# Patient Record
Sex: Female | Born: 1955
Health system: Southern US, Community
[De-identification: ages and names within clinical notes are randomized; demographics above are authoritative.]

## PROBLEM LIST (undated history)

## (undated) DIAGNOSIS — I10 Essential (primary) hypertension: Secondary | ICD-10-CM

## (undated) DIAGNOSIS — E039 Hypothyroidism, unspecified: Secondary | ICD-10-CM

## (undated) DIAGNOSIS — H269 Unspecified cataract: Secondary | ICD-10-CM

## (undated) DIAGNOSIS — F419 Anxiety disorder, unspecified: Secondary | ICD-10-CM

## (undated) DIAGNOSIS — M1990 Unspecified osteoarthritis, unspecified site: Secondary | ICD-10-CM

## (undated) DIAGNOSIS — E079 Disorder of thyroid, unspecified: Secondary | ICD-10-CM

## (undated) DIAGNOSIS — C801 Malignant (primary) neoplasm, unspecified: Secondary | ICD-10-CM

## (undated) DIAGNOSIS — I480 Paroxysmal atrial fibrillation: Secondary | ICD-10-CM

## (undated) DIAGNOSIS — Z975 Presence of (intrauterine) contraceptive device: Secondary | ICD-10-CM

## (undated) DIAGNOSIS — K5090 Crohn's disease, unspecified, without complications: Secondary | ICD-10-CM

## (undated) DIAGNOSIS — K219 Gastro-esophageal reflux disease without esophagitis: Secondary | ICD-10-CM

## (undated) DIAGNOSIS — N6092 Unspecified benign mammary dysplasia of left breast: Secondary | ICD-10-CM

## (undated) DIAGNOSIS — D649 Anemia, unspecified: Secondary | ICD-10-CM

## (undated) HISTORY — DX: Essential (primary) hypertension: I10

## (undated) HISTORY — PX: COLONOSCOPY: SHX174

## (undated) HISTORY — DX: Anemia, unspecified: D64.9

## (undated) HISTORY — DX: Presence of (intrauterine) contraceptive device: Z97.5

## (undated) HISTORY — DX: Unspecified benign mammary dysplasia of left breast: N60.92

## (undated) HISTORY — DX: Crohn's disease, unspecified, without complications: K50.90

## (undated) HISTORY — DX: Disorder of thyroid, unspecified: E07.9

## (undated) HISTORY — PX: OTHER SURGICAL HISTORY: SHX169

## (undated) HISTORY — DX: Malignant (primary) neoplasm, unspecified: C80.1

## (undated) HISTORY — DX: Unspecified cataract: H26.9

## (undated) HISTORY — DX: Gastro-esophageal reflux disease without esophagitis: K21.9

## (undated) HISTORY — DX: Paroxysmal atrial fibrillation: I48.0

## (undated) HISTORY — DX: Unspecified osteoarthritis, unspecified site: M19.90

## (undated) HISTORY — DX: Anxiety disorder, unspecified: F41.9

---

## 1981-02-25 DIAGNOSIS — Z975 Presence of (intrauterine) contraceptive device: Secondary | ICD-10-CM

## 1981-02-25 HISTORY — DX: Presence of (intrauterine) contraceptive device: Z97.5

## 1997-08-19 ENCOUNTER — Other Ambulatory Visit: Admission: RE | Admit: 1997-08-19 | Discharge: 1997-08-19 | Payer: Self-pay | Admitting: Obstetrics and Gynecology

## 1997-08-26 ENCOUNTER — Ambulatory Visit (HOSPITAL_COMMUNITY): Admission: RE | Admit: 1997-08-26 | Discharge: 1997-08-26 | Payer: Self-pay | Admitting: Obstetrics and Gynecology

## 1998-08-17 ENCOUNTER — Ambulatory Visit (HOSPITAL_COMMUNITY): Admission: RE | Admit: 1998-08-17 | Discharge: 1998-08-17 | Payer: Self-pay | Admitting: Obstetrics and Gynecology

## 1998-10-24 ENCOUNTER — Other Ambulatory Visit: Admission: RE | Admit: 1998-10-24 | Discharge: 1998-10-24 | Payer: Self-pay | Admitting: Obstetrics and Gynecology

## 1999-08-20 ENCOUNTER — Ambulatory Visit (HOSPITAL_COMMUNITY): Admission: RE | Admit: 1999-08-20 | Discharge: 1999-08-20 | Payer: Self-pay | Admitting: Obstetrics and Gynecology

## 1999-08-20 ENCOUNTER — Encounter: Payer: Self-pay | Admitting: Obstetrics and Gynecology

## 1999-12-20 ENCOUNTER — Other Ambulatory Visit: Admission: RE | Admit: 1999-12-20 | Discharge: 1999-12-20 | Payer: Self-pay | Admitting: Obstetrics and Gynecology

## 2000-03-07 ENCOUNTER — Emergency Department (HOSPITAL_COMMUNITY): Admission: EM | Admit: 2000-03-07 | Discharge: 2000-03-07 | Payer: Self-pay | Admitting: Emergency Medicine

## 2000-08-21 ENCOUNTER — Ambulatory Visit (HOSPITAL_COMMUNITY): Admission: RE | Admit: 2000-08-21 | Discharge: 2000-08-21 | Payer: Self-pay | Admitting: Obstetrics and Gynecology

## 2000-08-21 ENCOUNTER — Encounter: Payer: Self-pay | Admitting: Obstetrics and Gynecology

## 2001-01-20 ENCOUNTER — Other Ambulatory Visit: Admission: RE | Admit: 2001-01-20 | Discharge: 2001-01-20 | Payer: Self-pay | Admitting: Obstetrics and Gynecology

## 2001-08-24 ENCOUNTER — Encounter: Payer: Self-pay | Admitting: Obstetrics and Gynecology

## 2001-08-24 ENCOUNTER — Ambulatory Visit (HOSPITAL_COMMUNITY): Admission: RE | Admit: 2001-08-24 | Discharge: 2001-08-24 | Payer: Self-pay | Admitting: Obstetrics and Gynecology

## 2002-02-10 ENCOUNTER — Encounter: Payer: Self-pay | Admitting: Obstetrics and Gynecology

## 2002-02-10 ENCOUNTER — Encounter: Admission: RE | Admit: 2002-02-10 | Discharge: 2002-02-10 | Payer: Self-pay | Admitting: Obstetrics and Gynecology

## 2002-04-22 ENCOUNTER — Other Ambulatory Visit: Admission: RE | Admit: 2002-04-22 | Discharge: 2002-04-22 | Payer: Self-pay | Admitting: Obstetrics and Gynecology

## 2002-09-07 ENCOUNTER — Encounter: Payer: Self-pay | Admitting: Obstetrics and Gynecology

## 2002-09-07 ENCOUNTER — Ambulatory Visit (HOSPITAL_COMMUNITY): Admission: RE | Admit: 2002-09-07 | Discharge: 2002-09-07 | Payer: Self-pay | Admitting: Obstetrics and Gynecology

## 2002-09-08 ENCOUNTER — Encounter: Payer: Self-pay | Admitting: Obstetrics and Gynecology

## 2002-09-08 ENCOUNTER — Encounter: Admission: RE | Admit: 2002-09-08 | Discharge: 2002-09-08 | Payer: Self-pay | Admitting: Obstetrics and Gynecology

## 2003-05-03 ENCOUNTER — Other Ambulatory Visit: Admission: RE | Admit: 2003-05-03 | Discharge: 2003-05-03 | Payer: Self-pay | Admitting: Obstetrics and Gynecology

## 2003-05-26 ENCOUNTER — Encounter (INDEPENDENT_AMBULATORY_CARE_PROVIDER_SITE_OTHER): Payer: Self-pay | Admitting: Specialist

## 2003-05-26 ENCOUNTER — Ambulatory Visit (HOSPITAL_COMMUNITY): Admission: RE | Admit: 2003-05-26 | Discharge: 2003-05-26 | Payer: Self-pay | Admitting: Gastroenterology

## 2003-09-09 ENCOUNTER — Ambulatory Visit (HOSPITAL_COMMUNITY): Admission: RE | Admit: 2003-09-09 | Discharge: 2003-09-09 | Payer: Self-pay | Admitting: Obstetrics and Gynecology

## 2004-05-11 ENCOUNTER — Other Ambulatory Visit: Admission: RE | Admit: 2004-05-11 | Discharge: 2004-05-11 | Payer: Self-pay | Admitting: Obstetrics and Gynecology

## 2004-09-10 ENCOUNTER — Ambulatory Visit (HOSPITAL_COMMUNITY): Admission: RE | Admit: 2004-09-10 | Discharge: 2004-09-10 | Payer: Self-pay | Admitting: Obstetrics and Gynecology

## 2005-06-13 ENCOUNTER — Other Ambulatory Visit: Admission: RE | Admit: 2005-06-13 | Discharge: 2005-06-13 | Payer: Self-pay | Admitting: Obstetrics and Gynecology

## 2005-08-29 ENCOUNTER — Encounter: Admission: RE | Admit: 2005-08-29 | Discharge: 2005-08-29 | Payer: Self-pay | Admitting: Internal Medicine

## 2005-08-29 ENCOUNTER — Encounter (INDEPENDENT_AMBULATORY_CARE_PROVIDER_SITE_OTHER): Payer: Self-pay | Admitting: Specialist

## 2005-08-29 ENCOUNTER — Other Ambulatory Visit: Admission: RE | Admit: 2005-08-29 | Discharge: 2005-08-29 | Payer: Self-pay | Admitting: Interventional Radiology

## 2005-09-05 ENCOUNTER — Encounter (HOSPITAL_COMMUNITY): Admission: AD | Admit: 2005-09-05 | Discharge: 2005-11-06 | Payer: Self-pay | Admitting: Internal Medicine

## 2005-10-08 ENCOUNTER — Ambulatory Visit (HOSPITAL_COMMUNITY): Admission: RE | Admit: 2005-10-08 | Discharge: 2005-10-08 | Payer: Self-pay | Admitting: Obstetrics and Gynecology

## 2006-03-11 ENCOUNTER — Ambulatory Visit (HOSPITAL_COMMUNITY): Admission: RE | Admit: 2006-03-11 | Discharge: 2006-03-11 | Payer: Self-pay | Admitting: Surgery

## 2006-06-17 ENCOUNTER — Other Ambulatory Visit: Admission: RE | Admit: 2006-06-17 | Discharge: 2006-06-17 | Payer: Self-pay | Admitting: Obstetrics and Gynecology

## 2006-10-13 ENCOUNTER — Ambulatory Visit (HOSPITAL_COMMUNITY): Admission: RE | Admit: 2006-10-13 | Discharge: 2006-10-13 | Payer: Self-pay | Admitting: Obstetrics and Gynecology

## 2006-10-19 ENCOUNTER — Emergency Department (HOSPITAL_COMMUNITY): Admission: EM | Admit: 2006-10-19 | Discharge: 2006-10-19 | Payer: Self-pay | Admitting: Emergency Medicine

## 2007-06-05 ENCOUNTER — Emergency Department (HOSPITAL_COMMUNITY): Admission: EM | Admit: 2007-06-05 | Discharge: 2007-06-05 | Payer: Self-pay | Admitting: Emergency Medicine

## 2007-06-15 ENCOUNTER — Encounter: Admission: RE | Admit: 2007-06-15 | Discharge: 2007-06-15 | Payer: Self-pay | Admitting: Orthopedic Surgery

## 2007-06-22 ENCOUNTER — Other Ambulatory Visit: Admission: RE | Admit: 2007-06-22 | Discharge: 2007-06-22 | Payer: Self-pay | Admitting: Obstetrics and Gynecology

## 2007-10-14 ENCOUNTER — Ambulatory Visit (HOSPITAL_COMMUNITY): Admission: RE | Admit: 2007-10-14 | Discharge: 2007-10-14 | Payer: Self-pay | Admitting: Obstetrics and Gynecology

## 2007-11-05 ENCOUNTER — Ambulatory Visit: Payer: Self-pay | Admitting: Obstetrics and Gynecology

## 2007-11-17 ENCOUNTER — Encounter: Admission: RE | Admit: 2007-11-17 | Discharge: 2007-11-17 | Payer: Self-pay | Admitting: Obstetrics and Gynecology

## 2008-01-29 ENCOUNTER — Ambulatory Visit: Payer: Self-pay | Admitting: Obstetrics and Gynecology

## 2008-06-23 ENCOUNTER — Ambulatory Visit: Payer: Self-pay | Admitting: Obstetrics and Gynecology

## 2008-06-23 ENCOUNTER — Other Ambulatory Visit: Admission: RE | Admit: 2008-06-23 | Discharge: 2008-06-23 | Payer: Self-pay | Admitting: Obstetrics and Gynecology

## 2008-06-23 ENCOUNTER — Encounter: Payer: Self-pay | Admitting: Obstetrics and Gynecology

## 2008-10-19 ENCOUNTER — Ambulatory Visit (HOSPITAL_COMMUNITY): Admission: RE | Admit: 2008-10-19 | Discharge: 2008-10-19 | Payer: Self-pay | Admitting: Obstetrics and Gynecology

## 2009-07-05 ENCOUNTER — Ambulatory Visit: Payer: Self-pay | Admitting: Obstetrics and Gynecology

## 2009-07-05 ENCOUNTER — Other Ambulatory Visit: Admission: RE | Admit: 2009-07-05 | Discharge: 2009-07-05 | Payer: Self-pay | Admitting: Obstetrics and Gynecology

## 2009-11-01 ENCOUNTER — Ambulatory Visit (HOSPITAL_COMMUNITY): Admission: RE | Admit: 2009-11-01 | Discharge: 2009-11-01 | Payer: Self-pay | Admitting: Obstetrics and Gynecology

## 2010-07-13 NOTE — Op Note (Signed)
NAME:  Robin [name], Robin [name]                          ACCOUNT NO.:  [id number]   MEDICAL RECORD NO.:  [id number]                   PATIENT TYPE:  AMB   LOCATION:  ENDO                                 FACILITY:  [facility]   PHYSICIAN:  Earle Gell, M.D.                DATE OF BIRTH:  1955-06-17   DATE OF PROCEDURE:  05/26/2003  DATE OF DISCHARGE:                                 OPERATIVE REPORT   PROCEDURE:  Screening colonoscopy.   PROCEDURE INDICATION:  Ms. Tehila Sokolow has chronic Crohn's ileocolitis  which is symptomatically in remission.  Her last colonoscopy was in 1994,  revealing generalized Crohn's colitis and distal ileitis.   ENDOSCOPIST:  Garlan Fair, M.D.   PREMEDICATION:  1. Versed 10 mg.  2. Demerol 70 mcg.   DESCRIPTION OF PROCEDURE:  After obtaining informed consent, Ms. Wynetta Emery was  placed in the left lateral decubitus position.  I administered intravenous  Demerol and intravenous Versed to achieve conscious sedation for the  procedure.  The patient's blood pressure, oxygen saturation, and cardiac  rhythm were monitored throughout the procedure and documented in the medical  record.   Anal inspection and digital rectal exam were normal.  The Olympus adjustable  pediatric colonoscope was introduced into the rectum and advanced to the  cecum.  The ileocecal valve was intubated and the distal ileum inspected.  Colonic preparation for the exam today was excellent.   Mucosa from the rectum to the hepatic flexure is unremarkable.  There are no  neoplastic-appearing lesions.  The mucosa has the appearance of chronic,  inactive Crohn's colitis.   There are scattered aphthous ulcers involving the ascending colon and cecum  which is mildly inflamed at best.  The ileocecal valve was normal and widely  patent.  The distal ileum appears normal.   Surveillance biopsies.  Approximately 30 biopsies were taken along the  length of the colon and rectum and sent to pathologist  to rule out  dysplasia.                                               Earle Gell, M.D.   MJ/MEDQ  D:  05/26/2003  T:  05/26/2003  Job:  [id number]   cc:   Nehemiah Settle, M.D.  [address]  [place] [address]  Fax: [phone]

## 2010-07-20 ENCOUNTER — Encounter (INDEPENDENT_AMBULATORY_CARE_PROVIDER_SITE_OTHER): Payer: Self-pay | Admitting: Surgery

## 2010-07-25 ENCOUNTER — Encounter (INDEPENDENT_AMBULATORY_CARE_PROVIDER_SITE_OTHER): Payer: BC Managed Care – PPO | Admitting: Obstetrics and Gynecology

## 2010-07-25 ENCOUNTER — Other Ambulatory Visit (HOSPITAL_COMMUNITY)
Admission: RE | Admit: 2010-07-25 | Discharge: 2010-07-25 | Disposition: A | Discharge status: Home / Self Care | Payer: BC Managed Care – PPO | Source: Ambulatory Visit | Attending: Obstetrics and Gynecology | Admitting: Obstetrics and Gynecology

## 2010-07-25 ENCOUNTER — Other Ambulatory Visit: Payer: Self-pay | Admitting: Obstetrics and Gynecology

## 2010-07-25 DIAGNOSIS — Z124 Encounter for screening for malignant neoplasm of cervix: Secondary | ICD-10-CM | POA: Insufficient documentation

## 2010-07-25 DIAGNOSIS — Z01419 Encounter for gynecological examination (general) (routine) without abnormal findings: Secondary | ICD-10-CM

## 2010-08-15 ENCOUNTER — Other Ambulatory Visit (INDEPENDENT_AMBULATORY_CARE_PROVIDER_SITE_OTHER): Payer: BC Managed Care – PPO

## 2010-08-15 DIAGNOSIS — M810 Age-related osteoporosis without current pathological fracture: Secondary | ICD-10-CM

## 2010-08-31 ENCOUNTER — Other Ambulatory Visit: Payer: BC Managed Care – PPO

## 2010-10-02 ENCOUNTER — Other Ambulatory Visit: Payer: Self-pay | Admitting: Obstetrics and Gynecology

## 2010-10-02 DIAGNOSIS — Z1231 Encounter for screening mammogram for malignant neoplasm of breast: Secondary | ICD-10-CM

## 2010-11-28 ENCOUNTER — Ambulatory Visit (HOSPITAL_COMMUNITY)
Admission: RE | Admit: 2010-11-28 | Discharge: 2010-11-28 | Disposition: A | Discharge status: Home / Self Care | Payer: BC Managed Care – PPO | Source: Ambulatory Visit | Attending: Obstetrics and Gynecology | Admitting: Obstetrics and Gynecology

## 2010-11-28 DIAGNOSIS — Z1231 Encounter for screening mammogram for malignant neoplasm of breast: Secondary | ICD-10-CM | POA: Insufficient documentation

## 2011-02-21 ENCOUNTER — Other Ambulatory Visit: Payer: Self-pay | Admitting: *Deleted

## 2011-02-21 DIAGNOSIS — M810 Age-related osteoporosis without current pathological fracture: Secondary | ICD-10-CM

## 2011-02-21 MED ORDER — DENOSUMAB 60 MG/ML ~~LOC~~ SOLN
60.0000 mg | Freq: Once | SUBCUTANEOUS | Status: AC
  Administered 2011-02-22: 60 mg via SUBCUTANEOUS

## 2011-02-22 ENCOUNTER — Ambulatory Visit (INDEPENDENT_AMBULATORY_CARE_PROVIDER_SITE_OTHER): Payer: BC Managed Care – PPO | Admitting: Anesthesiology

## 2011-02-22 DIAGNOSIS — M810 Age-related osteoporosis without current pathological fracture: Secondary | ICD-10-CM

## 2011-02-27 ENCOUNTER — Other Ambulatory Visit (INDEPENDENT_AMBULATORY_CARE_PROVIDER_SITE_OTHER): Payer: Self-pay | Admitting: Surgery

## 2011-02-27 DIAGNOSIS — E042 Nontoxic multinodular goiter: Secondary | ICD-10-CM

## 2011-07-19 ENCOUNTER — Encounter: Payer: Self-pay | Admitting: Gynecology

## 2011-07-19 DIAGNOSIS — M810 Age-related osteoporosis without current pathological fracture: Secondary | ICD-10-CM | POA: Insufficient documentation

## 2011-07-29 ENCOUNTER — Ambulatory Visit (INDEPENDENT_AMBULATORY_CARE_PROVIDER_SITE_OTHER): Payer: BC Managed Care – PPO | Admitting: Obstetrics and Gynecology

## 2011-07-29 ENCOUNTER — Encounter: Payer: Self-pay | Admitting: Obstetrics and Gynecology

## 2011-07-29 VITALS — BP 122/78 | Ht 65.5 in | Wt 150.0 lb

## 2011-07-29 DIAGNOSIS — Z01419 Encounter for gynecological examination (general) (routine) without abnormal findings: Secondary | ICD-10-CM

## 2011-07-29 DIAGNOSIS — D649 Anemia, unspecified: Secondary | ICD-10-CM

## 2011-07-29 LAB — LIPID PANEL
Cholesterol: 129 mg/dL (ref 0–200)
Total CHOL/HDL Ratio: 2.6 Ratio
Triglycerides: 79 mg/dL (ref <150)
VLDL: 16 mg/dL (ref 0–40)

## 2011-07-29 LAB — CBC WITH DIFFERENTIAL/PLATELET
Basophils Relative: 1 % (ref 0–1)
Hemoglobin: 11.4 g/dL — ABNORMAL LOW (ref 12.0–15.0)
Lymphs Abs: 2.8 10*3/uL (ref 0.7–4.0)
Monocytes Relative: 8 % (ref 3–12)
Neutro Abs: 4.9 10*3/uL (ref 1.7–7.7)
Neutrophils Relative %: 56 % (ref 43–77)
RBC: 4.11 MIL/uL (ref 3.87–5.11)
WBC: 8.8 10*3/uL (ref 4.0–10.5)

## 2011-07-29 NOTE — Progress Notes (Signed)
Patient came to see me today for her annual GYN exam. She now has had 2 Prolia injections without side effects except for possibly a skin rash. She will discuss it with her dermatologist. She is doing well in terms of vaginal dryness without her estrogen cream. Her husband however is having trouble with the ED. She is having no vaginal bleeding. She is having no pelvic pain. She is having no bladder problems. She is up-to-date on her mammograms and colonoscopy.  Physical examination: Robin [name] present. HEENT within normal limits. Neck: Thyroid not large. No masses. Supraclavicular nodes: not enlarged. Breasts: Examined in both sitting and lying  position. No skin changes and no masses. Abdomen: Soft no guarding rebound or masses or hernia. Pelvic: External: Within normal limits. BUS: Within normal limits. Vaginal:within normal limits. Good estrogen effect. No evidence of cystocele rectocele or enterocele. Cervix: clean. Uterus: Normal size and shape. Adnexa: No masses. Rectovaginal exam: Confirmatory and negative. Extremities: Within normal limits.  Assessment: Osteoporosis  Plan: Continue Prolia. 4 months after next Prolia return for bone density. Continue yearly mammograms.

## 2011-07-30 LAB — URINALYSIS W MICROSCOPIC + REFLEX CULTURE
Bilirubin Urine: NEGATIVE
Glucose, UA: NEGATIVE mg/dL
Hgb urine dipstick: NEGATIVE
Nitrite: NEGATIVE
Protein, ur: NEGATIVE mg/dL
Squamous Epithelial / LPF: NONE SEEN

## 2011-07-30 NOTE — Progress Notes (Signed)
Addended by: Fredric Mare on: 07/30/2011 11:42 AM   Modules accepted: Orders

## 2011-07-31 ENCOUNTER — Other Ambulatory Visit: Payer: BC Managed Care – PPO

## 2011-07-31 ENCOUNTER — Telehealth: Payer: Self-pay | Admitting: *Deleted

## 2011-07-31 DIAGNOSIS — D649 Anemia, unspecified: Secondary | ICD-10-CM

## 2011-07-31 NOTE — Telephone Encounter (Signed)
Lm for patient to call.  Prolia $25 copay due after 08/23/11.

## 2011-08-01 ENCOUNTER — Other Ambulatory Visit: Payer: Self-pay | Admitting: *Deleted

## 2011-08-01 DIAGNOSIS — M810 Age-related osteoporosis without current pathological fracture: Secondary | ICD-10-CM

## 2011-08-01 LAB — CBC WITH DIFFERENTIAL/PLATELET
Basophils Relative: 1 % (ref 0–1)
HCT: 37.7 % (ref 36.0–46.0)
Hemoglobin: 12 g/dL (ref 12.0–15.0)
Lymphs Abs: 2.4 10*3/uL (ref 0.7–4.0)
MCHC: 31.8 g/dL (ref 30.0–36.0)
Monocytes Absolute: 0.6 10*3/uL (ref 0.1–1.0)
Monocytes Relative: 8 % (ref 3–12)
Neutro Abs: 4.3 10*3/uL (ref 1.7–7.7)
RBC: 4.27 MIL/uL (ref 3.87–5.11)

## 2011-08-01 LAB — VITAMIN B6

## 2011-08-01 LAB — FERRITIN: Ferritin: 60 ng/mL (ref 10–291)

## 2011-08-01 MED ORDER — DENOSUMAB 60 MG/ML ~~LOC~~ SOLN
60.0000 mg | Freq: Once | SUBCUTANEOUS | Status: AC
  Administered 2011-08-26: 60 mg via SUBCUTANEOUS

## 2011-08-01 NOTE — Telephone Encounter (Signed)
Patient informed. Will come for injection first week of July.

## 2011-08-02 ENCOUNTER — Telehealth: Payer: Self-pay | Admitting: *Deleted

## 2011-08-02 DIAGNOSIS — D649 Anemia, unspecified: Secondary | ICD-10-CM

## 2011-08-02 NOTE — Telephone Encounter (Signed)
Pt informed to have vitamin b6 lab drawn again due to not enough blood drawn first time per lab, pt will come on Tuesday 08/06/11 @ 8:30 am, order placed in computer.

## 2011-08-05 ENCOUNTER — Other Ambulatory Visit: Payer: BC Managed Care – PPO

## 2011-08-05 ENCOUNTER — Other Ambulatory Visit: Payer: Self-pay | Admitting: Dermatology

## 2011-08-06 ENCOUNTER — Other Ambulatory Visit: Payer: BC Managed Care – PPO

## 2011-08-07 ENCOUNTER — Other Ambulatory Visit: Payer: Self-pay | Admitting: *Deleted

## 2011-08-07 DIAGNOSIS — N390 Urinary tract infection, site not specified: Secondary | ICD-10-CM

## 2011-08-07 MED ORDER — NITROFURANTOIN MONOHYD MACRO 100 MG PO CAPS: 100.0000 mg | ORAL_CAPSULE | Freq: Two times a day (BID) | ORAL | Status: AC

## 2011-08-26 ENCOUNTER — Ambulatory Visit (INDEPENDENT_AMBULATORY_CARE_PROVIDER_SITE_OTHER): Payer: BC Managed Care – PPO | Admitting: Gynecology

## 2011-08-26 ENCOUNTER — Other Ambulatory Visit: Payer: BC Managed Care – PPO

## 2011-08-26 DIAGNOSIS — M810 Age-related osteoporosis without current pathological fracture: Secondary | ICD-10-CM

## 2011-08-26 DIAGNOSIS — N390 Urinary tract infection, site not specified: Secondary | ICD-10-CM

## 2011-08-27 LAB — URINALYSIS W MICROSCOPIC + REFLEX CULTURE
Bacteria, UA: NONE SEEN
Crystals: NONE SEEN
Hgb urine dipstick: NEGATIVE
Ketones, ur: NEGATIVE mg/dL
Nitrite: NEGATIVE
Protein, ur: NEGATIVE mg/dL
Specific Gravity, Urine: 1.022 (ref 1.005–1.030)
Urobilinogen, UA: 0.2 mg/dL (ref 0.0–1.0)

## 2011-08-29 LAB — URINE CULTURE: Colony Count: 10000

## 2011-08-30 ENCOUNTER — Other Ambulatory Visit: Payer: Self-pay | Admitting: *Deleted

## 2011-08-30 DIAGNOSIS — R3129 Other microscopic hematuria: Secondary | ICD-10-CM

## 2011-09-19 ENCOUNTER — Encounter (INDEPENDENT_AMBULATORY_CARE_PROVIDER_SITE_OTHER): Payer: Self-pay

## 2011-09-30 ENCOUNTER — Other Ambulatory Visit: Payer: BC Managed Care – PPO

## 2011-09-30 DIAGNOSIS — R3129 Other microscopic hematuria: Secondary | ICD-10-CM

## 2011-10-01 LAB — URINALYSIS W MICROSCOPIC + REFLEX CULTURE
Nitrite: NEGATIVE
Protein, ur: NEGATIVE mg/dL
Urobilinogen, UA: 0.2 mg/dL (ref 0.0–1.0)

## 2011-10-02 LAB — URINE CULTURE
Colony Count: NO GROWTH
Organism ID, Bacteria: NO GROWTH

## 2011-11-04 ENCOUNTER — Other Ambulatory Visit (INDEPENDENT_AMBULATORY_CARE_PROVIDER_SITE_OTHER): Payer: Self-pay | Admitting: Surgery

## 2011-11-05 ENCOUNTER — Telehealth (INDEPENDENT_AMBULATORY_CARE_PROVIDER_SITE_OTHER): Payer: Self-pay

## 2011-11-05 ENCOUNTER — Other Ambulatory Visit (INDEPENDENT_AMBULATORY_CARE_PROVIDER_SITE_OTHER): Payer: Self-pay

## 2011-11-05 DIAGNOSIS — E041 Nontoxic single thyroid nodule: Secondary | ICD-10-CM

## 2011-11-05 NOTE — Telephone Encounter (Signed)
LMOM received request for refill. Pt due for labs and TSH if not done. Last seen June 2012. Orders in epic.

## 2011-11-07 ENCOUNTER — Telehealth (INDEPENDENT_AMBULATORY_CARE_PROVIDER_SITE_OTHER): Payer: Self-pay

## 2011-11-07 NOTE — Telephone Encounter (Signed)
Refill synthroid authorized and set by electronic refill.

## 2011-11-15 ENCOUNTER — Other Ambulatory Visit: Payer: Self-pay | Admitting: Obstetrics and Gynecology

## 2011-11-15 DIAGNOSIS — Z1231 Encounter for screening mammogram for malignant neoplasm of breast: Secondary | ICD-10-CM

## 2011-11-25 ENCOUNTER — Ambulatory Visit
Admission: RE | Admit: 2011-11-25 | Discharge: 2011-11-25 | Disposition: A | Discharge status: Home / Self Care | Payer: BC Managed Care – PPO | Source: Ambulatory Visit | Attending: Surgery | Admitting: Surgery

## 2011-11-25 DIAGNOSIS — E042 Nontoxic multinodular goiter: Secondary | ICD-10-CM

## 2011-12-02 ENCOUNTER — Ambulatory Visit (INDEPENDENT_AMBULATORY_CARE_PROVIDER_SITE_OTHER): Payer: BC Managed Care – PPO | Admitting: Surgery

## 2011-12-02 ENCOUNTER — Encounter (INDEPENDENT_AMBULATORY_CARE_PROVIDER_SITE_OTHER): Payer: Self-pay | Admitting: Surgery

## 2011-12-02 VITALS — BP 120/72 | HR 84 | Temp 98.8°F | Resp 16 | Ht 65.0 in | Wt 140.8 lb

## 2011-12-02 DIAGNOSIS — E039 Hypothyroidism, unspecified: Secondary | ICD-10-CM | POA: Insufficient documentation

## 2011-12-02 DIAGNOSIS — D440 Neoplasm of uncertain behavior of thyroid gland: Secondary | ICD-10-CM | POA: Insufficient documentation

## 2011-12-02 DIAGNOSIS — D449 Neoplasm of uncertain behavior of unspecified endocrine gland: Secondary | ICD-10-CM

## 2011-12-02 NOTE — Patient Instructions (Signed)
Thyroid Diseases Your thyroid is a butterfly-shaped gland in your neck. It is located just above your collarbone. It is one of your endocrine glands, which make hormones. The thyroid helps set your metabolism. Metabolism is how your body gets energy from the foods you eat.  Millions of people have thyroid diseases. Women experience thyroid problems more often than men. In fact, overactive thyroid problems (hyperthyroidism) occur in 1% of all women. If you have a thyroid disease, your body may use energy more slowly or quickly than it should.  Thyroid problems also include an immune disease where your body reacts against your thyroid gland (called thyroiditis). A different problem involves lumps and bumps (called nodules) that develop in the gland. The nodules are usually, but not always, noncancerous. THE MOST COMMON THYROID PROBLEMS AND CAUSES ARE DISCUSSED BELOW There are many causes for thyroid problems. Treatment depends upon the exact diagnosis and includes trying to reset your body's metabolism to a normal rate. Hyperthyroidism Too much thyroid hormone from an overactive thyroid gland is called hyperthyroidism. In hyperthyroidism, the body's metabolism speeds up. One of the most frequent forms of hyperthyroidism is known as Graves' disease. Graves' disease tends to run in families. Although Graves' is thought to be caused by a problem with the immune system, the exact nature of the genetic problem is unknown. Hypothyroidism Too little thyroid hormone from an underactive thyroid gland is called hypothyroidism. In hypothyroidism, the body's metabolism is slowed. Several things can cause this condition. Most causes affect the thyroid gland directly and hurt its ability to make enough hormone.  Rarely, there may be a pituitary gland tumor (located near the base of the brain). The tumor can block the pituitary from producing thyroid-stimulating hormone (TSH). Your body makes TSH to stimulate the thyroid  to work properly. If the pituitary does not make enough TSH, the thyroid fails to make enough hormones needed for good health. Whether the problem is caused by thyroid conditions or by the pituitary gland, the result is that the thyroid is not making enough hormones. Hypothyroidism causes many physical and mental processes to become sluggish. The body consumes less oxygen and produces less body heat. Thyroid Nodules A thyroid nodule is a small swelling or lump in the thyroid gland. They are common. These nodules represent either a growth of thyroid tissue or a fluid-filled cyst. Both form a lump in the thyroid gland. Almost half of all people will have tiny thyroid nodules at some point in their lives. Typically, these are not noticeable until they become large and affect normal thyroid size. Larger nodules that are greater than a half inch across (about 1 centimeter) occur in about 5 percent of people. Although most nodules are not cancerous, people who have them should seek medical care to rule out cancer. Also, some thyroid nodules may produce too much thyroid hormone or become too large. Large nodules or a large gland can interfere with breathing or swallowing or may cause neck discomfort. Other problems Other thyroid problems include cancer and thyroiditis. Thyroiditis is a malfunction of the body's immune system. Normally, the immune system works to defend the body against infection and other problems. When the immune system is not working properly, it may mistakenly attack normal cells, tissues, and organs. Examples of autoimmune diseases are Hashimoto's thyroiditis (which causes low thyroid function) and Graves' disease (which causes excess thyroid function). SYMPTOMS  Symptoms vary greatly depending upon the exact type of problem with the thyroid. Hyperthyroidism-is when your thyroid is too  active and makes more thyroid hormone than your body needs. The most common cause is Graves' Disease. Too  much thyroid hormone can cause some or all of the following symptoms:  Anxiety.  Irritability.  Difficulty sleeping.  Fatigue.  A rapid or irregular heartbeat.  A fine tremor of your hands or fingers.  An increase in perspiration.  Sensitivity to heat.  Weight loss, despite normal food intake.  Brittle hair.  Enlargement of your thyroid gland (goiter).  Light menstrual periods.  Frequent bowel movements. Graves' disease can specifically cause eye and skin problems. The skin problems involve reddening and swelling of the skin, often on your shins and on the top of your feet. Eye problems can include the following:  Excess tearing and sensation of grit or sand in either or both eyes.  Reddened or inflamed eyes.  Widening of the space between your eyelids.  Swelling of the lids and tissues around the eyes.  Light sensitivity.  Ulcers on the cornea.  Double vision.  Limited eye movements.  Blurred or reduced vision. Hypothyroidism- is when your thyroid gland is not active enough. This is more common than hyperthyroidism. Symptoms can vary a lot depending of the severity of the hormone deficiency. Symptoms may develop over a long period of time and can include several of the following:  Fatigue.  Sluggishness.  Increased sensitivity to cold.  Constipation.  Pale, dry skin.  A puffy face.  Hoarse voice.  High blood cholesterol level.  Unexplained weight gain.  Muscle aches, tenderness and stiffness.  Pain, stiffness or swelling in your joints.  Muscle weakness.  Heavier than normal menstrual periods.  Brittle fingernails and hair.  Depression. Thyroid Nodules - most do not cause signs or symptoms. Occasionally, some may become so large that you can feel or even see the swelling at the base of your neck. You may realize a lump or swelling is there when you are shaving or putting on makeup. Men might become aware of a nodule when shirt collars  suddenly feel too tight. Some nodules produce too much thyroid hormone. This can produce the same symptoms as hyperthyroidism (see above). Thyroid nodules are seldom cancerous. However, a nodule is more likely to be malignant (cancerous) if it:  Grows quickly or feels hard.  Causes you to become hoarse or to have trouble swallowing or breathing.  Causes enlarged lymph nodes under your jaw or in your neck. DIAGNOSIS  Because there are so many possible thyroid conditions, your caregiver may ask for a number of tests. They will do this in order to narrow down the exact diagnosis. These tests can include:  Blood and antibody tests.  Special thyroid scans using small, safe amounts of radioactive iodine.  Ultrasound of the thyroid gland (particularly if there is a nodule or lump).  Biopsy. This is usually done with a special needle. A needle biopsy is a procedure to obtain a sample of cells from the thyroid. The tissue will be tested in a lab and examined under a microscope. TREATMENT  Treatment depends on the exact diagnosis. Hyperthyroidism  Beta-blockers help relieve many of the symptoms.  Anti-thyroid medications prevent the thyroid from making excess hormones.  Radioactive iodine treatment can destroy overactive thyroid cells. The iodine can permanently decrease the amount of hormone produced.  Surgery to remove the thyroid gland.  Treatments for eye problems that come from Graves' disease also include medications and special eye surgery, if felt to be appropriate. Hypothyroidism Thyroid replacement with levothyroxine is  the mainstay of treatment. Treatment with thyroid replacement is usually lifelong and will require monitoring and adjustment from time to time. Thyroid Nodules  Watchful waiting. If a small nodule causes no symptoms or signs of cancer on biopsy, then no treatment may be chosen at first. Re-exam and re-checking blood tests would be the recommended  follow-up.  Anti-thyroid medications or radioactive iodine treatment may be recommended if the nodules produce too much thyroid hormone (see Treatment for Hyperthyroidism above).  Alcohol ablation. Injections of small amounts of ethyl alcohol (ethanol) can cause a non-cancerous nodule to shrink in size.  Surgery (see Treatment for Hyperthyroidism above). HOME CARE INSTRUCTIONS   Take medications as instructed.  Follow through on recommended testing. SEEK MEDICAL CARE IF:   You feel that you are developing symptoms of Hyperthyroidism or Hypothyroidism as described above.  You develop a new lump/nodule in the neck/thyroid area that you had not noticed before.  You feel that you are having side effects from medicines prescribed.  You develop trouble breathing or swallowing. SEEK IMMEDIATE MEDICAL CARE IF:   You develop a fever of 102 F (38.9 C) or higher.  You develop severe sweating.  You develop palpitations and/or rapid heart beat.  You develop shortness of breath.  You develop nausea and vomiting.  You develop extreme shakiness.  You develop agitation.  You develop lightheadedness or have a fainting episode. Document Released: 12/09/2006 Document Revised: 05/06/2011 Document Reviewed: 12/09/2006 [facility] Patient Information 2013 [place].

## 2011-12-02 NOTE — Progress Notes (Signed)
General Surgery [facility]  Visit Diagnoses: 1. Neoplasm of uncertain behavior of thyroid gland, left lobe   2. Hypothyroidism     HISTORY: The patient is a 56 year old white female followed for many years with a dominant left thyroid nodule arising from a multinodular thyroid. She has undergone previous fine-needle aspiration biopsy which was benign.  At my request the patient underwent a followup thyroid ultrasound on 11/25/2011. This shows a dominant nodule in the left lobe measure 1.6 x 3.0 x 1.7 cm. This shows a minimal enlargement compared to her study in 2008 when the nodule measured 2.8 cm. There is cystic degeneration within the nodule. No calcifications are identified. Remainder of the thyroid shows small nodules all of which measure less than 1 cm in size.  Patient remains on Synthroid 75 mcg daily. A recent TSH level provided by her primary care physician is normal at 1.84.  PERTINENT REVIEW OF SYSTEMS: Denies tremor. Denies palpitation. Denies compressive symptoms. Denies any new masses or discomfort.  EXAM: HEENT: normocephalic; pupils equal and reactive; sclerae clear; dentition good; mucous membranes moist NECK:  Palpable soft nodule mid left thyroid lobe measuring approximately 2-3 cm in size; asymmetric on extension; no palpable anterior or posterior cervical lymphadenopathy; no supraclavicular masses; no tenderness CHEST: clear to auscultation bilaterally without rales, rhonchi, or wheezes CARDIAC: regular rate and rhythm without significant murmur; peripheral pulses are full EXT:  non-tender without edema; no deformity NEURO: no gross focal deficits; no sign of tremor   IMPRESSION: Dominant left thyroid nodule, 3.0 cm, clinically stable Hypothyroidism on Synthroid supplementation  PLAN: Patient and I discussed all of the above findings. At this point I see no reason for surgical intervention. We will continue to monitor the left thyroid nodule  with a followup ultrasound study of 1 year. I have reviewed her prescription for Synthroid for one year. We will see her back at that time for review of her ultrasound and TSH level.  Earnstine Regal, MD, [facility] Office: [phone]

## 2011-12-09 ENCOUNTER — Ambulatory Visit (HOSPITAL_COMMUNITY)
Admission: RE | Admit: 2011-12-09 | Discharge: 2011-12-09 | Disposition: A | Discharge status: Home / Self Care | Payer: BC Managed Care – PPO | Source: Ambulatory Visit | Attending: Obstetrics and Gynecology | Admitting: Obstetrics and Gynecology

## 2011-12-09 DIAGNOSIS — Z1231 Encounter for screening mammogram for malignant neoplasm of breast: Secondary | ICD-10-CM

## 2012-03-10 ENCOUNTER — Telehealth: Payer: Self-pay | Admitting: *Deleted

## 2012-03-10 NOTE — Telephone Encounter (Signed)
Pt informed that Prolia benefits given to patient. $25 copay. Apt 03/16/12 KW

## 2012-03-16 ENCOUNTER — Ambulatory Visit (INDEPENDENT_AMBULATORY_CARE_PROVIDER_SITE_OTHER): Payer: BC Managed Care – PPO | Admitting: Gynecology

## 2012-03-16 DIAGNOSIS — M810 Age-related osteoporosis without current pathological fracture: Secondary | ICD-10-CM

## 2012-03-16 MED ORDER — DENOSUMAB 60 MG/ML ~~LOC~~ SOLN
60.0000 mg | Freq: Once | SUBCUTANEOUS | Status: AC
  Administered 2012-03-16: 60 mg via SUBCUTANEOUS

## 2012-07-13 ENCOUNTER — Other Ambulatory Visit: Payer: Self-pay | Admitting: Dermatology

## 2012-09-21 ENCOUNTER — Encounter (INDEPENDENT_AMBULATORY_CARE_PROVIDER_SITE_OTHER): Payer: Self-pay

## 2012-09-22 ENCOUNTER — Telehealth: Payer: Self-pay | Admitting: *Deleted

## 2012-09-22 NOTE — Telephone Encounter (Signed)
I called pt time for Prolia. NO prior Auth required. Covered copay $25. Has AEX set for 8/18 with TF. She can receive shot that day if TF wants her to proceed with it. LM for pt to call back KW

## 2012-09-25 NOTE — Telephone Encounter (Signed)
Pt informed and its only a $25 copay has AEX scheduled and will get shot then. KW

## 2012-10-12 ENCOUNTER — Encounter: Payer: Self-pay | Admitting: Gynecology

## 2012-10-12 ENCOUNTER — Ambulatory Visit (INDEPENDENT_AMBULATORY_CARE_PROVIDER_SITE_OTHER): Payer: BC Managed Care – PPO | Admitting: Gynecology

## 2012-10-12 VITALS — BP 120/76 | Ht 65.0 in | Wt 142.0 lb

## 2012-10-12 DIAGNOSIS — M810 Age-related osteoporosis without current pathological fracture: Secondary | ICD-10-CM

## 2012-10-12 DIAGNOSIS — Z01419 Encounter for gynecological examination (general) (routine) without abnormal findings: Secondary | ICD-10-CM

## 2012-10-12 MED ORDER — DENOSUMAB 60 MG/ML ~~LOC~~ SOLN
60.0000 mg | Freq: Once | SUBCUTANEOUS | Status: AC
  Administered 2012-10-12: 60 mg via SUBCUTANEOUS

## 2012-10-12 NOTE — Addendum Note (Signed)
Addended by: Nelva Nay on: 10/12/2012 10:18 AM   Modules accepted: Orders

## 2012-10-12 NOTE — Patient Instructions (Signed)
Followup for bone density as scheduled. Otherwise followup in one year for annual exam.

## 2012-10-12 NOTE — Progress Notes (Signed)
Robin [name] June 02, 1955 [id number]        57 y.o.  G0P0 for annual exam.  Former patient of Dr. Cherylann Banas. Several issues noted below.  Past medical history,surgical history, medications, allergies, family history and social history were all reviewed and documented in the EPIC chart.  ROS:  Performed and pertinent positives and negatives are included in the history, assessment and plan .  Exam: [name] assistant Filed Vitals:   10/12/12 0846  BP: 120/76  Height: 5' 5"  (1.651 m)  Weight: 142 lb (64.411 kg)   General appearance  Normal Skin grossly normal Head/Neck normal with no cervical or supraclavicular adenopathy thyroid normal Lungs  clear Cardiac RR, without RMG Abdominal  soft, nontender, without masses, organomegaly or hernia Breasts  examined lying and sitting without masses, retractions, discharge or axillary adenopathy. Pelvic  Ext/BUS/vagina  normal with atrophic changes  Cervix  normal with atrophic changes  Uterus  anteverted, normal size, shape and contour, midline and mobile nontender   Adnexa  Without masses or tenderness    Anus and perineum  normal   Rectovaginal  normal sphincter tone without palpated masses or tenderness.    Assessment/Plan:  57 y.o. G0P0 female for annual exam.   1. Postmenopausal. Patient overall doing well without significant symptoms of hot flushes, night sweats, vaginal dryness or dyspareunia. Had thought about using vaginal estrogen in the past but never started this. She is comfortable with observation. She knows to report any vaginal bleeding. 2. IUD in place. Patient apparently has safety coil placed by Dr. Dorathy Kinsman 1983. Dr. Cherylann Banas attempted to retrieve under a paracervical block with cervical dilatation. He stated in his note that he could feel the IUD but unable to retrieve it. The plan was to leave it in place as long as she is not having any issues with this. I discussed the risks to include migration as well as infection. Given  that she is asymptomatic and overall doing well she is comfortable with observation and we'll plan expectant management. 3. Osteoporosis. DEXA 2008 with T score -2.6. Apparently had vertebral fracture with a fall over the last year or 2. Was started on Prolia by Dr. Cherylann Banas 2012 and she is into her second year. Risks of treatment to include osteonecrosis of the jaw, atypical fractures, rashes, infections all reviewed. Recommended she continue for now and get baseline DEXA now. Plan repeat DEXA in another 2-years and at some point consider drug-free holiday. Increase calcium and vitamin D reviewed. 4. Pap smear 2012. No Pap smear done today. No history of abnormal Pap smears previously. Plan repeat Pap smear next year at 3 year interval. 5. Mammography 11/2011. Patient knows to repeat mammogram this coming October. SBE monthly reviewed. 6. Colonoscopy 2012. Repeat at their recommended interval. 7. Health maintenance. No blood work done. She sees Dr. Maxwell Caul routinely and I recommended that he do all of her routine blood work and she agrees with this. Followup for DEXA otherwise one year, sooner as needed.  Note: This document was prepared with digital dictation and possible smart phrase technology. Any transcriptional errors that result from this process are unintentional.   Anastasio Auerbach MD, 9:33 AM 10/12/2012

## 2012-10-13 ENCOUNTER — Encounter: Payer: Self-pay | Admitting: Obstetrics and Gynecology

## 2012-10-13 LAB — URINALYSIS W MICROSCOPIC + REFLEX CULTURE
Crystals: NONE SEEN
Glucose, UA: NEGATIVE mg/dL
Leukocytes, UA: NEGATIVE
Protein, ur: NEGATIVE mg/dL
Specific Gravity, Urine: 1.01 (ref 1.005–1.030)
Squamous Epithelial / LPF: NONE SEEN
Urobilinogen, UA: 0.2 mg/dL (ref 0.0–1.0)

## 2012-12-07 ENCOUNTER — Other Ambulatory Visit (INDEPENDENT_AMBULATORY_CARE_PROVIDER_SITE_OTHER): Payer: Self-pay

## 2012-12-07 ENCOUNTER — Other Ambulatory Visit: Payer: Self-pay | Admitting: Gynecology

## 2012-12-07 DIAGNOSIS — Z1231 Encounter for screening mammogram for malignant neoplasm of breast: Secondary | ICD-10-CM

## 2012-12-07 DIAGNOSIS — E039 Hypothyroidism, unspecified: Secondary | ICD-10-CM

## 2012-12-07 MED ORDER — SYNTHROID 75 MCG PO TABS: 75.0000 ug | ORAL_TABLET | Freq: Every day | ORAL | Status: DC

## 2012-12-08 ENCOUNTER — Ambulatory Visit (INDEPENDENT_AMBULATORY_CARE_PROVIDER_SITE_OTHER): Payer: BC Managed Care – PPO

## 2012-12-08 ENCOUNTER — Encounter: Payer: Self-pay | Admitting: Gynecology

## 2012-12-08 DIAGNOSIS — M810 Age-related osteoporosis without current pathological fracture: Secondary | ICD-10-CM

## 2012-12-25 ENCOUNTER — Ambulatory Visit (HOSPITAL_COMMUNITY)
Admission: RE | Admit: 2012-12-25 | Discharge: 2012-12-25 | Disposition: A | Discharge status: Home / Self Care | Payer: BC Managed Care – PPO | Source: Ambulatory Visit | Attending: Gynecology | Admitting: Gynecology

## 2012-12-25 DIAGNOSIS — Z1231 Encounter for screening mammogram for malignant neoplasm of breast: Secondary | ICD-10-CM | POA: Insufficient documentation

## 2013-02-08 ENCOUNTER — Encounter (INDEPENDENT_AMBULATORY_CARE_PROVIDER_SITE_OTHER): Payer: Self-pay | Admitting: Surgery

## 2013-02-08 ENCOUNTER — Ambulatory Visit (INDEPENDENT_AMBULATORY_CARE_PROVIDER_SITE_OTHER): Payer: BC Managed Care – PPO | Admitting: Surgery

## 2013-02-08 ENCOUNTER — Encounter (INDEPENDENT_AMBULATORY_CARE_PROVIDER_SITE_OTHER): Payer: Self-pay

## 2013-02-08 ENCOUNTER — Telehealth (INDEPENDENT_AMBULATORY_CARE_PROVIDER_SITE_OTHER): Payer: Self-pay

## 2013-02-08 VITALS — BP 144/80 | HR 80 | Temp 98.6°F | Resp 16 | Ht 66.25 in | Wt 147.4 lb

## 2013-02-08 DIAGNOSIS — D449 Neoplasm of uncertain behavior of unspecified endocrine gland: Secondary | ICD-10-CM

## 2013-02-08 DIAGNOSIS — D440 Neoplasm of uncertain behavior of thyroid gland: Secondary | ICD-10-CM

## 2013-02-08 DIAGNOSIS — E039 Hypothyroidism, unspecified: Secondary | ICD-10-CM

## 2013-02-08 NOTE — Telephone Encounter (Signed)
Ultrasound scheduled and lab slip given. Order encounter to [facility].

## 2013-02-08 NOTE — Progress Notes (Signed)
General Surgery [facility]  Chief Complaint  Patient presents with  . Thyroid Nodule    long term follow-up exam    HISTORY: Patient is a 57 year old female followed for left-sided thyroid nodule. It has been a little over one year since her last evaluation. She continues to take Synthroid 75 mcg daily. She has not noted any change in self-examination.  PERTINENT REVIEW OF SYSTEMS: Denies tremors. Occasional palpitation. Denies compressive symptoms. Denies any new probable masses.  EXAM: HEENT: normocephalic; pupils equal and reactive; sclerae clear; dentition good; mucous membranes moist NECK:  Dominant nodule left thyroid lobe measuring approximately 3 cm in length, soft, mobile, nontender; right thyroid lobe without palpable dominant nodules; symmetric on extension; no palpable anterior or posterior cervical lymphadenopathy; no supraclavicular masses; no tenderness CHEST: clear to auscultation bilaterally without rales, rhonchi, or wheezes CARDIAC: regular rate and rhythm without significant murmur; peripheral pulses are full EXT:  non-tender without edema; no deformity NEURO: no gross focal deficits; no sign of tremor   IMPRESSION: Left thyroid nodule, clinically stable Hypothyroidism  PLAN: Patient will have a thyroid ultrasound performed and obtain a TSH level. We will contact her with the results. I will then read do her a prescription for Synthroid 75 mcg daily if her TSH level is between 1.0 and 2.0. If her ultrasound remains stable, I believe we can wait 2 years before we do a repeat examination.  We will contact the patient with her test results.  Earnstine Regal, MD, [facility] Office: [phone]  Visit Diagnoses: 1. Hypothyroidism   2. Neoplasm of uncertain behavior of thyroid gland, left lobe

## 2013-02-08 NOTE — Patient Instructions (Signed)
Thyroid-Stimulating Hormone The amount of thyroid-stimulating hormone (TSH) or thyrotropin can be measured from a sample of blood. The TSH level can help diagnose thyroid gland or pituitary gland disorders, or monitor treatment of hypothyroidism and hyperthyroidism. TSH is produced by the pituitary gland, a tiny organ located below the brain. The pituitary gland is part of the body's feedback system to maintain stable levels of thyroid hormones released into the blood. Thyroid hormones help control the rate at which the body uses energy. The pituitary gland monitors the level of thyroid hormones released by the thyroid gland. The thyroid gland is a small butterfly-shaped gland that lies flat against the windpipe. If the thyroid gland does not release enough thyroid hormones, the pituitary gland detects the reduced thyroid hormone levels. The pituitary gland then makes more TSH to trigger the thyroid gland to produce more thyroid hormones. This increase in TSH is an effort to return the low thyroid hormones to normal levels. The increased TSH level is caused by the low thyroid hormone levels of an underactive thyroid (hypothyroidism). Symptoms of hypothyroidism include menstrual irregularities in women, weight gain, dry skin, constipation, cold intolerance, and fatigue. Rarely, a high TSH level can indicate a problem with the pituitary gland. A high TSH level could also occur when there is an insufficient level of thyroid hormone medication in individuals receiving that medication. If the thyroid gland releases too much thyroid hormones, the pituitary gland detects the increased thyroid hormone levels. The pituitary gland then makes less TSH to slow the thyroid gland from producing thyroid hormones. This decrease in TSH is an effort to return the increased thyroid hormones to normal levels. The decreased TSH level is caused by the excess thyroid hormone levels of an overactive thyroid gland (hyperthyroidism).  Symptoms associated with hyperthyroidism include rapid heart rate, weight loss, nervousness, hand tremors, irritated eyes, and difficulty sleeping. Rarely, a low TSH level can indicate a problem with the pituitary gland. PREPARATION FOR TEST No specific preparation is required for this blood test. A blood sample is obtained from a needle placed in a vein in your arm or from pricking the heel of an infant.  NORMAL FINDINGS  Adult: 0.5-5 milli-international Units/L (0.5-5 mIU/L)  Newborn: 3-20 milli-international Units/L (3-20 mIU/L)  Cord: 3-12 milli-international Units/L (3-12 mIU/L) Ranges for normal findings may vary among different laboratories and hospitals. You should always check with your doctor after having lab work or other tests done to discuss the meaning of your test results and whether your values are considered within normal limits. MEANING OF TEST  Your caregiver will go over the test results with you and discuss the importance and meaning of your results, as well as treatment options and the need for additional tests if necessary. OBTAINING THE TEST RESULTS It is your responsibility to obtain your test results. Ask the lab or department performing the test when and how you will get your results. Document Released: 03/08/2004 Document Revised: 05/06/2011 Document Reviewed: 01/24/2008 [facility] Patient Information 2014 [place], [place].

## 2013-02-10 ENCOUNTER — Other Ambulatory Visit: Payer: BC Managed Care – PPO

## 2013-02-12 ENCOUNTER — Ambulatory Visit
Admission: RE | Admit: 2013-02-12 | Discharge: 2013-02-12 | Disposition: A | Discharge status: Home / Self Care | Payer: BC Managed Care – PPO | Source: Ambulatory Visit | Attending: Surgery | Admitting: Surgery

## 2013-02-12 DIAGNOSIS — D440 Neoplasm of uncertain behavior of thyroid gland: Secondary | ICD-10-CM

## 2013-02-12 DIAGNOSIS — E039 Hypothyroidism, unspecified: Secondary | ICD-10-CM

## 2013-02-12 LAB — TSH: TSH: 1.907 u[IU]/mL (ref 0.350–4.500)

## 2013-02-21 ENCOUNTER — Other Ambulatory Visit (INDEPENDENT_AMBULATORY_CARE_PROVIDER_SITE_OTHER): Payer: Self-pay | Admitting: Surgery

## 2013-02-21 DIAGNOSIS — E039 Hypothyroidism, unspecified: Secondary | ICD-10-CM

## 2013-02-21 MED ORDER — SYNTHROID 75 MCG PO TABS: 75.0000 ug | ORAL_TABLET | Freq: Every day | ORAL | Status: DC

## 2013-02-21 NOTE — Progress Notes (Signed)
Quick Note:  TSH level is normal at 1.907 with my goal range being 1.0 - 2.0.  Will renew her Rx for Synthroid 75 mcg daily.  Return for physical exam and review of ultrasound in 2 years.  Earnstine Regal, MD, [facility] Office: [phone]   ______

## 2013-02-21 NOTE — Progress Notes (Signed)
Quick Note:  Slight continued enlargement of dominant left sided nodule. No additional worrisome findings.  Will repeat ultrasound in 2 years.  Earnstine Regal, MD, [facility] Office: [phone]   ______

## 2013-02-22 ENCOUNTER — Telehealth (INDEPENDENT_AMBULATORY_CARE_PROVIDER_SITE_OTHER): Payer: Self-pay

## 2013-02-22 NOTE — Telephone Encounter (Signed)
LMOM for pt to call back. Per Dr Harlow Asa pt to be advised he has refilled her RX for synthroid 75 mcg to be continued. TSH level is 1.9 .

## 2013-02-22 NOTE — Telephone Encounter (Signed)
LMOM to call back. Per Dr Harlow Asa slight continued enlargement of dominant left thyroid nodule. No additional worrisome findings. Will repeat ultrasound in 2 years.

## 2013-02-22 NOTE — Telephone Encounter (Signed)
Pt called back and message was delivered.

## 2013-03-22 ENCOUNTER — Telehealth: Payer: Self-pay | Admitting: *Deleted

## 2013-03-22 ENCOUNTER — Other Ambulatory Visit: Payer: Managed Care, Other (non HMO)

## 2013-03-22 DIAGNOSIS — N390 Urinary tract infection, site not specified: Secondary | ICD-10-CM

## 2013-03-22 NOTE — Telephone Encounter (Signed)
Pt called c/o possible UTI burning with urination, pt asked if u/a could be left in office? Unable to make appointment.  Please advise

## 2013-03-22 NOTE — Telephone Encounter (Signed)
Order placed, pt will be in today

## 2013-03-22 NOTE — Telephone Encounter (Signed)
Okay to leave urinalysis

## 2013-03-23 LAB — URINALYSIS W MICROSCOPIC + REFLEX CULTURE
BACTERIA UA: NONE SEEN
Bilirubin Urine: NEGATIVE
CASTS: NONE SEEN
Glucose, UA: NEGATIVE mg/dL
HGB URINE DIPSTICK: NEGATIVE
KETONES UR: NEGATIVE mg/dL
Leukocytes, UA: NEGATIVE
Nitrite: NEGATIVE
PH: 5.5 (ref 5.0–8.0)
Protein, ur: NEGATIVE mg/dL
SPECIFIC GRAVITY, URINE: 1.025 (ref 1.005–1.030)
Squamous Epithelial / LPF: NONE SEEN
Urobilinogen, UA: 0.2 mg/dL (ref 0.0–1.0)

## 2013-03-26 ENCOUNTER — Telehealth: Payer: Self-pay | Admitting: *Deleted

## 2013-03-26 NOTE — Telephone Encounter (Signed)
Pt called requesting u/a results on 03/22/13,

## 2013-05-03 ENCOUNTER — Telehealth: Payer: Self-pay | Admitting: *Deleted

## 2013-05-03 NOTE — Telephone Encounter (Signed)
Yes appeal.  Reason is continuation of services currently being undertaken.

## 2013-05-03 NOTE — Telephone Encounter (Signed)
Pt has new insurance and it has denied her coverage for Prolia. She was started on Prolia in 2012. Does an appeal need to done? Let me know and I can start that process. [facility]

## 2013-05-10 NOTE — Telephone Encounter (Signed)
Appeal sent today to her insurance K[facility] CMA

## 2013-05-18 NOTE — Telephone Encounter (Signed)
Checked on Appeal to denied PA and still pending, Lm for pt to let her know. [facility]W CMA

## 2013-06-16 NOTE — Telephone Encounter (Signed)
LM for pt to call back approval for [employer] appeal. Confirmation # [id number] good for 1 year 04/30/14 - 04/30/14 KW CMA

## 2013-06-18 ENCOUNTER — Other Ambulatory Visit: Payer: Self-pay | Admitting: Internal Medicine

## 2013-06-18 ENCOUNTER — Ambulatory Visit
Admission: RE | Admit: 2013-06-18 | Discharge: 2013-06-18 | Disposition: A | Discharge status: Home / Self Care | Payer: Managed Care, Other (non HMO) | Source: Ambulatory Visit | Attending: Internal Medicine | Admitting: Internal Medicine

## 2013-06-18 DIAGNOSIS — R071 Chest pain on breathing: Secondary | ICD-10-CM

## 2013-06-24 NOTE — Telephone Encounter (Signed)
Pt informed and coming on 06/28/13 [facility] CMA

## 2013-06-28 ENCOUNTER — Ambulatory Visit (INDEPENDENT_AMBULATORY_CARE_PROVIDER_SITE_OTHER): Payer: Managed Care, Other (non HMO) | Admitting: Gynecology

## 2013-06-28 DIAGNOSIS — M810 Age-related osteoporosis without current pathological fracture: Secondary | ICD-10-CM

## 2013-06-28 MED ORDER — DENOSUMAB 60 MG/ML ~~LOC~~ SOLN
60.0000 mg | Freq: Once | SUBCUTANEOUS | Status: AC
  Administered 2013-06-28: 60 mg via SUBCUTANEOUS

## 2013-07-01 ENCOUNTER — Encounter (HOSPITAL_COMMUNITY)
Admission: RE | Disposition: A | Discharge status: Home / Self Care | Payer: Self-pay | Source: Ambulatory Visit | Attending: Gastroenterology

## 2013-07-01 ENCOUNTER — Other Ambulatory Visit: Payer: Self-pay | Admitting: Gastroenterology

## 2013-07-01 ENCOUNTER — Encounter (HOSPITAL_COMMUNITY): Payer: Self-pay | Admitting: *Deleted

## 2013-07-01 ENCOUNTER — Ambulatory Visit (HOSPITAL_COMMUNITY)
Admission: RE | Admit: 2013-07-01 | Discharge: 2013-07-01 | Disposition: A | Discharge status: Home / Self Care | Payer: Managed Care, Other (non HMO) | Source: Ambulatory Visit | Attending: Gastroenterology | Admitting: Gastroenterology

## 2013-07-01 DIAGNOSIS — K5010 Crohn's disease of large intestine without complications: Secondary | ICD-10-CM | POA: Insufficient documentation

## 2013-07-01 DIAGNOSIS — Z885 Allergy status to narcotic agent status: Secondary | ICD-10-CM | POA: Insufficient documentation

## 2013-07-01 DIAGNOSIS — E039 Hypothyroidism, unspecified: Secondary | ICD-10-CM | POA: Insufficient documentation

## 2013-07-01 DIAGNOSIS — K56609 Unspecified intestinal obstruction, unspecified as to partial versus complete obstruction: Secondary | ICD-10-CM | POA: Insufficient documentation

## 2013-07-01 DIAGNOSIS — J45909 Unspecified asthma, uncomplicated: Secondary | ICD-10-CM | POA: Insufficient documentation

## 2013-07-01 HISTORY — PX: COLONOSCOPY: SHX5424

## 2013-07-01 SURGERY — COLONOSCOPY
Anesthesia: Moderate Sedation

## 2013-07-01 MED ORDER — ONDANSETRON HCL 4 MG/2ML IJ SOLN
INTRAMUSCULAR | Status: AC
  Filled 2013-07-01: qty 2

## 2013-07-01 MED ORDER — FENTANYL CITRATE 0.05 MG/ML IJ SOLN
INTRAMUSCULAR | Status: AC
  Filled 2013-07-01: qty 2

## 2013-07-01 MED ORDER — MIDAZOLAM HCL 10 MG/2ML IJ SOLN
INTRAMUSCULAR | Status: AC
  Filled 2013-07-01: qty 2

## 2013-07-01 MED ORDER — SODIUM CHLORIDE 0.9 % IV SOLN
INTRAVENOUS | Status: DC
Start: 2013-07-01 — End: 2013-07-01
  Administered 2013-07-01: 500 mL via INTRAVENOUS

## 2013-07-01 MED ORDER — ONDANSETRON HCL 4 MG/2ML IJ SOLN
INTRAMUSCULAR | Status: DC | PRN
  Administered 2013-07-01: 4 mg via INTRAVENOUS

## 2013-07-01 MED ORDER — FENTANYL CITRATE 0.05 MG/ML IJ SOLN
INTRAMUSCULAR | Status: DC | PRN
  Administered 2013-07-01: 50 ug via INTRAVENOUS
  Administered 2013-07-01: 25 ug via INTRAVENOUS

## 2013-07-01 MED ORDER — MIDAZOLAM HCL 5 MG/5ML IJ SOLN
INTRAMUSCULAR | Status: DC | PRN
Start: 2013-07-01 — End: 2013-07-01
  Administered 2013-07-01 (×4): 2.5 mg via INTRAVENOUS

## 2013-07-01 NOTE — Discharge Instructions (Signed)
Colonoscopy °Care After °These instructions give you information on caring for yourself after your procedure. Your doctor may also give you more specific instructions. Call your doctor if you have any problems or questions after your procedure. °HOME CARE °· Take it easy for the next 24 hours. °· Rest. °· Walk or use warm packs on your belly (abdomen) if you have belly cramping or gas. °· Do not drive for 24 hours. °· You may shower. °· Do not sign important papers or use machinery for 24 hours. °· Drink enough fluids to keep your pee (urine) clear or pale yellow. °· Resume your normal diet. Avoid heavy or fried foods. °· Avoid alcohol. °· Continue taking your normal medicines. °· Only take medicine as told by your doctor. Do not take aspirin. °If you had growths (polyps) removed: °· Do not take aspirin. °· Do not drink alcohol for 7 days or as told by your doctor. °· Eat a soft diet for 24 hours. °GET HELP RIGHT AWAY IF: °· You have a fever. °· You pass clumps of tissue (blood clots) or fill the toilet with blood. °· You have belly pain that gets worse and medicine does not help. °· Your belly is puffy (swollen). °· You feel sick to your stomach (nauseous) or throw up (vomit). °MAKE SURE YOU: °· Understand these instructions. °· Will watch your condition. °· Will get help right away if you are not doing well or get worse. °Document Released: 03/16/2010 Document Revised: 05/06/2011 Document Reviewed: 10/19/2012 °ExitCare® Patient Information ©2014 [employer]. ° °Conscious Sedation, Adult, Care After °Refer to this sheet in the next few weeks. These instructions provide you with information on caring for yourself after your procedure. Your health care provider may also give you more specific instructions. Your treatment has been planned according to current medical practices, but problems sometimes occur. Call your health care provider if you have any problems or questions after your procedure. °WHAT TO EXPECT  AFTER THE PROCEDURE  °After your procedure: °· You may feel sleepy, clumsy, and have poor balance for several hours. °· Vomiting may occur if you eat too soon after the procedure. °HOME CARE INSTRUCTIONS °· Do not participate in any activities where you could become injured for at least 24 hours. Do not: °· Drive. °· Swim. °· Ride a bicycle. °· Operate heavy machinery. °· Cook. °· Use power tools. °· Climb ladders. °· Work from a high place. °· Do not make important decisions or sign legal documents until you are improved. °· If you vomit, drink water, juice, or soup when you can drink without vomiting. Make sure you have little or no nausea before eating solid foods. °· Only take over-the-counter or prescription medicines for pain, discomfort, or fever as directed by your health care provider. °· Make sure you and your family fully understand everything about the medicines given to you, including what side effects may occur. °· You should not drink alcohol, take sleeping pills, or take medicines that cause drowsiness for at least 24 hours. °· If you smoke, do not smoke without supervision. °· If you are feeling better, you may resume normal activities 24 hours after you were sedated. °· Keep all appointments with your health care provider. °SEEK MEDICAL CARE IF: °· Your skin is pale or bluish in color. °· You continue to feel nauseous or vomit. °· Your pain is getting worse and is not helped by medicine. °· You have bleeding or swelling. °· You are still sleepy or   feeling clumsy after 24 hours. °SEEK IMMEDIATE MEDICAL CARE IF: °· You develop a rash. °· You have difficulty breathing. °· You develop any type of allergic problem. °· You have a fever. °MAKE SURE YOU: °· Understand these instructions. °· Will watch your condition. °· Will get help right away if you are not doing well or get worse. °Document Released: 12/02/2012 Document Reviewed: 09/18/2012 °ExitCare® Patient Information ©2014 [employer]. ° ° °

## 2013-07-01 NOTE — H&P (Signed)
  Problems: Crohn's colitis, low-grade fever, microscopic hematuria with negative urine culture  History: The patient is a 58 year old female born 1955/12/03. She was diagnosed with Crohn's colitis in 1981 by full column barium enema. 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  The patient has developed left lower quadrant abdominal discomfort with low-grade fevers and intermittent diarrhea.  She underwent a diagnostic flexible proctosigmoidoscopy this morning which showed a Crohn's stricture in the sigmoid colon which I could not traverse with the pediatric colonoscope. The patient was transferred to [facility] endoscopy center to undergo a colonoscopy using the ultra thin colonoscope.  Past medical history: Crohn's ileocolitis since 1981 osteopenia. Thyroid nodule. Asthma. Hematuria with negative urological evaluation. Rectal fissurectomy. Basal cell skin cancers. Squamous cell skin cancers. Hypothyroidism.  Medication allergies: Codeine  Exam: The patient is alert and lying comfortably on the endoscopy stretcher. Lungs are clear to auscultation. Cardiac exam reveals a regular rhythm. Abdomen is soft and somewhat tender in the left lower quadrant exam.  Plan: Proceed with diagnostic colonoscopy using the ultra thin colonoscope

## 2013-07-01 NOTE — Op Note (Signed)
Problems: Crohn's colitis since 1981, low-grade fever  Endoscopist: Earle Gell  Premedication: Zofran 4 mg. Versed 10 mg. Fentanyl 75 mcg  Procedure: Diagnostic colonoscopy The patient was placed in the left lateral decubitus position. Anal inspection and digital rectal exam were normal. The Pentax pediatric colonoscope was introduced into the rectum and advanced to the cecum. A normal-appearing appendiceal orifice was identified. Colonic preparation for the exam today was good.  The patient has active Crohn's proctocolitis. Several deep ulcers with exudative bases were present in the rectum. Multiple areas of active Crohn's colitis were identified associated with deep mucosal ulcers and stricture formation in the sigmoid colon, transverse colon, and ascending colon. Areas of normal-appearing mucosa were identified as well as scarred colonic mucosa unassociated with active colitis. In the proximal ascending colon, which might be the ileocecal, was severely ulcerated and scarred. I was unable to traverse what I thought was the ileocecal valve and entered the terminal ileum.  Multiple biopsies were performed from the active Crohn's colitis associated with stricture formation.  Assessment: Patchy active Crohn's proctocolitis complicated by deep ulcers and stricture formation. Biopsies pending.

## 2013-07-02 ENCOUNTER — Encounter (HOSPITAL_COMMUNITY): Payer: Self-pay | Admitting: Gastroenterology

## 2013-09-28 ENCOUNTER — Other Ambulatory Visit (INDEPENDENT_AMBULATORY_CARE_PROVIDER_SITE_OTHER): Payer: Self-pay

## 2013-09-28 ENCOUNTER — Telehealth (INDEPENDENT_AMBULATORY_CARE_PROVIDER_SITE_OTHER): Payer: Self-pay

## 2013-09-28 DIAGNOSIS — E041 Nontoxic single thyroid nodule: Secondary | ICD-10-CM

## 2013-09-28 MED ORDER — SYNTHROID 75 MCG PO TABS: 75.0000 ug | ORAL_TABLET | Freq: Every day | ORAL | Status: AC

## 2013-09-28 NOTE — Telephone Encounter (Signed)
Refill synthroid 6mg # 90 with 3 refills [facility] Dr [facility]

## 2013-11-29 ENCOUNTER — Other Ambulatory Visit: Payer: Self-pay | Admitting: Gynecology

## 2013-11-29 DIAGNOSIS — Z1231 Encounter for screening mammogram for malignant neoplasm of breast: Secondary | ICD-10-CM

## 2013-12-16 ENCOUNTER — Encounter: Payer: Self-pay | Admitting: Gynecology

## 2013-12-16 ENCOUNTER — Other Ambulatory Visit (HOSPITAL_COMMUNITY)
Admission: RE | Admit: 2013-12-16 | Discharge: 2013-12-16 | Disposition: A | Discharge status: Home / Self Care | Payer: Managed Care, Other (non HMO) | Source: Ambulatory Visit | Attending: Gynecology | Admitting: Gynecology

## 2013-12-16 ENCOUNTER — Ambulatory Visit (INDEPENDENT_AMBULATORY_CARE_PROVIDER_SITE_OTHER): Payer: Managed Care, Other (non HMO) | Admitting: Gynecology

## 2013-12-16 VITALS — BP 120/76 | Ht 66.0 in | Wt 160.0 lb

## 2013-12-16 DIAGNOSIS — Z01419 Encounter for gynecological examination (general) (routine) without abnormal findings: Secondary | ICD-10-CM | POA: Insufficient documentation

## 2013-12-16 DIAGNOSIS — T8389XD Other specified complication of genitourinary prosthetic devices, implants and grafts, subsequent encounter: Secondary | ICD-10-CM

## 2013-12-16 DIAGNOSIS — Z1151 Encounter for screening for human papillomavirus (HPV): Secondary | ICD-10-CM | POA: Diagnosis present

## 2013-12-16 DIAGNOSIS — M810 Age-related osteoporosis without current pathological fracture: Secondary | ICD-10-CM

## 2013-12-16 DIAGNOSIS — N952 Postmenopausal atrophic vaginitis: Secondary | ICD-10-CM

## 2013-12-16 LAB — URINALYSIS W MICROSCOPIC + REFLEX CULTURE
Bacteria, UA: NONE SEEN
Bilirubin Urine: NEGATIVE
CASTS: NONE SEEN
Crystals: NONE SEEN
GLUCOSE, UA: NEGATIVE mg/dL
Ketones, ur: NEGATIVE mg/dL
Nitrite: NEGATIVE
Protein, ur: NEGATIVE mg/dL
SQUAMOUS EPITHELIAL / LPF: NONE SEEN
Specific Gravity, Urine: 1.007 (ref 1.005–1.030)
Urobilinogen, UA: 0.2 mg/dL (ref 0.0–1.0)
pH: 5.5 (ref 5.0–8.0)

## 2013-12-16 NOTE — Addendum Note (Signed)
Addended by: Nelva Nay on: 12/16/2013 09:18 AM   Modules accepted: Orders

## 2013-12-16 NOTE — Patient Instructions (Signed)
You may obtain a copy of any labs that were done today by logging onto MyChart as outlined in the instructions provided with your AVS (after visit summary). The office will not call with normal lab results but certainly if there are any significant abnormalities then we will contact you.   Health Maintenance, Female A healthy lifestyle and preventative care can promote health and wellness.  Maintain regular health, dental, and eye exams.  Eat a healthy diet. Foods like vegetables, fruits, whole grains, low-fat dairy products, and lean protein foods contain the nutrients you need without too many calories. Decrease your intake of foods high in solid fats, added sugars, and salt. Get information about a proper diet from your caregiver, if necessary.  Regular physical exercise is one of the most important things you can do for your health. Most adults should get at least 150 minutes of moderate-intensity exercise (any activity that increases your heart rate and causes you to sweat) each week. In addition, most adults need muscle-strengthening exercises on 2 or more days a week.   Maintain a healthy weight. The body mass index (BMI) is a screening tool to identify possible weight problems. It provides an estimate of body fat based on height and weight. Your caregiver can help determine your BMI, and can help you achieve or maintain a healthy weight. For adults 20 years and older:  A BMI below 18.5 is considered underweight.  A BMI of 18.5 to 24.9 is normal.  A BMI of 25 to 29.9 is considered overweight.  A BMI of 30 and above is considered obese.  Maintain normal blood lipids and cholesterol by exercising and minimizing your intake of saturated fat. Eat a balanced diet with plenty of fruits and vegetables. Blood tests for lipids and cholesterol should begin at age 61 and be repeated every 5 years. If your lipid or cholesterol levels are high, you are over 50, or you are a high risk for heart  disease, you may need your cholesterol levels checked more frequently.Ongoing high lipid and cholesterol levels should be treated with medicines if diet and exercise are not effective.  If you smoke, find out from your caregiver how to quit. If you do not use tobacco, do not start.  Lung cancer screening is recommended for adults aged 33 80 years who are at high risk for developing lung cancer because of a history of smoking. Yearly low-dose computed tomography (CT) is recommended for people who have at least a 30-pack-year history of smoking and are a current smoker or have quit within the past 15 years. A pack year of smoking is smoking an average of 1 pack of cigarettes a day for 1 year (for example: 1 pack a day for 30 years or 2 packs a day for 15 years). Yearly screening should continue until the smoker has stopped smoking for at least 15 years. Yearly screening should also be stopped for people who develop a health problem that would prevent them from having lung cancer treatment.  If you are pregnant, do not drink alcohol. If you are breastfeeding, be very cautious about drinking alcohol. If you are not pregnant and choose to drink alcohol, do not exceed 1 drink per day. One drink is considered to be 12 ounces (355 mL) of beer, 5 ounces (148 mL) of wine, or 1.5 ounces (44 mL) of liquor.  Avoid use of street drugs. Do not share needles with anyone. Ask for help if you need support or instructions about stopping  the use of drugs.  High blood pressure causes heart disease and increases the risk of stroke. Blood pressure should be checked at least every 1 to 2 years. Ongoing high blood pressure should be treated with medicines, if weight loss and exercise are not effective.  If you are 59 to 58 years old, ask your caregiver if you should take aspirin to prevent strokes.  Diabetes screening involves taking a blood sample to check your fasting blood sugar level. This should be done once every 3  years, after age 91, if you are within normal weight and without risk factors for diabetes. Testing should be considered at a younger age or be carried out more frequently if you are overweight and have at least 1 risk factor for diabetes.  Breast cancer screening is essential preventative care for women. You should practice "breast self-awareness." This means understanding the normal appearance and feel of your breasts and may include breast self-examination. Any changes detected, no matter how small, should be reported to a caregiver. Women in their 66s and 30s should have a clinical breast exam (CBE) by a caregiver as part of a regular health exam every 1 to 3 years. After age 101, women should have a CBE every year. Starting at age 100, women should consider having a mammogram (breast X-ray) every year. Women who have a family history of breast cancer should talk to their caregiver about genetic screening. Women at a high risk of breast cancer should talk to their caregiver about having an MRI and a mammogram every year.  Breast cancer gene (BRCA)-related cancer risk assessment is recommended for women who have family members with BRCA-related cancers. BRCA-related cancers include breast, ovarian, tubal, and peritoneal cancers. Having family members with these cancers may be associated with an increased risk for harmful changes (mutations) in the breast cancer genes BRCA1 and BRCA2. Results of the assessment will determine the need for genetic counseling and BRCA1 and BRCA2 testing.  The Pap test is a screening test for cervical cancer. Women should have a Pap test starting at age 57. Between ages 25 and 35, Pap tests should be repeated every 2 years. Beginning at age 37, you should have a Pap test every 3 years as long as the past 3 Pap tests have been normal. If you had a hysterectomy for a problem that was not cancer or a condition that could lead to cancer, then you no longer need Pap tests. If you are  between ages 50 and 76, and you have had normal Pap tests going back 10 years, you no longer need Pap tests. If you have had past treatment for cervical cancer or a condition that could lead to cancer, you need Pap tests and screening for cancer for at least 20 years after your treatment. If Pap tests have been discontinued, risk factors (such as a new sexual partner) need to be reassessed to determine if screening should be resumed. Some women have medical problems that increase the chance of getting cervical cancer. In these cases, your caregiver may recommend more frequent screening and Pap tests.  The human papillomavirus (HPV) test is an additional test that may be used for cervical cancer screening. The HPV test looks for the virus that can cause the cell changes on the cervix. The cells collected during the Pap test can be tested for HPV. The HPV test could be used to screen women aged 44 years and older, and should be used in women of any age  who have unclear Pap test results. After the age of 55, women should have HPV testing at the same frequency as a Pap test.  Colorectal cancer can be detected and often prevented. Most routine colorectal cancer screening begins at the age of 44 and continues through age 20. However, your caregiver may recommend screening at an earlier age if you have risk factors for colon cancer. On a yearly basis, your caregiver may provide home test kits to check for hidden blood in the stool. Use of a small camera at the end of a tube, to directly examine the colon (sigmoidoscopy or colonoscopy), can detect the earliest forms of colorectal cancer. Talk to your caregiver about this at age 86, when routine screening begins. Direct examination of the colon should be repeated every 5 to 10 years through age 13, unless early forms of pre-cancerous polyps or small growths are found.  Hepatitis C blood testing is recommended for all people born from [birth year] through 1965 and any  individual with known risks for hepatitis C.  Practice safe sex. Use condoms and avoid high-risk sexual practices to reduce the spread of sexually transmitted infections (STIs). Sexually active women aged 36 and younger should be checked for Chlamydia, which is a common sexually transmitted infection. Older women with new or multiple partners should also be tested for Chlamydia. Testing for other STIs is recommended if you are sexually active and at increased risk.  Osteoporosis is a disease in which the bones lose minerals and strength with aging. This can result in serious bone fractures. The risk of osteoporosis can be identified using a bone density scan. Women ages 20 and over and women at risk for fractures or osteoporosis should discuss screening with their caregivers. Ask your caregiver whether you should be taking a calcium supplement or vitamin D to reduce the rate of osteoporosis.  Menopause can be associated with physical symptoms and risks. Hormone replacement therapy is available to decrease symptoms and risks. You should talk to your caregiver about whether hormone replacement therapy is right for you.  Use sunscreen. Apply sunscreen liberally and repeatedly throughout the day. You should seek shade when your shadow is shorter than you. Protect yourself by wearing long sleeves, pants, a wide-brimmed hat, and sunglasses year round, whenever you are outdoors.  Notify your caregiver of new moles or changes in moles, especially if there is a change in shape or color. Also notify your caregiver if a mole is larger than the size of a pencil eraser.  Stay current with your immunizations. Document Released: 08/27/2010 Document Revised: 06/08/2012 Document Reviewed: 08/27/2010 [facility] Patient Information 2014 [place].

## 2013-12-16 NOTE — Progress Notes (Signed)
Robin [name] [birth year] [id number]        58 y.o.  G0P0 for annual exam.  Several issues noted below.  Past medical history,surgical history, problem list, medications, allergies, family history and social history were all reviewed and documented as reviewed in the EPIC chart.  ROS:  12 system ROS performed with pertinent positives and negatives included in the history, assessment and plan.   Additional significant findings :  none   Exam: [name] Counsellor Vitals:   12/16/13 0819  BP: 120/76  Height: 5' 6"  (1.676 m)  Weight: 160 lb (72.576 kg)   General appearance:  Normal affect, orientation and appearance. Skin: Grossly normal HEENT: Without gross lesions.  No cervical or supraclavicular adenopathy. Thyroid normal.  Lungs:  Clear without wheezing, rales or rhonchi Cardiac: RR, without RMG Abdominal:  Soft, nontender, without masses, guarding, rebound, organomegaly or hernia Breasts:  Examined lying and sitting without masses, retractions, discharge or axillary adenopathy. Pelvic:  Ext/BUS/vagina with generalized atrophic changes  Cervix with atrophic changes. Pap/HPV  Uterus anteverted, normal size, shape and contour, midline and mobile nontender   Adnexa  Without masses or tenderness    Anus and perineum  Normal   Rectovaginal  Normal sphincter tone without palpated masses or tenderness.    Assessment/Plan:  58 y.o. G0P0 female for annual exam.   1. Postmenopausal/atrophic genital changes. Patient overall doing well without significant symptoms of hot flushes, night sweats, vaginal dryness or dyspareunia. No vaginal bleeding. Continue to monitor. Report any vaginal bleeding. 2. IUD in place. Patient apparently has safety coil placed by Dr. Dorathy Kinsman 1983. Dr. Cherylann Banas attempted to retrieve under a paracervical block with cervical dilatation. He stated in his note that he could feel the IUD but unable to retrieve it. The plan was to leave it in place as long as she is not  having any issues with this. I again reviewed the situation in the issues of a more aggressive evaluation with studies for location and attempted retrieval. If she is having no issues we'll hold on any further studies at I do think that we would act upon them and we'll continue to monitor at this time. 3. Osteoporosis. DEXA 11/2012 T score -2.4 improved from prior DEXA T score -2.6 on Prolia initiated 2013 into her third year. Will continue and repeat DEXA next year a 2 year interval. She continues on low-dose prednisone 10 mg daily for her Crohn's. Increased calcium vitamin D reviewed. 4. Pap smear 2012. Pap/HPV done today. No history of abnormal Pap smears previously. Plan repeat Pap smear at 3-5 year interval per current screening guidelines assuming this Pap smear is normal. 5. Mammography due now and I reminded her to do this. SBE monthly reviewed. 6. Colonoscopy 2015. Continue to follow up with her gastroenterologist in reference to her Crohn's disease. 7. Health maintenance. No routine blood work done. She has this done through her primary physician's office. Continue with her Prolia otherwise follow up in one year for annual exam.     Anastasio Auerbach MD, 8:58 AM 12/16/2013

## 2013-12-17 LAB — CYTOLOGY - PAP

## 2013-12-19 LAB — URINE CULTURE: Colony Count: >=100000

## 2013-12-20 ENCOUNTER — Telehealth: Payer: Self-pay | Admitting: *Deleted

## 2013-12-20 MED ORDER — SULFAMETHOXAZOLE-TMP DS 800-160 MG PO TABS: 1.0000 | ORAL_TABLET | Freq: Two times a day (BID) | ORAL | Status: DC

## 2013-12-20 NOTE — Telephone Encounter (Signed)
Should be okay

## 2013-12-20 NOTE — Telephone Encounter (Signed)
Pt informed with the below note, pt said she is taking prednisone if okay take septra with this?

## 2013-12-20 NOTE — Telephone Encounter (Signed)
Left on voicemail rx sent,

## 2013-12-20 NOTE — Telephone Encounter (Signed)
Message copied by Thamas Jaegers on Mon Dec 20, 2013  3:17 PM ------      Message from: Anastasio Auerbach      Created: Mon Dec 20, 2013  7:59 AM       Tell patient it looks like she has a urinary tract infection. Recommend Septra DS 1 by mouth twice a day 3 days ------

## 2013-12-28 ENCOUNTER — Telehealth: Payer: Self-pay | Admitting: *Deleted

## 2013-12-28 ENCOUNTER — Other Ambulatory Visit: Payer: Self-pay | Admitting: Gynecology

## 2013-12-28 ENCOUNTER — Ambulatory Visit (HOSPITAL_COMMUNITY)
Admission: RE | Admit: 2013-12-28 | Discharge: 2013-12-28 | Disposition: A | Discharge status: Home / Self Care | Payer: Managed Care, Other (non HMO) | Source: Ambulatory Visit | Attending: Gynecology | Admitting: Gynecology

## 2013-12-28 DIAGNOSIS — Z1231 Encounter for screening mammogram for malignant neoplasm of breast: Secondary | ICD-10-CM | POA: Diagnosis present

## 2013-12-28 MED ORDER — NITROFURANTOIN MONOHYD MACRO 100 MG PO CAPS: 100.0000 mg | ORAL_CAPSULE | Freq: Two times a day (BID) | ORAL | Status: DC

## 2013-12-28 NOTE — Telephone Encounter (Signed)
LM a detailed a message for pt. Advised due for Prolia after 12/29/13. Insurance covers at $50 copay. Prior Authorization obtained in March 2015 - good til March 2016. Advised to call back to schedule injection time. KW CMA

## 2013-12-31 ENCOUNTER — Ambulatory Visit (INDEPENDENT_AMBULATORY_CARE_PROVIDER_SITE_OTHER): Payer: Managed Care, Other (non HMO) | Admitting: *Deleted

## 2013-12-31 DIAGNOSIS — M810 Age-related osteoporosis without current pathological fracture: Secondary | ICD-10-CM

## 2013-12-31 MED ORDER — DENOSUMAB 60 MG/ML ~~LOC~~ SOLN
60.0000 mg | Freq: Once | SUBCUTANEOUS | Status: AC
  Administered 2013-12-31: 60 mg via SUBCUTANEOUS

## 2014-01-10 NOTE — Telephone Encounter (Signed)
Pt came 12/31/13 for Prolia injection KW CMA

## 2014-03-15 ENCOUNTER — Other Ambulatory Visit: Payer: Self-pay | Admitting: Cardiology

## 2014-03-15 ENCOUNTER — Emergency Department (HOSPITAL_COMMUNITY)
Admission: EM | Admit: 2014-03-15 | Discharge: 2014-03-15 | Disposition: A | Discharge status: Home / Self Care | Payer: Commercial Indemnity | Attending: Emergency Medicine | Admitting: Emergency Medicine

## 2014-03-15 ENCOUNTER — Encounter (HOSPITAL_COMMUNITY): Payer: Self-pay | Admitting: Physical Medicine and Rehabilitation

## 2014-03-15 ENCOUNTER — Other Ambulatory Visit: Payer: Self-pay

## 2014-03-15 ENCOUNTER — Emergency Department (HOSPITAL_COMMUNITY): Payer: Commercial Indemnity

## 2014-03-15 DIAGNOSIS — Z7952 Long term (current) use of systemic steroids: Secondary | ICD-10-CM | POA: Insufficient documentation

## 2014-03-15 DIAGNOSIS — E039 Hypothyroidism, unspecified: Secondary | ICD-10-CM | POA: Diagnosis not present

## 2014-03-15 DIAGNOSIS — Z85828 Personal history of other malignant neoplasm of skin: Secondary | ICD-10-CM | POA: Insufficient documentation

## 2014-03-15 DIAGNOSIS — Z975 Presence of (intrauterine) contraceptive device: Secondary | ICD-10-CM | POA: Insufficient documentation

## 2014-03-15 DIAGNOSIS — I498 Other specified cardiac arrhythmias: Secondary | ICD-10-CM | POA: Diagnosis not present

## 2014-03-15 DIAGNOSIS — I480 Paroxysmal atrial fibrillation: Secondary | ICD-10-CM

## 2014-03-15 DIAGNOSIS — Z79899 Other long term (current) drug therapy: Secondary | ICD-10-CM | POA: Diagnosis not present

## 2014-03-15 DIAGNOSIS — I4891 Unspecified atrial fibrillation: Secondary | ICD-10-CM

## 2014-03-15 DIAGNOSIS — J45909 Unspecified asthma, uncomplicated: Secondary | ICD-10-CM | POA: Diagnosis not present

## 2014-03-15 DIAGNOSIS — R002 Palpitations: Secondary | ICD-10-CM

## 2014-03-15 DIAGNOSIS — M810 Age-related osteoporosis without current pathological fracture: Secondary | ICD-10-CM | POA: Insufficient documentation

## 2014-03-15 DIAGNOSIS — R079 Chest pain, unspecified: Secondary | ICD-10-CM | POA: Insufficient documentation

## 2014-03-15 DIAGNOSIS — Z8719 Personal history of other diseases of the digestive system: Secondary | ICD-10-CM | POA: Diagnosis not present

## 2014-03-15 DIAGNOSIS — R072 Precordial pain: Secondary | ICD-10-CM

## 2014-03-15 LAB — CBC WITH DIFFERENTIAL/PLATELET
BASOS PCT: 1 % (ref 0–1)
Basophils Absolute: 0.1 10*3/uL (ref 0.0–0.1)
EOS ABS: 0.1 10*3/uL (ref 0.0–0.7)
Eosinophils Relative: 1 % (ref 0–5)
HCT: 37.8 % (ref 36.0–46.0)
Hemoglobin: 12.5 g/dL (ref 12.0–15.0)
LYMPHS ABS: 1.8 10*3/uL (ref 0.7–4.0)
Lymphocytes Relative: 17 % (ref 12–46)
MCH: 28.8 pg (ref 26.0–34.0)
MCHC: 33.1 g/dL (ref 30.0–36.0)
MCV: 87.1 fL (ref 78.0–100.0)
MONO ABS: 0.6 10*3/uL (ref 0.1–1.0)
MONOS PCT: 6 % (ref 3–12)
NEUTROS PCT: 75 % (ref 43–77)
Neutro Abs: 8.1 10*3/uL — ABNORMAL HIGH (ref 1.7–7.7)
Platelets: 431 10*3/uL — ABNORMAL HIGH (ref 150–400)
RBC: 4.34 MIL/uL (ref 3.87–5.11)
RDW: 13.4 % (ref 11.5–15.5)
WBC: 10.7 10*3/uL — AB (ref 4.0–10.5)

## 2014-03-15 LAB — COMPREHENSIVE METABOLIC PANEL
ALK PHOS: 48 U/L (ref 39–117)
ALT: 19 U/L (ref 0–35)
AST: 20 U/L (ref 0–37)
Albumin: 3.7 g/dL (ref 3.5–5.2)
Anion gap: 9 (ref 5–15)
BUN: 8 mg/dL (ref 6–23)
CALCIUM: 8.9 mg/dL (ref 8.4–10.5)
CO2: 26 mmol/L (ref 19–32)
CREATININE: 0.75 mg/dL (ref 0.50–1.10)
Chloride: 106 mEq/L (ref 96–112)
GFR calc Af Amer: >90 mL/min (ref >90)
GFR calc non Af Amer: >90 mL/min (ref >90)
Glucose, Bld: 99 mg/dL (ref 70–99)
Potassium: 3.9 mmol/L (ref 3.5–5.1)
Sodium: 141 mmol/L (ref 135–145)
Total Bilirubin: 0.5 mg/dL (ref 0.3–1.2)
Total Protein: 7.1 g/dL (ref 6.0–8.3)

## 2014-03-15 LAB — T4: T4, Total: 11 ug/dL (ref 4.5–12.0)

## 2014-03-15 LAB — I-STAT TROPONIN, ED: Troponin i, poc: 0 ng/mL (ref 0.00–0.08)

## 2014-03-15 LAB — TSH: TSH: 1.894 u[IU]/mL (ref 0.350–4.500)

## 2014-03-15 MED ORDER — METOPROLOL SUCCINATE ER 25 MG PO TB24: 50.0000 mg | ORAL_TABLET | Freq: Every day | ORAL | Status: DC

## 2014-03-15 NOTE — H&P (Signed)
Primary cardiologist: New  HPI: 59 year old female for evaluation of atrial fibrillation and chest pain. No prior cardiac history. She has mild dyspnea on exertion but no orthopnea, PND, pedal edema, syncope or exertional chest pain. Over the past 6 months she has noticed intermittent palpitations. They are not related to exertion. They are sudden in onset and typically last 5-15 minutes. They are described as heart racing. She feels associated chest pressure but has not had syncope. She has been checking her blood pressure and it was elevated today and she went to see her primary care physician. She was having palpitations and an electrocardiogram demonstrated sinus rhythm with lateral ST depression. Her symptoms have resolved and she is presently asymptomatic. Cardiology asked to evaluate.   (Not in a hospital admission)  Allergies  Allergen Reactions  . Biaxin [Clarithromycin]   . Clarithromycin   . Tetracyclines & Related Diarrhea    Past Medical History  Diagnosis Date  . Asthma   . Arthritis   . Cancer     skin  . Crohn's disease   . IUD (intrauterine device) in place 1983    Safety coil per Dr. Valeta Harms note attempted to retrieve but unable to do so  . Osteoporosis 11/2012    T score -2.4 improved from -2.6 on Prolia  . Thyroid disease     Hypothyroid    Past Surgical History  Procedure Laterality Date  . Basal and squamous cell ca excised    . Fissure surg    . Colonoscopy N/A 07/01/2013    Procedure: COLONOSCOPY;  Surgeon: Garlan Fair, MD;  Location: [facility];  Service: Endoscopy;  Laterality: N/A;    History   Social History  . Marital Status: Married    Spouse Name: N/A    Number of Children: N/A  . Years of Education: N/A   Occupational History  . Not on file.   Social History Main Topics  . Smoking status: Never Smoker   . Smokeless tobacco: Not on file  . Alcohol Use: 0.0 oz/week    0 Not specified per week     Comment: Occasional  .  Drug Use: No  . Sexual Activity: Yes    Birth Control/ Protection: Post-menopausal   Other Topics Concern  . Not on file   Social History Narrative    Family History  Problem Relation Age of Onset  . Colon cancer Mother   . Heart disease Maternal Grandmother   . Hypertension Maternal Grandmother   . Breast cancer Paternal Grandmother     age unknown  . Heart disease Mother     Atrial fibrillation    ROS:  no fevers or chills, productive cough, hemoptysis, dysphasia, odynophagia, melena, hematochezia, dysuria, hematuria, rash, seizure activity, orthopnea, PND, pedal edema, claudication. Remaining systems are negative.  Physical Exam:   Blood pressure 158/94, pulse 96, resp. rate 15, height _0  (1.727 m), weight 160 lb (72.576 kg), SpO2 98 %.  General:  Well developed/well nourished in NAD Skin warm/dry Patient not depressed No peripheral clubbing Back-normal HEENT-normal/normal eyelids Neck supple/normal carotid upstroke bilaterally; no bruits; no JVD; no thyromegaly chest - CTA/ normal expansion CV - RRR/normal S1 and S2; no murmurs, rubs or gallops;  PMI nondisplaced Abdomen -NT/ND, no HSM, no mass, + bowel sounds, no bruit 2+ femoral pulses, no bruits Ext-no edema, chords, 2+ DP Neuro-grossly nonfocal  ECG electrocardiogram at primary care's office showed sinus rhythm converting to atrial fibrillation. With the elevated heart rate  she had approximately 1 mm of ST depression laterally. Follow-up electrocardiogram in the emergency room showed sinus rhythm with no significant ST changes.  Results for orders placed or performed during the hospital encounter of 03/15/14 (from the past 48 hour(s))  CBC with Differential     Status: Abnormal   Collection Time: 03/15/14 11:31 AM  Result Value Ref Range   WBC 10.7 (H) 4.0 - 10.5 K/uL   RBC 4.34 3.87 - 5.11 MIL/uL   Hemoglobin 12.5 12.0 - 15.0 g/dL   HCT 37.8 36.0 - 46.0 %   MCV 87.1 78.0 - 100.0 fL   MCH 28.8 26.0 -  34.0 pg   MCHC 33.1 30.0 - 36.0 g/dL   RDW 13.4 11.5 - 15.5 %   Platelets 431 (H) 150 - 400 K/uL   Neutrophils Relative % 75 43 - 77 %   Neutro Abs 8.1 (H) 1.7 - 7.7 K/uL   Lymphocytes Relative 17 12 - 46 %   Lymphs Abs 1.8 0.7 - 4.0 K/uL   Monocytes Relative 6 3 - 12 %   Monocytes Absolute 0.6 0.1 - 1.0 K/uL   Eosinophils Relative 1 0 - 5 %   Eosinophils Absolute 0.1 0.0 - 0.7 K/uL   Basophils Relative 1 0 - 1 %   Basophils Absolute 0.1 0.0 - 0.1 K/uL  Comprehensive metabolic panel     Status: None   Collection Time: 03/15/14 11:31 AM  Result Value Ref Range   Sodium 141 135 - 145 mmol/L    Comment: Please note change in reference range.   Potassium 3.9 3.5 - 5.1 mmol/L    Comment: Please note change in reference range.   Chloride 106 96 - 112 mEq/L   CO2 26 19 - 32 mmol/L   Glucose, Bld 99 70 - 99 mg/dL   BUN 8 6 - 23 mg/dL   Creatinine, Ser 0.75 0.50 - 1.10 mg/dL   Calcium 8.9 8.4 - 10.5 mg/dL   Total Protein 7.1 6.0 - 8.3 g/dL   Albumin 3.7 3.5 - 5.2 g/dL   AST 20 0 - 37 U/L   ALT 19 0 - 35 U/L   Alkaline Phosphatase 48 39 - 117 U/L   Total Bilirubin 0.5 0.3 - 1.2 mg/dL   GFR calc non Af Amer >90 >90 mL/min   GFR calc Af Amer >90 >90 mL/min    Comment: (NOTE) The eGFR has been calculated using the CKD EPI equation. This calculation has not been validated in all clinical situations. eGFR's persistently <90 mL/min signify possible Chronic Kidney Disease.    Anion gap 9 5 - 15  TSH     Status: None   Collection Time: 03/15/14 11:31 AM  Result Value Ref Range   TSH 1.894 0.350 - 4.500 uIU/mL  I-Stat Troponin, ED (not at [facility])     Status: None   Collection Time: 03/15/14 11:43 AM  Result Value Ref Range   Troponin i, poc 0.00 0.00 - 0.08 ng/mL   Comment 3            Comment: Due to the release kinetics of cTnI, a negative result within the first hours of the onset of symptoms does not rule out myocardial infarction with certainty. If myocardial infarction is still  suspected, repeat the test at appropriate intervals.     Dg Chest 2 View  03/15/2014   CLINICAL DATA:  Cardiac palpitations, intermittent for six-month  EXAM: CHEST  2 VIEW  COMPARISON:  June 18, 2013  FINDINGS: There is subsegmental atelectasis in each lung base. Lungs elsewhere clear. Heart size and pulmonary vascularity are normal. No adenopathy. No bone lesions.  IMPRESSION: Subsegmental atelectasis in each lung base. No edema or consolidation.   Electronically Signed   By: Lowella Grip M.D.   On: 03/15/2014 11:43    Assessment/Plan 1 paroxysmal atrial fibrillation-the patient has been having intermittent palpitations and her electrocardiogram demonstrates atrial fibrillation. She is now back in sinus rhythm. She consumes significant amounts of coffee up to 1 pot per day. I have asked her to decrease her caffeine use. Her only embolic risk factor is female sex. Her CHADS vasc is 1. I will treat with aspirin 325 mg daily. TSH is normal. Arrange outpatient echocardiogram. Begin Toprol 50 mg daily. If she has more frequent episodes in the future she may require antiarrhythmic. 2 chest pressure-this only occurs when she has palpitations. She did have chest pressure with her palpitations today and her electrocardiogram showed 1 mm of lateral ST depression. I recommended admission with rule out myocardial infarction with serial enzymes. However she declined. She understands the risk. We will arrange an outpatient stress echocardiogram for risk stratification. 3 history of  Crohn's 4 hypothyroid-continue Synthroid.   Kirk Ruths MD 03/15/2014, 1:20 PM

## 2014-03-15 NOTE — ED Notes (Addendum)
Pt presents to department for evaluation of palpitations. Onset this morning @ 6:00am after waking up. History of atrial fibrillation. Also states SOB with ambulation. Pt is alert and oriented x4.

## 2014-03-15 NOTE — ED Notes (Signed)
Dr. Demaris Callander at bedside.

## 2014-03-15 NOTE — ED Provider Notes (Signed)
CSN: [id number]     Arrival date & time 03/15/14  1028 History   First MD Initiated Contact with Patient 03/15/14 1042     Chief Complaint  Patient presents with  . Palpitations     (Consider location/radiation/quality/duration/timing/severity/associated sxs/prior Treatment) HPI Comments: Patient here complaining of palpitations which have been intermittent for several weeks. Also was found to have high blood pressure at home yesterday with a systolic and 588F diastolic in the 02D. Went to her doctor today and was found to have intermittent atrial fibrillation. Denies any syncope or nursing to be with this but did compared having some associated weakness. No associated chest pain chest pressure. Patient does drink 10 cups of coffee in the morning. Patient is complaining of palpitations at this time but denies any irregular heartbeat. No prior history of same. No medications used for this problem prior to arrival. She also does have a history of having hypothyroidism  The history is provided by the patient.    Past Medical History  Diagnosis Date  . Asthma   . Arthritis   . Cancer     skin  . Crohn's disease   . IUD (intrauterine device) in place 1983    Safety coil per Dr. Valeta Harms note attempted to retrieve but unable to do so  . Osteoporosis 11/2012    T score -2.4 improved from -2.6 on Prolia  . Thyroid disease     Hypothyroid   Past Surgical History  Procedure Laterality Date  . Basal and squamous cell ca excised    . Fissure surg    . Colonoscopy N/A 07/01/2013    Procedure: COLONOSCOPY;  Surgeon: Garlan Fair, MD;  Location: [facility];  Service: Endoscopy;  Laterality: N/A;   Family History  Problem Relation Age of Onset  . Colon cancer Mother   . Heart disease Maternal Grandmother   . Hypertension Maternal Grandmother   . Breast cancer Paternal Grandmother     age unknown   History  Substance Use Topics  . Smoking status: Never Smoker   . Smokeless  tobacco: Not on file  . Alcohol Use: 0.0 oz/week     Comment: Occas   OB History    Gravida Para Term Preterm AB TAB SAB Ectopic Multiple Living   0              Review of Systems  All other systems reviewed and are negative.     Allergies  Biaxin; Clarithromycin; and Tetracyclines & related  Home Medications   Prior to Admission medications   Medication Sig Start Date End Date Taking? Authorizing Provider  Biotin 10 MG TABS Take by mouth.      Historical Provider, MD  denosumab (PROLIA) 60 MG/ML SOLN injection Inject 60 mg into the skin every 6 (six) months. Administer in upper arm, thigh, or abdomen    Historical Provider, MD  nitrofurantoin, macrocrystal-monohydrate, (MACROBID) 100 MG capsule Take 1 capsule (100 mg total) by mouth 2 (two) times daily. 12/28/13   Anastasio Auerbach, MD  predniSONE (DELTASONE) 10 MG tablet Take 10 mg by mouth daily with breakfast.    Historical Provider, MD  sulfamethoxazole-trimethoprim (BACTRIM DS) 800-160 MG per tablet Take 1 tablet by mouth 2 (two) times daily. 12/20/13   Anastasio Auerbach, MD  SYNTHROID 75 MCG tablet Take 1 tablet (75 mcg total) by mouth daily before breakfast. 09/28/13   Armandina Gemma, MD   Ht 5' 8"  (1.727 m)  Wt 160 lb (72.576 kg)  BMI 24.33 kg/m2 Physical Exam  Constitutional: She is oriented to person, place, and time. She appears well-developed and well-nourished.  Non-toxic appearance. No distress.  HENT:  Head: Normocephalic and atraumatic.  Eyes: Conjunctivae, EOM and lids are normal. Pupils are equal, round, and reactive to light.  Neck: Normal range of motion. Neck supple. No tracheal deviation present. No thyroid mass present.  Cardiovascular: Normal rate, regular rhythm and normal heart sounds.  Exam reveals no gallop.   No murmur heard. Pulmonary/Chest: Effort normal and breath sounds normal. No stridor. No respiratory distress. She has no decreased breath sounds. She has no wheezes. She has no rhonchi. She has  no rales.  Abdominal: Soft. Normal appearance and bowel sounds are normal. She exhibits no distension. There is no tenderness. There is no rebound and no CVA tenderness.  Musculoskeletal: Normal range of motion. She exhibits no edema or tenderness.  Neurological: She is alert and oriented to person, place, and time. She has normal strength. No cranial nerve deficit or sensory deficit. GCS eye subscore is 4. GCS verbal subscore is 5. GCS motor subscore is 6.  Skin: Skin is warm and dry. No abrasion and no rash noted.  Psychiatric: She has a normal mood and affect. Her speech is normal and behavior is normal.  Nursing note and vitals reviewed.   ED Course  Procedures (including critical care time) Labs Review Labs Reviewed  CBC WITH DIFFERENTIAL  COMPREHENSIVE METABOLIC PANEL  I-STAT [place], ED    Imaging Review No results found.   EKG Interpretation None      MDM   Final diagnoses:  Palpitations     Date: 03/15/2014  Rate: 96  Rhythm: normal sinus rhythm  QRS Axis: normal  Intervals: normal  ST/T Wave abnormalities: normal  Conduction Disutrbances:none  Narrative Interpretation:   Old EKG Reviewed: none available   Patient seen by cardiology, Dr. Stanford Breed, and patient be started on Toprol-XL 50 mg a day as well as aspirin and has been given outpatient referral to cardiology   Leota Jacobsen, MD 03/15/14 1335

## 2014-03-15 NOTE — ED Notes (Signed)
Dr. Zenia Resides at bedside.

## 2014-03-15 NOTE — Discharge Instructions (Signed)
Take an aspirin a day and follow-up with the cardiologist  Atrial Fibrillation Atrial fibrillation is a type of irregular heart rhythm (arrhythmia). During atrial fibrillation, the upper chambers of the heart (atria) quiver continuously in a chaotic pattern. This causes an irregular and often rapid heart rate.  Atrial fibrillation is the result of the heart becoming overloaded with disorganized signals that tell it to beat. These signals are normally released one at a time by a part of the right atrium called the sinoatrial node. They then travel from the atria to the lower chambers of the heart (ventricles), causing the atria and ventricles to contract and pump blood as they pass. In atrial fibrillation, parts of the atria outside of the sinoatrial node also release these signals. This results in two problems. First, the atria receive so many signals that they do not have time to fully contract. Second, the ventricles, which can only receive one signal at a time, beat irregularly and out of rhythm with the atria.  There are three types of atrial fibrillation:   Paroxysmal. Paroxysmal atrial fibrillation starts suddenly and stops on its own within a week.  Persistent. Persistent atrial fibrillation lasts for more than a week. It may stop on its own or with treatment.  Permanent. Permanent atrial fibrillation does not go away. Episodes of atrial fibrillation may lead to permanent atrial fibrillation. Atrial fibrillation can prevent your heart from pumping blood normally. It increases your risk of stroke and can lead to heart failure.  CAUSES   Heart conditions, including a heart attack, heart failure, coronary artery disease, and heart valve conditions.   Inflammation of the sac that surrounds the heart (pericarditis).  Blockage of an artery in the lungs (pulmonary embolism).  Pneumonia or other infections.  Chronic lung disease.  Thyroid problems, especially if the thyroid is overactive  (hyperthyroidism).  Caffeine, excessive alcohol use, and use of some illegal drugs.   Use of some medicines, including certain decongestants and diet pills.  Heart surgery.   Birth defects.  Sometimes, no cause can be found. When this happens, the atrial fibrillation is called lone atrial fibrillation. The risk of complications from atrial fibrillation increases if you have lone atrial fibrillation and you are age 59 years or older. RISK FACTORS  Heart failure.  Coronary artery disease.  Diabetes mellitus.   High blood pressure (hypertension).   Obesity.   Other arrhythmias.   Increased age. SIGNS AND SYMPTOMS   A feeling that your heart is beating rapidly or irregularly.   A feeling of discomfort or pain in your chest.   Shortness of breath.   Sudden light-headedness or weakness.   Getting tired easily when exercising.   Urinating more often than normal (mainly when atrial fibrillation first begins).  In paroxysmal atrial fibrillation, symptoms may start and suddenly stop. DIAGNOSIS  Your health care provider may be able to detect atrial fibrillation when taking your pulse. Your health care provider may have you take a test called an ambulatory electrocardiogram (ECG). An ECG records your heartbeat patterns over a 24-hour period. You may also have other tests, such as:  Transthoracic echocardiogram (TTE). During echocardiography, sound waves are used to evaluate how blood flows through your heart.  Transesophageal echocardiogram (TEE).  Stress test. There is more than one type of stress test. If a stress test is needed, ask your health care provider about which type is best for you.  Chest X-ray exam.  Blood tests.  Computed tomography (CT). TREATMENT  Treatment  may include:  Treating any underlying conditions. For example, if you have an overactive thyroid, treating the condition may correct atrial fibrillation.  Taking medicine. Medicines may  be given to control a rapid heart rate or to prevent blood clots, heart failure, or a stroke.  Having a procedure to correct the rhythm of the heart:  Electrical cardioversion. During electrical cardioversion, a controlled, low-energy shock is delivered to the heart through your skin. If you have chest pain, very low blood pressure, or sudden heart failure, this procedure may need to be done as an emergency.  Catheter ablation. During this procedure, heart tissues that send the signals that cause atrial fibrillation are destroyed.  Surgical ablation. During this surgery, thin lines of heart tissue that carry the abnormal signals are destroyed. This procedure can either be an open-heart surgery or a minimally invasive surgery. With the minimally invasive surgery, small cuts are made to access the heart instead of a large opening.  Pulmonary venous isolation. During this surgery, tissue around the veins that carry blood from the lungs (pulmonary veins) is destroyed. This tissue is thought to carry the abnormal signals. HOME CARE INSTRUCTIONS   Take medicines only as directed by your health care provider. Some medicines can make atrial fibrillation worse or recur.  If blood thinners were prescribed by your health care provider, take them exactly as directed. Too much blood-thinning medicine can cause bleeding. If you take too little, you will not have the needed protection against stroke and other problems.  Perform blood tests at home if directed by your health care provider. Perform blood tests exactly as directed.  Quit smoking if you smoke.  Do not drink alcohol.  Do not drink caffeinated beverages such as coffee, soda, and some teas. You may drink decaffeinated coffee, soda, or tea.   Maintain a healthy weight.Do not use diet pills unless your health care provider approves. They may make heart problems worse.   Follow diet instructions as directed by your health care  provider.  Exercise regularly as directed by your health care provider.  Keep all follow-up visits as directed by your health care provider. This is important. PREVENTION  The following substances can cause atrial fibrillation to recur:   Caffeinated beverages.  Alcohol.  Certain medicines, especially those used for breathing problems.  Certain herbs and herbal medicines, such as those containing ephedra or ginseng.  Illegal drugs, such as cocaine and amphetamines. Sometimes medicines are given to prevent atrial fibrillation from recurring. Proper treatment of any underlying condition is also important in helping prevent recurrence.  SEEK MEDICAL CARE IF:  You notice a change in the rate, rhythm, or strength of your heartbeat.  You suddenly begin urinating more frequently.  You tire more easily when exerting yourself or exercising. SEEK IMMEDIATE MEDICAL CARE IF:   You have chest pain, abdominal pain, sweating, or weakness.  You feel nauseous.  You have shortness of breath.  You suddenly have swollen feet and ankles.  You feel dizzy.  Your face or limbs feel numb or weak.  You have a change in your vision or speech. MAKE SURE YOU:   Understand these instructions.  Will watch your condition.  Will get help right away if you are not doing well or get worse. Document Released: 02/11/2005 Document Revised: 06/28/2013 Document Reviewed: 03/24/2012 [facility] Patient Information 2015 [place], [place]. This information is not intended to replace advice given to you by your health care provider. Make sure you discuss any questions you have  with your health care provider.

## 2014-03-16 ENCOUNTER — Telehealth: Payer: Self-pay | Admitting: Cardiology

## 2014-03-16 NOTE — Telephone Encounter (Signed)
Closed encounter °

## 2014-04-15 ENCOUNTER — Ambulatory Visit: Payer: Commercial Indemnity | Admitting: Cardiology

## 2014-07-05 ENCOUNTER — Telehealth: Payer: Self-pay | Admitting: Gynecology

## 2014-07-05 NOTE — Telephone Encounter (Signed)
Prolia injection due after 07/01/14 last injection 12/30/13. Insurance benefits no Deductible , co pay $50 with or without OV. OOP MAX $[phone] met). Phone call to pt insurance requires PA. [employer] . All information has been faxed waiting for PA.

## 2014-07-20 ENCOUNTER — Encounter: Payer: Self-pay | Admitting: Gynecology

## 2014-07-20 NOTE — Telephone Encounter (Signed)
Received PA from insurance Christella Scheuermann) , Ref# [id number]  Effective dates 07/07/14 thru 07/07/2015  Phone call to pt left message. Injection due now.

## 2014-07-28 NOTE — Telephone Encounter (Signed)
Left message for patient to call and schedule Prolia injection

## 2014-08-05 NOTE — Telephone Encounter (Signed)
Left message again regarding Prolia injection. Request made to pt to make appointment and inform when she would like to come in.

## 2014-08-08 NOTE — Telephone Encounter (Signed)
There are alternatives that we can talk about. If she wants to wait to the fall then that's fine

## 2014-08-08 NOTE — Telephone Encounter (Signed)
Patient left voicemail stating that she will no longer take Prolia due to muscle and joint pain. She will discuss this with Dr Phineas Real at her annual exam in October. Also stated that she had not received any of previous messages left on her home phone and mobile phone. I left message on her voicemail that I will forward this to Dr Phineas Real in regards to Prolia injection for this year.

## 2014-08-08 NOTE — Telephone Encounter (Signed)
Called and left message regarding Prolia alternatives per Dr Zelphia Cairo note. Instructed patient that she could wait until October or call and schedule an appointment with Dr Phineas Real to discuss alternatives.

## 2014-09-20 ENCOUNTER — Telehealth: Payer: Self-pay | Admitting: Gynecology

## 2014-09-20 NOTE — Telephone Encounter (Signed)
Left VM message to have pt. Call in regards to recent drop in office visit requesting to re start Prolia injections. I have asked her to give me more information regarding her prolia request to inform Dr Phineas Real. She has stopped her Prolia injections due to side effects previously.

## 2014-09-21 NOTE — Telephone Encounter (Signed)
Okay to restart

## 2014-09-21 NOTE — Telephone Encounter (Signed)
VM from pt she would like to restart Prolia. Does not want to wait until complete exam in Oct. In May 2016 phone call from patient that she no longer would receive Prolia injections. She does not want to come in for OV to discuss. Please advise this request  Dr Phineas Real.

## 2014-09-22 NOTE — Telephone Encounter (Addendum)
Left VM message told patient she could come in for injection, PA approved, $50 co pay with or without OV. Asked her to call me to schedule this. Will also let Claudia know.Calcium level 8.9 02/2014

## 2014-09-27 NOTE — Telephone Encounter (Signed)
VM from pt request Prolia injection Aug 8 or 9th in am. Will forward to [facility].

## 2014-10-10 ENCOUNTER — Ambulatory Visit (INDEPENDENT_AMBULATORY_CARE_PROVIDER_SITE_OTHER): Payer: Managed Care, Other (non HMO) | Admitting: Gynecology

## 2014-10-10 DIAGNOSIS — M810 Age-related osteoporosis without current pathological fracture: Secondary | ICD-10-CM | POA: Diagnosis not present

## 2014-10-10 MED ORDER — DENOSUMAB 60 MG/ML ~~LOC~~ SOLN
60.0000 mg | Freq: Once | SUBCUTANEOUS | Status: AC
  Administered 2014-10-10: 60 mg via SUBCUTANEOUS

## 2014-10-11 NOTE — Telephone Encounter (Signed)
Robin [name] received her Prolia injection 10/10/2014 next injection due on 04/13/2015, She will need repeat Calcium and PA thru insurance.

## 2014-11-29 ENCOUNTER — Other Ambulatory Visit: Payer: Self-pay

## 2014-11-29 DIAGNOSIS — Z1231 Encounter for screening mammogram for malignant neoplasm of breast: Secondary | ICD-10-CM

## 2014-12-19 ENCOUNTER — Encounter: Payer: Self-pay | Admitting: Gynecology

## 2014-12-19 ENCOUNTER — Ambulatory Visit (INDEPENDENT_AMBULATORY_CARE_PROVIDER_SITE_OTHER): Payer: Managed Care, Other (non HMO) | Admitting: Gynecology

## 2014-12-19 VITALS — BP 130/80 | Ht 65.0 in | Wt 150.0 lb

## 2014-12-19 DIAGNOSIS — Z01419 Encounter for gynecological examination (general) (routine) without abnormal findings: Secondary | ICD-10-CM

## 2014-12-19 DIAGNOSIS — M810 Age-related osteoporosis without current pathological fracture: Secondary | ICD-10-CM

## 2014-12-19 NOTE — Progress Notes (Signed)
Robin [name] [birth year] [id number]        59 y.o.  G0P0 for annual exam.  Doing well without complaints  Past medical history,surgical history, problem list, medications, allergies, family history and social history were all reviewed and documented as reviewed in the EPIC chart.  ROS:  Performed with pertinent positives and negatives included in the history, assessment and plan.   Additional significant findings :  none   Exam: [name] Counsellor Vitals:   12/19/14 0900  BP: 130/80  Height: 5' 5"  (1.651 m)  Weight: 150 lb (68.04 kg)   General appearance:  Normal affect, orientation and appearance. Skin: Grossly normal HEENT: Without gross lesions.  No cervical or supraclavicular adenopathy. Thyroid normal.  Lungs:  Clear without wheezing, rales or rhonchi Cardiac: RR, without RMG Abdominal:  Soft, nontender, without masses, guarding, rebound, organomegaly or hernia Breasts:  Examined lying and sitting without masses, retractions, discharge or axillary adenopathy. Pelvic:  Ext/BUS/vagina with atrophic changes  Cervix with atrophic changes  Uterus anteverted, normal size, shape and contour, midline and mobile nontender   Adnexa  Without masses or tenderness    Anus and perineum  Normal   Rectovaginal  Normal sphincter tone without palpated masses or tenderness.    Assessment/Plan:  59 y.o. G0P0 female for annual exam.   1. Postmenopausal/atrophic genital changes. Patient doing well without significant hot flushes, night sweats, vaginal dryness or any vaginal bleeding. Continue to monitor report any issues 2. Osteoporosis.  DEXA 2014 T score -2.4 improved from prior DEXA -2.6 on Prolia started 2013. Repeat DEXA now at 2 year interval. Continue Prolia for now. 3. History of safety coil IUD placed 1983. Attempted removal by Dr. Cherylann Banas unsuccessful. They decided to leave it in place that she is having no issues. Patient doing well without issues. Continue to  monitor. 4. Mammography coming due in November and she knows to call and schedule this. SBE monthly reviewed. 5. Colonoscopy 2015. Repeat at their recommended interval. 6. Pap smear/HPV 2015 negative. No history of significant abnormal Pap smears. Plan repeat at 5 your interval per current screening guidelines. 7. Health maintenance. No routine blood work done as patient has since done at her primary physician's office. Follow up for bone density otherwise 1 year for annual exam, sooner as needed.   Anastasio Auerbach MD, 9:27 AM 12/19/2014

## 2014-12-19 NOTE — Patient Instructions (Signed)
Follow up for bone density as scheduled. Continue on the Prolia for now.  You may obtain a copy of any labs that were done today by logging onto MyChart as outlined in the instructions provided with your AVS (after visit summary). The office will not call with normal lab results but certainly if there are any significant abnormalities then we will contact you.   Health Maintenance Adopting a healthy lifestyle and getting preventive care can go a long way to promote health and wellness. Talk with your health care provider about what schedule of regular examinations is right for you. This is a good chance for you to check in with your provider about disease prevention and staying healthy. In between checkups, there are plenty of things you can do on your own. Experts have done a lot of research about which lifestyle changes and preventive measures are most likely to keep you healthy. Ask your health care provider for more information. WEIGHT AND DIET  Eat a healthy diet  Be sure to include plenty of vegetables, fruits, low-fat dairy products, and lean protein.  Do not eat a lot of foods high in solid fats, added sugars, or salt.  Get regular exercise. This is one of the most important things you can do for your health.  Most adults should exercise for at least 150 minutes each week. The exercise should increase your heart rate and make you sweat (moderate-intensity exercise).  Most adults should also do strengthening exercises at least twice a week. This is in addition to the moderate-intensity exercise.  Maintain a healthy weight  Body mass index (BMI) is a measurement that can be used to identify possible weight problems. It estimates body fat based on height and weight. Your health care provider can help determine your BMI and help you achieve or maintain a healthy weight.  For females 4 years of age and older:   A BMI below 18.5 is considered underweight.  A BMI of 18.5 to 24.9 is  normal.  A BMI of 25 to 29.9 is considered overweight.  A BMI of 30 and above is considered obese.  Watch levels of cholesterol and blood lipids  You should start having your blood tested for lipids and cholesterol at 59 years of age, then have this test every 5 years.  You may need to have your cholesterol levels checked more often if:  Your lipid or cholesterol levels are high.  You are older than 59 years of age.  You are at high risk for heart disease.  CANCER SCREENING   Lung Cancer  Lung cancer screening is recommended for adults 48-61 years old who are at high risk for lung cancer because of a history of smoking.  A yearly low-dose CT scan of the lungs is recommended for people who:  Currently smoke.  Have quit within the past 15 years.  Have at least a 30-pack-year history of smoking. A pack year is smoking an average of one pack of cigarettes a day for 1 year.  Yearly screening should continue until it has been 15 years since you quit.  Yearly screening should stop if you develop a health problem that would prevent you from having lung cancer treatment.  Breast Cancer  Practice breast self-awareness. This means understanding how your breasts normally appear and feel.  It also means doing regular breast self-exams. Let your health care provider know about any changes, no matter how small.  If you are in your 20s or 30s, you  should have a clinical breast exam (CBE) by a health care provider every 1-3 years as part of a regular health exam.  If you are 72 or older, have a CBE every year. Also consider having a breast X-ray (mammogram) every year.  If you have a family history of breast cancer, talk to your health care provider about genetic screening.  If you are at high risk for breast cancer, talk to your health care provider about having an MRI and a mammogram every year.  Breast cancer gene (BRCA) assessment is recommended for women who have family members  with BRCA-related cancers. BRCA-related cancers include:  Breast.  Ovarian.  Tubal.  Peritoneal cancers.  Results of the assessment will determine the need for genetic counseling and BRCA1 and BRCA2 testing. Cervical Cancer Routine pelvic examinations to screen for cervical cancer are no longer recommended for nonpregnant women who are considered low risk for cancer of the pelvic organs (ovaries, uterus, and vagina) and who do not have symptoms. A pelvic examination may be necessary if you have symptoms including those associated with pelvic infections. Ask your health care provider if a screening pelvic exam is right for you.   The Pap test is the screening test for cervical cancer for women who are considered at risk.  If you had a hysterectomy for a problem that was not cancer or a condition that could lead to cancer, then you no longer need Pap tests.  If you are older than 65 years, and you have had normal Pap tests for the past 10 years, you no longer need to have Pap tests.  If you have had past treatment for cervical cancer or a condition that could lead to cancer, you need Pap tests and screening for cancer for at least 20 years after your treatment.  If you no longer get a Pap test, assess your risk factors if they change (such as having a new sexual partner). This can affect whether you should start being screened again.  Some women have medical problems that increase their chance of getting cervical cancer. If this is the case for you, your health care provider may recommend more frequent screening and Pap tests.  The human papillomavirus (HPV) test is another test that may be used for cervical cancer screening. The HPV test looks for the virus that can cause cell changes in the cervix. The cells collected during the Pap test can be tested for HPV.  The HPV test can be used to screen women 72 years of age and older. Getting tested for HPV can extend the interval between normal  Pap tests from three to five years.  An HPV test also should be used to screen women of any age who have unclear Pap test results.  After 59 years of age, women should have HPV testing as often as Pap tests.  Colorectal Cancer  This type of cancer can be detected and often prevented.  Routine colorectal cancer screening usually begins at 59 years of age and continues through 59 years of age.  Your health care provider may recommend screening at an earlier age if you have risk factors for colon cancer.  Your health care provider may also recommend using home test kits to check for hidden blood in the stool.  A small camera at the end of a tube can be used to examine your colon directly (sigmoidoscopy or colonoscopy). This is done to check for the earliest forms of colorectal cancer.  Routine screening  usually begins at age 31.  Direct examination of the colon should be repeated every 5-10 years through 59 years of age. However, you may need to be screened more often if early forms of precancerous polyps or small growths are found. Skin Cancer  Check your skin from head to toe regularly.  Tell your health care provider about any new moles or changes in moles, especially if there is a change in a mole's shape or color.  Also tell your health care provider if you have a mole that is larger than the size of a pencil eraser.  Always use sunscreen. Apply sunscreen liberally and repeatedly throughout the day.  Protect yourself by wearing long sleeves, pants, a wide-brimmed hat, and sunglasses whenever you are outside. HEART DISEASE, DIABETES, AND HIGH BLOOD PRESSURE   Have your blood pressure checked at least every 1-2 years. High blood pressure causes heart disease and increases the risk of stroke.  If you are between 53 years and 52 years old, ask your health care provider if you should take aspirin to prevent strokes.  Have regular diabetes screenings. This involves taking a blood  sample to check your fasting blood sugar level.  If you are at a normal weight and have a low risk for diabetes, have this test once every three years after 59 years of age.  If you are overweight and have a high risk for diabetes, consider being tested at a younger age or more often. PREVENTING INFECTION  Hepatitis B  If you have a higher risk for hepatitis B, you should be screened for this virus. You are considered at high risk for hepatitis B if:  You were born in a country where hepatitis B is common. Ask your health care provider which countries are considered high risk.  Your parents were born in a high-risk country, and you have not been immunized against hepatitis B (hepatitis B vaccine).  You have HIV or AIDS.  You use needles to inject street drugs.  You live with someone who has hepatitis B.  You have had sex with someone who has hepatitis B.  You get hemodialysis treatment.  You take certain medicines for conditions, including cancer, organ transplantation, and autoimmune conditions. Hepatitis C  Blood testing is recommended for:  Everyone born from [birth year] through 1965.  Anyone with known risk factors for hepatitis C. Sexually transmitted infections (STIs)  You should be screened for sexually transmitted infections (STIs) including gonorrhea and chlamydia if:  You are sexually active and are younger than 59 years of age.  You are older than 59 years of age and your health care provider tells you that you are at risk for this type of infection.  Your sexual activity has changed since you were last screened and you are at an increased risk for chlamydia or gonorrhea. Ask your health care provider if you are at risk.  If you do not have HIV, but are at risk, it may be recommended that you take a prescription medicine daily to prevent HIV infection. This is called pre-exposure prophylaxis (PrEP). You are considered at risk if:  You are sexually active and do not  regularly use condoms or know the HIV status of your partner(s).  You take drugs by injection.  You are sexually active with a partner who has HIV. Talk with your health care provider about whether you are at high risk of being infected with HIV. If you choose to begin PrEP, you should first be tested  for HIV. You should then be tested every 3 months for as long as you are taking PrEP.  PREGNANCY   If you are premenopausal and you may become pregnant, ask your health care provider about preconception counseling.  If you may become pregnant, take 400 to 800 micrograms (mcg) of folic acid every day.  If you want to prevent pregnancy, talk to your health care provider about birth control (contraception). OSTEOPOROSIS AND MENOPAUSE   Osteoporosis is a disease in which the bones lose minerals and strength with aging. This can result in serious bone fractures. Your risk for osteoporosis can be identified using a bone density scan.  If you are 63 years of age or older, or if you are at risk for osteoporosis and fractures, ask your health care provider if you should be screened.  Ask your health care provider whether you should take a calcium or vitamin D supplement to lower your risk for osteoporosis.  Menopause may have certain physical symptoms and risks.  Hormone replacement therapy may reduce some of these symptoms and risks. Talk to your health care provider about whether hormone replacement therapy is right for you.  HOME CARE INSTRUCTIONS   Schedule regular health, dental, and eye exams.  Stay current with your immunizations.   Do not use any tobacco products including cigarettes, chewing tobacco, or electronic cigarettes.  If you are pregnant, do not drink alcohol.  If you are breastfeeding, limit how much and how often you drink alcohol.  Limit alcohol intake to no more than 1 drink per day for nonpregnant women. One drink equals 12 ounces of beer, 5 ounces of wine, or 1  ounces of hard liquor.  Do not use street drugs.  Do not share needles.  Ask your health care provider for help if you need support or information about quitting drugs.  Tell your health care provider if you often feel depressed.  Tell your health care provider if you have ever been abused or do not feel safe at home. Document Released: 08/27/2010 Document Revised: 06/28/2013 Document Reviewed: 01/13/2013 [facility] Patient Information 2015 [place], [place]. This information is not intended to replace advice given to you by your health care provider. Make sure you discuss any questions you have with your health care provider.

## 2014-12-20 LAB — URINALYSIS W MICROSCOPIC + REFLEX CULTURE
BACTERIA UA: NONE SEEN [HPF]
Bilirubin Urine: NEGATIVE
CASTS: NONE SEEN [LPF]
CRYSTALS: NONE SEEN [HPF]
Glucose, UA: NEGATIVE
HGB URINE DIPSTICK: NEGATIVE
KETONES UR: NEGATIVE
Nitrite: NEGATIVE
PH: 6 (ref 5.0–8.0)
PROTEIN: NEGATIVE
Specific Gravity, Urine: 1.011 (ref 1.001–1.035)
Squamous Epithelial / LPF: NONE SEEN [HPF] (ref <=5)
WBC, UA: NONE SEEN WBC/HPF (ref <=5)
YEAST: NONE SEEN [HPF]

## 2014-12-22 ENCOUNTER — Other Ambulatory Visit: Payer: Self-pay | Admitting: *Deleted

## 2014-12-22 DIAGNOSIS — N390 Urinary tract infection, site not specified: Secondary | ICD-10-CM

## 2014-12-22 LAB — URINE CULTURE: Colony Count: >=100000

## 2014-12-22 MED ORDER — SULFAMETHOXAZOLE-TRIMETHOPRIM 800-160 MG PO TABS: 1.0000 | ORAL_TABLET | Freq: Two times a day (BID) | ORAL | Status: DC

## 2014-12-26 ENCOUNTER — Other Ambulatory Visit: Payer: Self-pay | Admitting: Surgery

## 2014-12-26 DIAGNOSIS — E041 Nontoxic single thyroid nodule: Secondary | ICD-10-CM

## 2014-12-27 ENCOUNTER — Other Ambulatory Visit: Payer: Managed Care, Other (non HMO)

## 2014-12-29 ENCOUNTER — Ambulatory Visit (INDEPENDENT_AMBULATORY_CARE_PROVIDER_SITE_OTHER): Payer: Managed Care, Other (non HMO)

## 2014-12-29 ENCOUNTER — Other Ambulatory Visit: Payer: Managed Care, Other (non HMO)

## 2014-12-29 DIAGNOSIS — M810 Age-related osteoporosis without current pathological fracture: Secondary | ICD-10-CM

## 2014-12-29 DIAGNOSIS — N390 Urinary tract infection, site not specified: Secondary | ICD-10-CM

## 2014-12-30 ENCOUNTER — Encounter: Payer: Self-pay | Admitting: Gynecology

## 2014-12-30 LAB — URINALYSIS W MICROSCOPIC + REFLEX CULTURE
Bacteria, UA: NONE SEEN [HPF]
Bilirubin Urine: NEGATIVE
CASTS: NONE SEEN [LPF]
CRYSTALS: NONE SEEN [HPF]
Glucose, UA: NEGATIVE
HGB URINE DIPSTICK: NEGATIVE
KETONES UR: NEGATIVE
Leukocytes, UA: NEGATIVE
NITRITE: NEGATIVE
PH: 6 (ref 5.0–8.0)
PROTEIN: NEGATIVE
SQUAMOUS EPITHELIAL / LPF: NONE SEEN [HPF] (ref <=5)
Specific Gravity, Urine: 1.005 (ref 1.001–1.035)
WBC, UA: NONE SEEN WBC/HPF (ref <=5)
YEAST: NONE SEEN [HPF]

## 2015-01-01 LAB — URINE CULTURE

## 2015-01-02 ENCOUNTER — Ambulatory Visit
Admission: RE | Admit: 2015-01-02 | Discharge: 2015-01-02 | Disposition: A | Discharge status: Home / Self Care | Payer: Managed Care, Other (non HMO) | Source: Ambulatory Visit

## 2015-01-02 DIAGNOSIS — Z1231 Encounter for screening mammogram for malignant neoplasm of breast: Secondary | ICD-10-CM

## 2015-01-03 ENCOUNTER — Telehealth: Payer: Self-pay

## 2015-01-03 NOTE — Telephone Encounter (Signed)
We can wait for sensitivities to return or she can take the antibiotics. Regardless I would recommend a clean-catch urine culture several months from now to make sure things remain clear.

## 2015-01-03 NOTE — Telephone Encounter (Signed)
Patient called today regarding urine culture results. I had sent the result note back to you last night to see if you wanted to prescribe different medication.  Patient called today and on discussion told me that for the last 3 years we have called her and told her she has UTI and she takes Rx and never rechecks urine.  She asked to be able to recheck it this year and that is what this result was.  She is pretty much asymptomatic. Occasionally she will have burning or frequency but never related it to this.  I told her you will likely want to wait on sensitivities and see what the bacteria is sensitive to and then recommend Rx but that I would check with you.

## 2015-01-03 NOTE — Telephone Encounter (Signed)
Patient wants to wait for sensitivities to be sure she is on best Rx.

## 2015-01-04 ENCOUNTER — Other Ambulatory Visit: Payer: Self-pay

## 2015-01-04 ENCOUNTER — Other Ambulatory Visit: Payer: Self-pay | Admitting: Gynecology

## 2015-01-04 DIAGNOSIS — N390 Urinary tract infection, site not specified: Secondary | ICD-10-CM

## 2015-01-04 DIAGNOSIS — N3001 Acute cystitis with hematuria: Secondary | ICD-10-CM

## 2015-01-04 MED ORDER — NITROFURANTOIN MONOHYD MACRO 100 MG PO CAPS: 100.0000 mg | ORAL_CAPSULE | Freq: Two times a day (BID) | ORAL | Status: DC

## 2015-01-12 ENCOUNTER — Ambulatory Visit
Admission: RE | Admit: 2015-01-12 | Discharge: 2015-01-12 | Disposition: A | Discharge status: Home / Self Care | Payer: Managed Care, Other (non HMO) | Source: Ambulatory Visit | Attending: Surgery | Admitting: Surgery

## 2015-01-12 DIAGNOSIS — E041 Nontoxic single thyroid nodule: Secondary | ICD-10-CM

## 2015-02-09 ENCOUNTER — Telehealth: Payer: Self-pay

## 2015-02-09 ENCOUNTER — Other Ambulatory Visit: Payer: Managed Care, Other (non HMO)

## 2015-02-09 DIAGNOSIS — N3001 Acute cystitis with hematuria: Secondary | ICD-10-CM

## 2015-02-09 NOTE — Telephone Encounter (Signed)
Faxed signed release to [facility] - Dr Thurman Coyer -  to obtain records per Dr Jacalyn Lefevre request. Faxed to [phone] on 02/09/28.

## 2015-02-10 ENCOUNTER — Telehealth: Payer: Self-pay | Admitting: Cardiology

## 2015-02-10 LAB — URINALYSIS W MICROSCOPIC + REFLEX CULTURE
BILIRUBIN URINE: NEGATIVE
Bacteria, UA: NONE SEEN [HPF]
Casts: NONE SEEN [LPF]
Crystals: NONE SEEN [HPF]
GLUCOSE, UA: NEGATIVE
HGB URINE DIPSTICK: NEGATIVE
Ketones, ur: NEGATIVE
NITRITE: NEGATIVE
PH: 5.5 (ref 5.0–8.0)
Protein, ur: NEGATIVE
SQUAMOUS EPITHELIAL / LPF: NONE SEEN [HPF] (ref <=5)
Specific Gravity, Urine: 1.01 (ref 1.001–1.035)
YEAST: NONE SEEN [HPF]

## 2015-02-10 NOTE — Telephone Encounter (Signed)
02/10/2015 Received a faxed referral packet from W. Tollie Eth, Jr for upcoming appointment with Dr. Stanford Breed on 05/08/2014.  Records given to [facility].  cbr

## 2015-02-13 ENCOUNTER — Other Ambulatory Visit: Payer: Self-pay | Admitting: Gynecology

## 2015-02-13 ENCOUNTER — Other Ambulatory Visit: Payer: Self-pay

## 2015-02-13 LAB — URINE CULTURE

## 2015-02-13 MED ORDER — SULFAMETHOXAZOLE-TRIMETHOPRIM 800-160 MG PO TABS: 1.0000 | ORAL_TABLET | Freq: Two times a day (BID) | ORAL | Status: DC

## 2015-02-22 ENCOUNTER — Other Ambulatory Visit: Payer: Self-pay | Admitting: Gynecology

## 2015-02-22 DIAGNOSIS — N3001 Acute cystitis with hematuria: Secondary | ICD-10-CM

## 2015-02-22 DIAGNOSIS — R8271 Bacteriuria: Secondary | ICD-10-CM

## 2015-02-22 DIAGNOSIS — N3000 Acute cystitis without hematuria: Secondary | ICD-10-CM

## 2015-02-23 ENCOUNTER — Other Ambulatory Visit: Payer: Managed Care, Other (non HMO)

## 2015-02-23 DIAGNOSIS — N3000 Acute cystitis without hematuria: Secondary | ICD-10-CM

## 2015-02-23 DIAGNOSIS — R8271 Bacteriuria: Secondary | ICD-10-CM

## 2015-02-24 LAB — URINALYSIS W MICROSCOPIC + REFLEX CULTURE
BILIRUBIN URINE: NEGATIVE
Bacteria, UA: NONE SEEN [HPF]
CASTS: NONE SEEN [LPF]
CRYSTALS: NONE SEEN [HPF]
Glucose, UA: NEGATIVE
Hgb urine dipstick: NEGATIVE
Ketones, ur: NEGATIVE
Leukocytes, UA: NEGATIVE
NITRITE: NEGATIVE
PH: 5.5 (ref 5.0–8.0)
Protein, ur: NEGATIVE
SPECIFIC GRAVITY, URINE: 1.025 (ref 1.001–1.035)
Squamous Epithelial / LPF: NONE SEEN [HPF] (ref <=5)
Yeast: NONE SEEN [HPF]

## 2015-02-25 LAB — URINE CULTURE
COLONY COUNT: NO GROWTH
ORGANISM ID, BACTERIA: NO GROWTH

## 2015-03-27 ENCOUNTER — Telehealth: Payer: Self-pay | Admitting: Gynecology

## 2015-03-27 NOTE — Telephone Encounter (Signed)
Prolia injection due after 04/13/2015  Calcium 9.6 on 09/12/2014/  Insurance information No deductible, Co pay $50 for Prolia , admin, and OV if needed . PA required Auth # [id number] valid 07/07/2014 thru 07/07/2015 for two injections (120 units).  DX M81.0

## 2015-04-04 NOTE — Telephone Encounter (Signed)
Benefits insurance cost for pt $50 with or without OV. Calcium 9.6 on 09/12/2014. PA needed Left VM for pt to call and schedule Prolia injection after 04/13/2015.

## 2015-04-05 ENCOUNTER — Encounter: Payer: Self-pay | Admitting: Gynecology

## 2015-04-17 NOTE — Telephone Encounter (Signed)
Appointment for Prolia 05/11/2015

## 2015-05-01 NOTE — Progress Notes (Signed)
HPI: Follow-up atrial fibrillation. Patient seen in the emergency room January 2016 with palpitations. She had an electrocardiogram that demonstrated fibrillation and converted spontaneously to sinus rhythm. At that time echocardiogram was recommended. She also had ST depression with her atrial fibrillation/tachycardia an outpatient stress echocardiogram was recommended. She did not return for either of these. Note TSH and free T4 were normal. Her only embolic risk factor was female sex and she was therefore not placed on anticoagulation. Patient followed up with Dr. Wynonia Lawman. She had an event monitor that did not show evidence of atrial fibrillation. Her echocardiogram in February 2016 showed normal LV function, mild left ventricular hypertrophy, atrial septum aneurysmal. Patient denies dyspnea on exertion, orthopnea, PND, pedal edema, chest pain, palpitations, syncope or bleeding.  Current Outpatient Prescriptions  Medication Sig Dispense Refill  . Biotin 5 MG CAPS Take 1 capsule by mouth daily.    Marland Kitchen denosumab (PROLIA) 60 MG/ML SOLN injection Inject 60 mg into the skin every 6 (six) months. Administer in upper arm, thigh, or abdomen    . nitrofurantoin, macrocrystal-monohydrate, (MACROBID) 100 MG capsule Take 1 capsule (100 mg total) by mouth 2 (two) times daily. 14 capsule 0  . sulfamethoxazole-trimethoprim (BACTRIM DS,SEPTRA DS) 800-160 MG tablet Take 1 tablet by mouth 2 (two) times daily. 6 tablet 0  . SYNTHROID 75 MCG tablet Take 1 tablet (75 mcg total) by mouth daily before breakfast. 90 tablet 3  . verapamil (CALAN-SR) 180 MG CR tablet Take 1 tablet by mouth daily.    Marland Kitchen VITAMIN E PO Take by mouth.     No current facility-administered medications for this visit.     Past Medical History  Diagnosis Date  . Asthma   . Arthritis   . Cancer ([place])     skin  . Crohn's disease ([place])   . IUD (intrauterine device) in place 1983    Safety coil per Dr. Valeta Harms note attempted to  retrieve but unable to do so  . Osteoporosis 12/2014     T score -2.6 stable from prior DEXA on Prolia  . Thyroid disease     Hypothyroid  . Hypertension   . Atrial fibrillation [facility])     Past Surgical History  Procedure Laterality Date  . Basal and squamous cell ca excised    . Fissure surg    . Colonoscopy N/A 07/01/2013    Procedure: COLONOSCOPY;  Surgeon: Garlan Fair, MD;  Location: [facility] ENDOSCOPY;  Service: Endoscopy;  Laterality: N/A;    Social History   Social History  . Marital Status: Married    Spouse Name: N/A  . Number of Children: N/A  . Years of Education: N/A   Occupational History  . Not on file.   Social History Main Topics  . Smoking status: Never Smoker   . Smokeless tobacco: Not on file  . Alcohol Use: 1.2 oz/week    2 Standard drinks or equivalent per week     Comment: Occasional  . Drug Use: No  . Sexual Activity: Yes    Birth Control/ Protection: Post-menopausal     Comment: Pt. declined sexual hx questions   Other Topics Concern  . Not on file   Social History Narrative    Family History  Problem Relation Age of Onset  . Colon cancer Mother   . Heart disease Maternal Grandmother   . Hypertension Maternal Grandmother   . Breast cancer Paternal Grandmother     age unknown  . Heart disease  Mother     Atrial fibrillation    ROS: no fevers or chills, productive cough, hemoptysis, dysphasia, odynophagia, melena, hematochezia, dysuria, hematuria, rash, seizure activity, orthopnea, PND, pedal edema, claudication. Remaining systems are negative.  Physical Exam: Well-developed well-nourished in no acute distress.  Skin is warm and dry.  HEENT is normal.  Neck is supple.  Chest is clear to auscultation with normal expansion.  Cardiovascular exam is regular rate and rhythm.  Abdominal exam nontender or distended. No masses palpated. Extremities show no edema. neuro grossly intact  ECG Sinus rhythm at a rate of 89. No ST  changes.

## 2015-05-04 ENCOUNTER — Telehealth: Payer: Self-pay | Admitting: Cardiology

## 2015-05-04 NOTE — Telephone Encounter (Signed)
Received records from Dr Tollie Eth for appointment on 05/08/15 with Dr Stanford Breed.  Records given to [facility] (medical records) for Dr Jacalyn Lefevre schedule on 05/08/15. lp

## 2015-05-08 ENCOUNTER — Telehealth: Payer: Self-pay | Admitting: Cardiology

## 2015-05-08 ENCOUNTER — Ambulatory Visit (INDEPENDENT_AMBULATORY_CARE_PROVIDER_SITE_OTHER): Payer: Managed Care, Other (non HMO) | Admitting: Cardiology

## 2015-05-08 ENCOUNTER — Encounter: Payer: Self-pay | Admitting: Cardiology

## 2015-05-08 VITALS — BP 140/84 | HR 89 | Ht 65.5 in | Wt 153.0 lb

## 2015-05-08 DIAGNOSIS — R072 Precordial pain: Secondary | ICD-10-CM | POA: Diagnosis not present

## 2015-05-08 DIAGNOSIS — I480 Paroxysmal atrial fibrillation: Secondary | ICD-10-CM | POA: Diagnosis not present

## 2015-05-08 DIAGNOSIS — I10 Essential (primary) hypertension: Secondary | ICD-10-CM | POA: Diagnosis not present

## 2015-05-08 NOTE — Assessment & Plan Note (Signed)
Blood pressure controlled. Continue present medications. 

## 2015-05-08 NOTE — Telephone Encounter (Signed)
Returned call to patient. Informed her that there were no instructions to HOLD any medications prior to stress test. Advised she should take her normal meds as prescribed.

## 2015-05-08 NOTE — Assessment & Plan Note (Signed)
She has not had further chest pain. She did have ST changes when in atrial fibrillation in the emergency room. Will arrange exercise treadmill.

## 2015-05-08 NOTE — Telephone Encounter (Signed)
New message    Patient asking should she take her medication prior to coming in the office on  4.6.2017 for exercise tolerance test.

## 2015-05-08 NOTE — Patient Instructions (Signed)
Schedule exercise treadmill   Your physician wants you to follow-up in: 1 year. You will receive a reminder letter in the mail two months in advance. If you don't receive a letter, please call our office to schedule the follow-up appointment.

## 2015-05-08 NOTE — Assessment & Plan Note (Signed)
Patient remains in sinus rhythm. Continue verapamil. She did not tolerate Toprol because of fatigue. Embolic risk factors include hypertension and female sex. CHADSvasc 2. I have recommended anticoagulation with apixaban. She declines. She understands the higher risk of stroke. She will take aspirin 325 mg daily.

## 2015-05-11 ENCOUNTER — Ambulatory Visit (INDEPENDENT_AMBULATORY_CARE_PROVIDER_SITE_OTHER): Payer: Managed Care, Other (non HMO) | Admitting: Gynecology

## 2015-05-11 DIAGNOSIS — M810 Age-related osteoporosis without current pathological fracture: Secondary | ICD-10-CM

## 2015-05-11 MED ORDER — DENOSUMAB 60 MG/ML ~~LOC~~ SOLN
60.0000 mg | Freq: Once | SUBCUTANEOUS | Status: AC
  Administered 2015-05-11: 60 mg via SUBCUTANEOUS

## 2015-05-11 NOTE — Telephone Encounter (Signed)
Prolia Injection given , next injection due after 11-11-15. She will also need Calcium level also at PCP. I called pt and informed her to have PCP do calcium level at her annual exam.

## 2015-05-26 ENCOUNTER — Other Ambulatory Visit: Payer: Self-pay | Admitting: Orthopedic Surgery

## 2015-05-26 DIAGNOSIS — IMO0002 Reserved for concepts with insufficient information to code with codable children: Secondary | ICD-10-CM

## 2015-05-26 DIAGNOSIS — R229 Localized swelling, mass and lump, unspecified: Principal | ICD-10-CM

## 2015-06-01 ENCOUNTER — Ambulatory Visit (HOSPITAL_COMMUNITY)
Admission: RE | Admit: 2015-06-01 | Discharge: 2015-06-01 | Disposition: A | Discharge status: Home / Self Care | Payer: Managed Care, Other (non HMO) | Source: Ambulatory Visit | Attending: Cardiology | Admitting: Cardiology

## 2015-06-01 DIAGNOSIS — I480 Paroxysmal atrial fibrillation: Secondary | ICD-10-CM | POA: Diagnosis not present

## 2015-06-01 LAB — EXERCISE TOLERANCE TEST
CHL CUP MPHR: 161 {beats}/min
CSEPED: 9 min
CSEPPHR: 171 {beats}/min
Estimated workload: 10.1 METS
Percent HR: 106 %
RPE: 15
Rest HR: 125 {beats}/min

## 2015-07-10 ENCOUNTER — Encounter: Payer: Self-pay | Admitting: Gynecology

## 2015-07-10 ENCOUNTER — Telehealth: Payer: Self-pay | Admitting: *Deleted

## 2015-07-10 ENCOUNTER — Ambulatory Visit (INDEPENDENT_AMBULATORY_CARE_PROVIDER_SITE_OTHER): Payer: Managed Care, Other (non HMO) | Admitting: Gynecology

## 2015-07-10 VITALS — BP 120/76

## 2015-07-10 DIAGNOSIS — N644 Mastodynia: Secondary | ICD-10-CM

## 2015-07-10 NOTE — Telephone Encounter (Signed)
-----   Message from Anastasio Auerbach, MD sent at 07/10/2015  9:26 AM EDT ----- Schedule left breast diagnostic mammography and ultrasound reference tail of Spence pain

## 2015-07-10 NOTE — Telephone Encounter (Signed)
Orders placed at breast center they will contact pt to schedule. 

## 2015-07-10 NOTE — Patient Instructions (Signed)
Office will call you to arrange the mammogram/ultrasound. If you do not hear from the breast center within several days call my office.

## 2015-07-10 NOTE — Progress Notes (Signed)
    Robin [name] [birth year] [id number]        60 y.o.  G0P0 Presents with 8 month history of left breast pain. Aching and sore on and off. Notices at night when laying on her breasts or hugs somebody.  No palpable masses or nipple discharge. Reports similar episode years ago but had resolved.  3-D mammography 12/2014 normal.  Past medical history,surgical history, problem list, medications, allergies, family history and social history were all reviewed and documented in the EPIC chart.  Directed ROS with pertinent positives and negatives documented in the history of present illness/assessment and plan.  Exam: [name] assistant Filed Vitals:   07/10/15 0915  BP: 120/76   General appearance:  Normal Both breasts examined lying and sitting without masses, retractions, discharge, adenopathy.  Assessment/Plan:  60 y.o. G0P0 with persistent left breast mastalgia in the tail of Spence region. No palpable abnormalities. Recommend starting with diagnostic mammography/ultrasound. Patient knows to call if she does not hear to schedule within several days. If negative then we'll plan expectant management with SBE and reporting of any palpable abnormalities. Options for treatment to include nonsteroidal anti-inflammatories up to including androgens such as danazol discussed. At this point she preferred just observation as long as the studies were normal. If studies are not normal then will triage based on results.    Anastasio Auerbach MD, 9:27 AM 07/10/2015

## 2015-07-14 NOTE — Telephone Encounter (Signed)
appt 07/17/15 @ 2:40pm

## 2015-07-17 ENCOUNTER — Other Ambulatory Visit: Payer: Self-pay | Admitting: Gynecology

## 2015-07-17 ENCOUNTER — Ambulatory Visit
Admission: RE | Admit: 2015-07-17 | Discharge: 2015-07-17 | Disposition: A | Discharge status: Home / Self Care | Payer: Managed Care, Other (non HMO) | Source: Ambulatory Visit | Attending: Gynecology | Admitting: Gynecology

## 2015-07-17 DIAGNOSIS — N644 Mastodynia: Secondary | ICD-10-CM

## 2015-07-27 ENCOUNTER — Ambulatory Visit
Admission: RE | Admit: 2015-07-27 | Discharge: 2015-07-27 | Disposition: A | Discharge status: Home / Self Care | Payer: Managed Care, Other (non HMO) | Source: Ambulatory Visit | Attending: Gynecology | Admitting: Gynecology

## 2015-07-27 ENCOUNTER — Other Ambulatory Visit: Payer: Self-pay | Admitting: Gynecology

## 2015-07-27 DIAGNOSIS — N644 Mastodynia: Secondary | ICD-10-CM

## 2015-07-27 DIAGNOSIS — N6092 Unspecified benign mammary dysplasia of left breast: Secondary | ICD-10-CM

## 2015-07-27 HISTORY — DX: Unspecified benign mammary dysplasia of left breast: N60.92

## 2015-08-07 ENCOUNTER — Other Ambulatory Visit: Payer: Self-pay | Admitting: General Surgery

## 2015-08-07 DIAGNOSIS — N6092 Unspecified benign mammary dysplasia of left breast: Secondary | ICD-10-CM | POA: Insufficient documentation

## 2015-08-15 ENCOUNTER — Emergency Department (HOSPITAL_COMMUNITY): Payer: Managed Care, Other (non HMO)

## 2015-08-15 ENCOUNTER — Emergency Department (HOSPITAL_COMMUNITY)
Admission: EM | Admit: 2015-08-15 | Discharge: 2015-08-16 | Disposition: A | Discharge status: Home / Self Care | Payer: Managed Care, Other (non HMO) | Attending: Emergency Medicine | Admitting: Emergency Medicine

## 2015-08-15 ENCOUNTER — Encounter (HOSPITAL_COMMUNITY): Payer: Self-pay | Admitting: Emergency Medicine

## 2015-08-15 DIAGNOSIS — Z85820 Personal history of malignant melanoma of skin: Secondary | ICD-10-CM | POA: Insufficient documentation

## 2015-08-15 DIAGNOSIS — Z79899 Other long term (current) drug therapy: Secondary | ICD-10-CM | POA: Diagnosis not present

## 2015-08-15 DIAGNOSIS — J45909 Unspecified asthma, uncomplicated: Secondary | ICD-10-CM | POA: Insufficient documentation

## 2015-08-15 DIAGNOSIS — M546 Pain in thoracic spine: Secondary | ICD-10-CM

## 2015-08-15 DIAGNOSIS — I10 Essential (primary) hypertension: Secondary | ICD-10-CM | POA: Diagnosis not present

## 2015-08-15 DIAGNOSIS — R079 Chest pain, unspecified: Secondary | ICD-10-CM

## 2015-08-15 DIAGNOSIS — R0789 Other chest pain: Secondary | ICD-10-CM | POA: Diagnosis not present

## 2015-08-15 NOTE — ED Provider Notes (Signed)
CSN: [id number]     Arrival date & time 08/15/15  2044 History  By signing my name below, I, Emmanuella Mensah, attest that this documentation has been prepared under the direction and in the presence of Varney Biles, MD. Electronically Signed: Judithann Sauger, ED Scribe. 08/15/2015. 7:08 PM.    Chief Complaint  Patient presents with  . Shortness of Breath  . Chest Pain   Patient is a 60 y.o. female presenting with chest pain. The history is provided by the patient. No language interpreter was used.  Chest Pain Chest pain location: area under left breast. Pain quality: sharp   Pain radiates to:  Does not radiate Pain radiates to the back: no   Pain severity:  Moderate Onset quality:  Gradual Duration:  4 hours Timing:  Constant Progression:  Unchanged Chronicity:  New Context: breathing   Relieved by:  None tried Worsened by:  Nothing tried Ineffective treatments:  None tried Associated symptoms: cough, fever and shortness of breath   Associated symptoms: no nausea and not vomiting   Risk factors: no smoking    HPI Comments: AIJA SCARFO is a 60 y.o. female with a hx of asthma and HTN who presents to the Emergency Department complaining of sudden onset of ongoing constant non-radiating sharp and spasm pain to area under left breast onset 7:30 pm. She reports associated intermittent fever of 101, chills, and mild difficulty breathing. She adds that the pain is worse with deep breathing. Pt states that she has been having persistent non-productive cough, congestion, and URI symptoms for the past 2 weeks. No alleviating factors noted. Pt has not tried any medications PTA. She denies that she is current smoker. She denies any recent long travel. She reports multiple biopsy procedures within the last month. She denies any n/v, dysuria, or hematuria.    Past Medical History  Diagnosis Date  . Asthma   . Arthritis   . Cancer ([place])     skin  . Crohn's disease ([place])   . IUD  (intrauterine device) in place 1983    Safety coil per Dr. Valeta Harms note attempted to retrieve but unable to do so  . Osteoporosis 12/2014     T score -2.6 stable from prior DEXA on Prolia  . Thyroid disease     Hypothyroid  . Hypertension   . Atrial fibrillation [facility])    Past Surgical History  Procedure Laterality Date  . Basal and squamous cell ca excised    . Fissure surg    . Colonoscopy N/A 07/01/2013    Procedure: COLONOSCOPY;  Surgeon: Garlan Fair, MD;  Location: [facility];  Service: Endoscopy;  Laterality: N/A;   Family History  Problem Relation Age of Onset  . Colon cancer Mother   . Heart disease Maternal Grandmother   . Hypertension Maternal Grandmother   . Breast cancer Paternal Grandmother     age unknown  . Heart disease Mother     Atrial fibrillation   Social History  Substance Use Topics  . Smoking status: Never Smoker   . Smokeless tobacco: None  . Alcohol Use: 1.2 oz/week    2 Standard drinks or equivalent per week     Comment: Occasional   OB History    Gravida Para Term Preterm AB TAB SAB Ectopic Multiple Living   0              Review of Systems  Constitutional: Positive for fever.  Respiratory: Positive for cough and shortness of  breath.   Cardiovascular: Positive for chest pain.  Gastrointestinal: Negative for nausea and vomiting.   A complete 10 system review of systems was obtained and all systems are negative except as noted in the HPI and PMH.     Allergies  Tetracyclines & related; Biaxin; and Clarithromycin  Home Medications   Prior to Admission medications   Medication Sig Start Date End Date Taking? Authorizing Provider  Biotin 5 MG CAPS Take 5 mg by mouth daily.    Yes Historical Provider, MD  denosumab (PROLIA) 60 MG/ML SOLN injection Inject 60 mg into the skin every 6 (six) months. Administer in upper arm, thigh, or abdomen   Yes Historical Provider, MD  SYNTHROID 75 MCG tablet Take 1 tablet (75 mcg total) by mouth  daily before breakfast. 09/28/13  Yes Armandina Gemma, MD  ibuprofen (ADVIL,MOTRIN) 600 MG tablet Take 1 tablet (600 mg total) by mouth every 6 (six) hours as needed. 08/16/15   Varney Biles, MD  verapamil (CALAN-SR) 180 MG CR tablet Take 1 tablet (180 mg total) by mouth daily. 08/21/15   Lelon Perla, MD   BP 137/94 mmHg  Pulse 97  Temp(Src) 99.7 F (37.6 C) (Oral)  Resp 16  SpO2 97% Physical Exam  Constitutional: She is oriented to person, place, and time. She appears well-developed and well-nourished.  HENT:  Head: Normocephalic and atraumatic.  Cardiovascular: Regular rhythm.  Tachycardia present.   Pulmonary/Chest: Effort normal.  Neurological: She is alert and oriented to person, place, and time.  Skin: Skin is warm and dry.  Psychiatric: She has a normal mood and affect.  Nursing note and vitals reviewed.   ED Course  Procedures (including critical care time) DIAGNOSTIC STUDIES: Oxygen Saturation is 94% on RA, adequate by my interpretation.    COORDINATION OF CARE: 12:00 AM- Pt advised of plan for treatment and pt agrees.    Labs Review Labs Reviewed  CBC WITH DIFFERENTIAL/PLATELET - Abnormal; Notable for the following:    Hemoglobin 10.8 (*)    HCT 33.6 (*)    Platelets 442 (*)    All other components within normal limits  BASIC METABOLIC PANEL - Abnormal; Notable for the following:    Glucose, Bld 126 (*)    All other components within normal limits  URINALYSIS, ROUTINE W REFLEX MICROSCOPIC (NOT AT [facility]) - Abnormal; Notable for the following:    Hgb urine dipstick LARGE (*)    Leukocytes, UA MODERATE (*)    All other components within normal limits  D-DIMER, QUANTITATIVE (NOT AT [facility]) - Abnormal; Notable for the following:    D-Dimer, Quant 0.54 (*)    All other components within normal limits  URINE MICROSCOPIC-ADD ON - Abnormal; Notable for the following:    Squamous Epithelial / LPF 0-5 (*)    Bacteria, UA RARE (*)    Crystals CA OXALATE CRYSTALS (*)     All other components within normal limits  TROPONIN I    Imaging Review No results found. Varney Biles, MD has personally reviewed and evaluated these images and lab results as part of his medical decision-making.   EKG Interpretation None      MDM   Final diagnoses:  Chest pain, unspecified chest pain type  Acute thoracic back pain    I personally performed the services described in this documentation, which was scribed in my presence. The recorded information has been reviewed and is accurate.  Chest pain - atypical for cardiac etiology. Trop is neg. Pain x few  days. Dimer ordered - pt is having pleuritic chest pain. Dimer is +. CT Pe is neg. Pt has back pain - no clinical concerns for infection/trauma.    Varney Biles, MD 08/21/15 1910

## 2015-08-15 NOTE — ED Notes (Signed)
Pt ambulated to restroom without difficulty

## 2015-08-15 NOTE — ED Notes (Signed)
Pt presents with complaints of shortness of breath, shooting pain right under the left breast, low grade fever, and congestion. Pt states she thinks she has pneumonia.

## 2015-08-15 NOTE — ED Notes (Signed)
Pt refused EKG.  Stated "I think I have pneumonia, not a heart condition.  I see a heart doctor regularly and just saw him recently.  If I fall over into the floor, then by all means do it, but I refuse for one right now".

## 2015-08-16 ENCOUNTER — Emergency Department (HOSPITAL_COMMUNITY): Payer: Managed Care, Other (non HMO)

## 2015-08-16 ENCOUNTER — Other Ambulatory Visit: Payer: Self-pay | Admitting: General Surgery

## 2015-08-16 ENCOUNTER — Encounter (HOSPITAL_COMMUNITY): Payer: Self-pay

## 2015-08-16 DIAGNOSIS — N6092 Unspecified benign mammary dysplasia of left breast: Secondary | ICD-10-CM

## 2015-08-16 LAB — CBC WITH DIFFERENTIAL/PLATELET
Basophils Absolute: 0.1 10*3/uL (ref 0.0–0.1)
Basophils Relative: 1 %
EOS ABS: 0.1 10*3/uL (ref 0.0–0.7)
EOS PCT: 1 %
HCT: 33.6 % — ABNORMAL LOW (ref 36.0–46.0)
Hemoglobin: 10.8 g/dL — ABNORMAL LOW (ref 12.0–15.0)
LYMPHS ABS: 1.9 10*3/uL (ref 0.7–4.0)
LYMPHS PCT: 22 %
MCH: 27.3 pg (ref 26.0–34.0)
MCHC: 32.1 g/dL (ref 30.0–36.0)
MCV: 85.1 fL (ref 78.0–100.0)
MONO ABS: 1 10*3/uL (ref 0.1–1.0)
MONOS PCT: 12 %
Neutro Abs: 5.4 10*3/uL (ref 1.7–7.7)
Neutrophils Relative %: 64 %
PLATELETS: 442 10*3/uL — AB (ref 150–400)
RBC: 3.95 MIL/uL (ref 3.87–5.11)
RDW: 14.1 % (ref 11.5–15.5)
WBC: 8.4 10*3/uL (ref 4.0–10.5)

## 2015-08-16 LAB — URINALYSIS, ROUTINE W REFLEX MICROSCOPIC
BILIRUBIN URINE: NEGATIVE
Glucose, UA: NEGATIVE mg/dL
KETONES UR: NEGATIVE mg/dL
NITRITE: NEGATIVE
PH: 5.5 (ref 5.0–8.0)
Protein, ur: NEGATIVE mg/dL
SPECIFIC GRAVITY, URINE: 1.022 (ref 1.005–1.030)

## 2015-08-16 LAB — BASIC METABOLIC PANEL
Anion gap: 5 (ref 5–15)
BUN: 12 mg/dL (ref 6–20)
CALCIUM: 9 mg/dL (ref 8.9–10.3)
CO2: 26 mmol/L (ref 22–32)
CREATININE: 0.67 mg/dL (ref 0.44–1.00)
Chloride: 107 mmol/L (ref 101–111)
GFR calc Af Amer: >60 mL/min (ref >60)
GFR calc non Af Amer: >60 mL/min (ref >60)
GLUCOSE: 126 mg/dL — AB (ref 65–99)
POTASSIUM: 3.8 mmol/L (ref 3.5–5.1)
SODIUM: 138 mmol/L (ref 135–145)

## 2015-08-16 LAB — URINE MICROSCOPIC-ADD ON

## 2015-08-16 LAB — D-DIMER, QUANTITATIVE: D-Dimer, Quant: 0.54 ug/mL-FEU — ABNORMAL HIGH (ref 0.00–0.50)

## 2015-08-16 LAB — TROPONIN I: Troponin I: <0.03 ng/mL (ref <0.031)

## 2015-08-16 MED ORDER — IBUPROFEN 600 MG PO TABS: 600.0000 mg | ORAL_TABLET | Freq: Four times a day (QID) | ORAL | Status: DC | PRN

## 2015-08-16 MED ORDER — IOPAMIDOL (ISOVUE-370) INJECTION 76%
100.0000 mL | Freq: Once | INTRAVENOUS | Status: AC | PRN
  Administered 2015-08-16: 100 mL via INTRAVENOUS

## 2015-08-16 NOTE — ED Notes (Signed)
Pt ambulated to restroom. 

## 2015-08-16 NOTE — ED Notes (Signed)
Pt transported to CT ?

## 2015-08-16 NOTE — Discharge Instructions (Signed)
We saw you in the ER for the thoracic pain - front and back. All the results in the ER are normal, labs and imaging. We are not sure what is causing your symptoms. The workup in the ER is not complete, and is limited to screening for life threatening and emergent conditions only, so please see a primary care doctor for further evaluation.  THE CT SCAN DOES SHOW THE FOLLOWING FINDINGS FOR THE THYROID that needs extra attention by your primary doctor: IMPRESSION:  3. 2.1 cm mildly heterogeneous hypoattenuating focus at the left thyroid lobe. Consider further evaluation with thyroid ultrasound. If patient is clinically hyperthyroid, consider nuclear medicine thyroid uptake and scan.  Please return to the ER if you have worsening chest pain, shortness of breath, pain radiating to your jaw, shoulder, or back, sweats or fainting. Otherwise see the Cardiologist or your primary care doctor as requested.

## 2015-08-21 ENCOUNTER — Other Ambulatory Visit: Payer: Self-pay | Admitting: *Deleted

## 2015-08-21 MED ORDER — VERAPAMIL HCL ER 180 MG PO TBCR: 180.0000 mg | EXTENDED_RELEASE_TABLET | Freq: Every day | ORAL | Status: DC

## 2015-09-11 ENCOUNTER — Telehealth: Payer: Self-pay | Admitting: *Deleted

## 2015-09-11 NOTE — Telephone Encounter (Signed)
Spoke with pt, she wanted to make sure we were okay with her using the generic verapamil. Reassurance given to pt, okay to use.

## 2015-09-11 NOTE — Telephone Encounter (Signed)
Patient left a msg on the refill vm stating that she received the generic verapamil from [facility] and she was supposed to get the brand name. She can be reached at [phone]. Thanks, MI

## 2015-09-18 ENCOUNTER — Encounter (HOSPITAL_BASED_OUTPATIENT_CLINIC_OR_DEPARTMENT_OTHER): Payer: Self-pay | Admitting: *Deleted

## 2015-09-21 ENCOUNTER — Ambulatory Visit
Admission: RE | Admit: 2015-09-21 | Discharge: 2015-09-21 | Disposition: A | Discharge status: Home / Self Care | Payer: Managed Care, Other (non HMO) | Source: Ambulatory Visit | Attending: General Surgery | Admitting: General Surgery

## 2015-09-21 DIAGNOSIS — N6092 Unspecified benign mammary dysplasia of left breast: Secondary | ICD-10-CM

## 2015-09-21 NOTE — Progress Notes (Signed)
Boost drink given to patient with instructions to complete by 5am, pt verbalized understanding.

## 2015-09-25 ENCOUNTER — Ambulatory Visit (HOSPITAL_BASED_OUTPATIENT_CLINIC_OR_DEPARTMENT_OTHER)
Admission: RE | Admit: 2015-09-25 | Discharge: 2015-09-25 | Disposition: A | Discharge status: Home / Self Care | Payer: Managed Care, Other (non HMO) | Source: Ambulatory Visit | Attending: General Surgery | Admitting: General Surgery

## 2015-09-25 ENCOUNTER — Encounter (HOSPITAL_BASED_OUTPATIENT_CLINIC_OR_DEPARTMENT_OTHER)
Admission: RE | Disposition: A | Discharge status: Home / Self Care | Payer: Self-pay | Source: Ambulatory Visit | Attending: General Surgery

## 2015-09-25 ENCOUNTER — Ambulatory Visit (HOSPITAL_BASED_OUTPATIENT_CLINIC_OR_DEPARTMENT_OTHER): Payer: Managed Care, Other (non HMO) | Admitting: Anesthesiology

## 2015-09-25 ENCOUNTER — Ambulatory Visit
Admission: RE | Admit: 2015-09-25 | Discharge: 2015-09-25 | Disposition: A | Discharge status: Home / Self Care | Payer: Managed Care, Other (non HMO) | Source: Ambulatory Visit | Attending: General Surgery | Admitting: General Surgery

## 2015-09-25 ENCOUNTER — Encounter (HOSPITAL_BASED_OUTPATIENT_CLINIC_OR_DEPARTMENT_OTHER): Payer: Self-pay | Admitting: *Deleted

## 2015-09-25 DIAGNOSIS — N6092 Unspecified benign mammary dysplasia of left breast: Secondary | ICD-10-CM | POA: Diagnosis present

## 2015-09-25 DIAGNOSIS — E039 Hypothyroidism, unspecified: Secondary | ICD-10-CM | POA: Diagnosis not present

## 2015-09-25 DIAGNOSIS — I10 Essential (primary) hypertension: Secondary | ICD-10-CM | POA: Insufficient documentation

## 2015-09-25 DIAGNOSIS — J45909 Unspecified asthma, uncomplicated: Secondary | ICD-10-CM | POA: Insufficient documentation

## 2015-09-25 DIAGNOSIS — M1990 Unspecified osteoarthritis, unspecified site: Secondary | ICD-10-CM | POA: Insufficient documentation

## 2015-09-25 DIAGNOSIS — I4891 Unspecified atrial fibrillation: Secondary | ICD-10-CM | POA: Diagnosis not present

## 2015-09-25 HISTORY — DX: Hypothyroidism, unspecified: E03.9

## 2015-09-25 HISTORY — PX: RADIOACTIVE SEED GUIDED EXCISIONAL BREAST BIOPSY: SHX6490

## 2015-09-25 LAB — CBC WITH DIFFERENTIAL/PLATELET
BASOS ABS: 0.1 10*3/uL (ref 0.0–0.1)
Basophils Relative: 1 %
Eosinophils Absolute: 0.2 10*3/uL (ref 0.0–0.7)
Eosinophils Relative: 2 %
HEMATOCRIT: 36.8 % (ref 36.0–46.0)
HEMOGLOBIN: 11.2 g/dL — AB (ref 12.0–15.0)
LYMPHS PCT: 22 %
Lymphs Abs: 2.1 10*3/uL (ref 0.7–4.0)
MCH: 26.7 pg (ref 26.0–34.0)
MCHC: 30.4 g/dL (ref 30.0–36.0)
MCV: 87.6 fL (ref 78.0–100.0)
Monocytes Absolute: 0.9 10*3/uL (ref 0.1–1.0)
Monocytes Relative: 10 %
NEUTROS ABS: 6.2 10*3/uL (ref 1.7–7.7)
Neutrophils Relative %: 65 %
Platelets: 466 10*3/uL — ABNORMAL HIGH (ref 150–400)
RBC: 4.2 MIL/uL (ref 3.87–5.11)
RDW: 14.5 % (ref 11.5–15.5)
WBC: 9.6 10*3/uL (ref 4.0–10.5)

## 2015-09-25 LAB — POCT I-STAT, CHEM 8
BUN: 11 mg/dL (ref 6–20)
CHLORIDE: 104 mmol/L (ref 101–111)
CREATININE: 0.7 mg/dL (ref 0.44–1.00)
Calcium, Ion: 1.18 mmol/L (ref 1.12–1.23)
GLUCOSE: 97 mg/dL (ref 65–99)
HCT: 36 % (ref 36.0–46.0)
Hemoglobin: 12.2 g/dL (ref 12.0–15.0)
POTASSIUM: 3.5 mmol/L (ref 3.5–5.1)
Sodium: 141 mmol/L (ref 135–145)
TCO2: 25 mmol/L (ref 0–100)

## 2015-09-25 SURGERY — RADIOACTIVE SEED GUIDED BREAST BIOPSY
Anesthesia: General | Site: Breast | Laterality: Left

## 2015-09-25 MED ORDER — PROMETHAZINE HCL 25 MG/ML IJ SOLN: 6.2500 mg | INTRAMUSCULAR | Status: DC | PRN

## 2015-09-25 MED ORDER — FENTANYL CITRATE (PF) 100 MCG/2ML IJ SOLN
INTRAMUSCULAR | Status: AC
  Filled 2015-09-25: qty 2

## 2015-09-25 MED ORDER — BUPIVACAINE HCL (PF) 0.25 % IJ SOLN
INTRAMUSCULAR | Status: AC
  Filled 2015-09-25: qty 30

## 2015-09-25 MED ORDER — DEXAMETHASONE SODIUM PHOSPHATE 4 MG/ML IJ SOLN
INTRAMUSCULAR | Status: DC | PRN
  Administered 2015-09-25: 10 mg via INTRAVENOUS

## 2015-09-25 MED ORDER — HYDROCODONE-ACETAMINOPHEN 10-325 MG PO TABS: 1.0000 | ORAL_TABLET | Freq: Four times a day (QID) | ORAL | 0 refills | Status: DC | PRN

## 2015-09-25 MED ORDER — KETOROLAC TROMETHAMINE 30 MG/ML IJ SOLN
INTRAMUSCULAR | Status: AC
  Filled 2015-09-25: qty 1

## 2015-09-25 MED ORDER — PROPOFOL 500 MG/50ML IV EMUL
INTRAVENOUS | Status: AC
  Filled 2015-09-25: qty 50

## 2015-09-25 MED ORDER — ACETAMINOPHEN 10 MG/ML IV SOLN
INTRAVENOUS | Status: AC
  Filled 2015-09-25: qty 100

## 2015-09-25 MED ORDER — PROMETHAZINE HCL 12.5 MG PO TABS: 12.5000 mg | ORAL_TABLET | Freq: Four times a day (QID) | ORAL | 0 refills | Status: DC | PRN

## 2015-09-25 MED ORDER — FENTANYL CITRATE (PF) 100 MCG/2ML IJ SOLN
25.0000 ug | INTRAMUSCULAR | Status: DC | PRN
  Administered 2015-09-25: 25 ug via INTRAVENOUS
  Administered 2015-09-25: 50 ug via INTRAVENOUS
  Administered 2015-09-25: 25 ug via INTRAVENOUS

## 2015-09-25 MED ORDER — KETOROLAC TROMETHAMINE 30 MG/ML IJ SOLN
30.0000 mg | Freq: Once | INTRAMUSCULAR | Status: AC
  Administered 2015-09-25: 30 mg via INTRAVENOUS

## 2015-09-25 MED ORDER — PROPOFOL 10 MG/ML IV BOLUS
INTRAVENOUS | Status: DC | PRN
  Administered 2015-09-25: 200 mg via INTRAVENOUS

## 2015-09-25 MED ORDER — ONDANSETRON HCL 4 MG/2ML IJ SOLN
INTRAMUSCULAR | Status: AC
  Filled 2015-09-25: qty 2

## 2015-09-25 MED ORDER — SCOPOLAMINE 1 MG/3DAYS TD PT72: 1.0000 | MEDICATED_PATCH | Freq: Once | TRANSDERMAL | Status: DC | PRN

## 2015-09-25 MED ORDER — MIDAZOLAM HCL 2 MG/2ML IJ SOLN
INTRAMUSCULAR | Status: AC
  Filled 2015-09-25: qty 2

## 2015-09-25 MED ORDER — MIDAZOLAM HCL 2 MG/2ML IJ SOLN
1.0000 mg | INTRAMUSCULAR | Status: DC | PRN
  Administered 2015-09-25: 2 mg via INTRAVENOUS

## 2015-09-25 MED ORDER — HYDROMORPHONE HCL 1 MG/ML IJ SOLN: 0.2500 mg | INTRAMUSCULAR | Status: DC | PRN

## 2015-09-25 MED ORDER — CEFAZOLIN SODIUM-DEXTROSE 2-4 GM/100ML-% IV SOLN
2.0000 g | INTRAVENOUS | Status: AC
  Administered 2015-09-25: 2 g via INTRAVENOUS

## 2015-09-25 MED ORDER — CEFAZOLIN SODIUM-DEXTROSE 2-4 GM/100ML-% IV SOLN
INTRAVENOUS | Status: AC
  Filled 2015-09-25: qty 100

## 2015-09-25 MED ORDER — HYDROMORPHONE HCL 1 MG/ML IJ SOLN
INTRAMUSCULAR | Status: AC
  Filled 2015-09-25: qty 1

## 2015-09-25 MED ORDER — ACETAMINOPHEN 10 MG/ML IV SOLN
1000.0000 mg | Freq: Once | INTRAVENOUS | Status: AC
  Administered 2015-09-25: 1000 mg via INTRAVENOUS

## 2015-09-25 MED ORDER — LIDOCAINE 2% (20 MG/ML) 5 ML SYRINGE
INTRAMUSCULAR | Status: AC
  Filled 2015-09-25: qty 5

## 2015-09-25 MED ORDER — LIDOCAINE 2% (20 MG/ML) 5 ML SYRINGE
INTRAMUSCULAR | Status: DC | PRN
  Administered 2015-09-25: 100 mg via INTRAVENOUS

## 2015-09-25 MED ORDER — FENTANYL CITRATE (PF) 100 MCG/2ML IJ SOLN
50.0000 ug | INTRAMUSCULAR | Status: DC | PRN
  Administered 2015-09-25: 100 ug via INTRAVENOUS

## 2015-09-25 MED ORDER — LACTATED RINGERS IV SOLN
INTRAVENOUS | Status: DC
  Administered 2015-09-25: 07:00:00 via INTRAVENOUS

## 2015-09-25 MED ORDER — DEXAMETHASONE SODIUM PHOSPHATE 10 MG/ML IJ SOLN
INTRAMUSCULAR | Status: AC
  Filled 2015-09-25: qty 1

## 2015-09-25 MED ORDER — GLYCOPYRROLATE 0.2 MG/ML IJ SOLN: 0.2000 mg | Freq: Once | INTRAMUSCULAR | Status: DC | PRN

## 2015-09-25 SURGICAL SUPPLY — 56 items
BINDER BREAST LRG (GAUZE/BANDAGES/DRESSINGS) ×1 IMPLANT
BINDER BREAST MEDIUM (GAUZE/BANDAGES/DRESSINGS) IMPLANT
BINDER BREAST XLRG (GAUZE/BANDAGES/DRESSINGS) IMPLANT
BINDER BREAST XXLRG (GAUZE/BANDAGES/DRESSINGS) IMPLANT
BLADE SURG 15 STRL LF DISP TIS (BLADE) ×1 IMPLANT
BLADE SURG 15 STRL SS (BLADE) ×2
CANISTER SUC SOCK COL 7IN (MISCELLANEOUS) IMPLANT
CANISTER SUCT 1200ML W/VALVE (MISCELLANEOUS) IMPLANT
CHLORAPREP W/TINT 26ML (MISCELLANEOUS) ×2 IMPLANT
CLIP TI WIDE RED SMALL 6 (CLIP) ×1 IMPLANT
COVER BACK TABLE 60X90IN (DRAPES) ×2 IMPLANT
COVER MAYO STAND STRL (DRAPES) ×2 IMPLANT
COVER PROBE W GEL 5X96 (DRAPES) ×2 IMPLANT
DECANTER SPIKE VIAL GLASS SM (MISCELLANEOUS) IMPLANT
DEVICE DUBIN W/COMP PLATE 8390 (MISCELLANEOUS) ×2 IMPLANT
DRAPE LAPAROSCOPIC ABDOMINAL (DRAPES) ×2 IMPLANT
DRAPE UTILITY XL STRL (DRAPES) ×2 IMPLANT
DRSG TEGADERM 4X4.75 (GAUZE/BANDAGES/DRESSINGS) IMPLANT
ELECT COATED BLADE 2.86 ST (ELECTRODE) ×2 IMPLANT
ELECT REM PT RETURN 9FT ADLT (ELECTROSURGICAL) ×2
ELECTRODE REM PT RTRN 9FT ADLT (ELECTROSURGICAL) ×1 IMPLANT
GLOVE BIO SURGEON STRL SZ7 (GLOVE) ×4 IMPLANT
GLOVE BIOGEL PI IND STRL 7.0 (GLOVE) IMPLANT
GLOVE BIOGEL PI IND STRL 7.5 (GLOVE) ×1 IMPLANT
GLOVE BIOGEL PI INDICATOR 7.0 (GLOVE) ×2
GLOVE BIOGEL PI INDICATOR 7.5 (GLOVE) ×1
GLOVE ECLIPSE 6.5 STRL STRAW (GLOVE) ×1 IMPLANT
GOWN STRL REUS W/ TWL LRG LVL3 (GOWN DISPOSABLE) ×2 IMPLANT
GOWN STRL REUS W/TWL LRG LVL3 (GOWN DISPOSABLE) ×4
ILLUMINATOR WAVEGUIDE N/F (MISCELLANEOUS) IMPLANT
KIT MARKER MARGIN INK (KITS) ×2 IMPLANT
LIGHT WAVEGUIDE WIDE FLAT (MISCELLANEOUS) IMPLANT
LIQUID BAND (GAUZE/BANDAGES/DRESSINGS) ×2 IMPLANT
NDL HYPO 25X1 1.5 SAFETY (NEEDLE) ×1 IMPLANT
NEEDLE HYPO 25X1 1.5 SAFETY (NEEDLE) ×2 IMPLANT
NS IRRIG 1000ML POUR BTL (IV SOLUTION) IMPLANT
PACK BASIN DAY SURGERY FS (CUSTOM PROCEDURE TRAY) ×2 IMPLANT
PENCIL BUTTON HOLSTER BLD 10FT (ELECTRODE) ×2 IMPLANT
SHEET MEDIUM DRAPE 40X70 STRL (DRAPES) IMPLANT
SLEEVE SCD COMPRESS KNEE MED (MISCELLANEOUS) ×2 IMPLANT
SPONGE GAUZE 4X4 12PLY STER LF (GAUZE/BANDAGES/DRESSINGS) IMPLANT
SPONGE LAP 4X18 X RAY DECT (DISPOSABLE) ×2 IMPLANT
STRIP CLOSURE SKIN 1/2X4 (GAUZE/BANDAGES/DRESSINGS) ×2 IMPLANT
SUT MNCRL AB 4-0 PS2 18 (SUTURE) IMPLANT
SUT MON AB 5-0 PS2 18 (SUTURE) ×1 IMPLANT
SUT SILK 2 0 SH (SUTURE) IMPLANT
SUT VIC AB 2-0 SH 27 (SUTURE) ×2
SUT VIC AB 2-0 SH 27XBRD (SUTURE) ×1 IMPLANT
SUT VIC AB 3-0 SH 27 (SUTURE) ×2
SUT VIC AB 3-0 SH 27X BRD (SUTURE) ×1 IMPLANT
SUT VIC AB 5-0 PS2 18 (SUTURE) IMPLANT
SYR CONTROL 10ML LL (SYRINGE) ×2 IMPLANT
TOWEL OR 17X24 6PK STRL BLUE (TOWEL DISPOSABLE) ×2 IMPLANT
TOWEL OR NON WOVEN STRL DISP B (DISPOSABLE) ×2 IMPLANT
TUBE CONNECTING 20X1/4 (TUBING) IMPLANT
YANKAUER SUCT BULB TIP NO VENT (SUCTIONS) IMPLANT

## 2015-09-25 NOTE — Discharge Instructions (Signed)
[facility] °Office Phone Number [phone] ° °POST OP INSTRUCTIONS ° °Always review your discharge instruction sheet given to you by the facility where your surgery was performed. ° °IF YOU HAVE DISABILITY OR FAMILY LEAVE FORMS, YOU MUST BRING THEM TO THE OFFICE FOR PROCESSING.  DO NOT GIVE THEM TO YOUR DOCTOR. ° °1. A prescription for pain medication may be given to you upon discharge.  Take your pain medication as prescribed, if needed.  If narcotic pain medicine is not needed, then you may take acetaminophen (Tylenol), naprosyn (Alleve) or ibuprofen (Advil) as needed. °2. Take your usually prescribed medications unless otherwise directed °3. If you need a refill on your pain medication, please contact your pharmacy.  They will contact our office to request authorization.  Prescriptions will not be filled after 5pm or on week-ends. °4. You should eat very light the first 24 hours after surgery, such as soup, crackers, pudding, etc.  Resume your normal diet the day after surgery. °5. Most patients will experience some swelling and bruising in the breast.  Ice packs and a good support bra will help.  Wear the breast binder provided or a sports bra for 72 hours day and night.  After that wear a sports bra during the day until you return to the office. Swelling and bruising can take several days to resolve.  °6. It is common to experience some constipation if taking pain medication after surgery.  Increasing fluid intake and taking a stool softener will usually help or prevent this problem from occurring.  A mild laxative (Milk of Magnesia or Miralax) should be taken according to package directions if there are no bowel movements after 48 hours. °7. Unless discharge instructions indicate otherwise, you may remove your bandages 48 hours after surgery and you may shower at that time.  You may have steri-strips (small skin tapes) in place directly over the incision.  These strips should be left on the  skin for 7-10 days and will come off on their own.  If your surgeon used skin glue on the incision, you may shower in 24 hours.  The glue will flake off over the next 2-3 weeks.  Any sutures or staples will be removed at the office during your follow-up visit. °8. ACTIVITIES:  You may resume regular daily activities (gradually increasing) beginning the next day.  Wearing a good support bra or sports bra minimizes pain and swelling.  You may have sexual intercourse when it is comfortable. °a. You may drive when you no longer are taking prescription pain medication, you can comfortably wear a seatbelt, and you can safely maneuver your car and apply brakes. °b. RETURN TO WORK:  ______________________________________________________________________________________ °9. You should see your doctor in the office for a follow-up appointment approximately two weeks after your surgery.  Your doctor’s nurse will typically make your follow-up appointment when she calls you with your pathology report.  Expect your pathology report 3-4 business days after your surgery.  You may call to check if you do not hear from us after three days. °10. OTHER INSTRUCTIONS: _______________________________________________________________________________________________ _____________________________________________________________________________________________________________________________________ °_____________________________________________________________________________________________________________________________________ °_____________________________________________________________________________________________________________________________________ ° °WHEN TO CALL DR WAKEFIELD: °1. Fever over 101.0 °2. Nausea and/or vomiting. °3. Extreme swelling or bruising. °4. Continued bleeding from incision. °5. Increased pain, redness, or drainage from the incision. ° °The clinic staff is available to answer your questions during regular  business hours.  Please don’t hesitate to call and ask to speak to one of the nurses for clinical concerns.  If   you have a medical emergency, go to the nearest emergency room or call 911.  A surgeon from [facility] is always on call at the hospital. ° °For further questions, please visit [email or web address] ° ° ° °Post Anesthesia Home Care Instructions ° °Activity: °Get plenty of rest for the remainder of the day. A responsible adult should stay with you for 24 hours following the procedure.  °For the next 24 hours, DO NOT: °-Drive a car °-Operate machinery °-Drink alcoholic beverages °-Take any medication unless instructed by your physician °-Make any legal decisions or sign important papers. ° °Meals: °Start with liquid foods such as gelatin or soup. Progress to regular foods as tolerated. Avoid greasy, spicy, heavy foods. If nausea and/or vomiting occur, drink only clear liquids until the nausea and/or vomiting subsides. Call your physician if vomiting continues. ° °Special Instructions/Symptoms: °Your throat may feel dry or sore from the anesthesia or the breathing tube placed in your throat during surgery. If this causes discomfort, gargle with warm salt water. The discomfort should disappear within 24 hours. ° °If you had a scopolamine patch placed behind your ear for the management of post- operative nausea and/or vomiting: ° °1. The medication in the patch is effective for 72 hours, after which it should be removed.  Wrap patch in a tissue and discard in the trash. Wash hands thoroughly with soap and water. °2. You may remove the patch earlier than 72 hours if you experience unpleasant side effects which may include dry mouth, dizziness or visual disturbances. °3. Avoid touching the patch. Wash your hands with soap and water after contact with the patch. °  ° °

## 2015-09-25 NOTE — Interval H&P Note (Signed)
History and Physical Interval Note:  09/25/2015 9:06 AM  Robin [name]  has presented today for surgery, with the diagnosis of LEFT BREAST MASS  The various methods of treatment have been discussed with the patient and family. After consideration of risks, benefits and other options for treatment, the patient has consented to  Procedure(s): RADIOACTIVE SEED GUIDED EXCISIONAL BREAST BIOPSY (Left) as a surgical intervention .  The patient's history has been reviewed, patient examined, no change in status, stable for surgery.  I have reviewed the patient's chart and labs.  Questions were answered to the patient's satisfaction.     Niccolas Loeper

## 2015-09-25 NOTE — H&P (Signed)
Robin [name] is an 60 y.o. female.   Chief Complaint: breast mass HPI: 70 yof who presents after noting some focal tenderness in her left breast. she has history of a left breast core biopsy. Has fh in paternal gm of breast cancer. her density is C. she has a subtle area of distortion in the left breast at 1 oclock. US shows no definite correlate. [place] of axilla is negative. she underwent tomo biopsy of this area. the clip is displaced about 3.3 cm from the biopsy site. pathology shows [place]. she reports no other real complaints referable to either breast. she is here with her mom today   Past Medical History:  Diagnosis Date  . Arthritis   . Asthma   . Atrial fibrillation ([place])    no problems since initial episode  . Cancer ([place])    skin  . Crohn's disease ([place])   . Hypertension   . Hypothyroidism   . IUD (intrauterine device) in place 1983   Safety coil per Dr. Valeta Harms note attempted to retrieve but unable to do so  . Osteoporosis 12/2014    T score -2.6 stable from prior DEXA on Prolia  . Thyroid disease    Hypothyroid    Past Surgical History:  Procedure Laterality Date  . Basal and squamous cell Ca excised    . COLONOSCOPY N/A 07/01/2013   Procedure: COLONOSCOPY;  Surgeon: Garlan Fair, MD;  Location: [facility];  Service: Endoscopy;  Laterality: N/A;  . Fissure surg      Family History  Problem Relation Age of Onset  . Colon cancer Mother   . Heart disease Mother     Atrial fibrillation  . Heart disease Maternal Grandmother   . Hypertension Maternal Grandmother   . Breast cancer Paternal Grandmother     age unknown   Social History:  reports that she has never smoked. She has never used smokeless tobacco. She reports that she drinks about 1.2 oz of alcohol per week . She reports that she does not use drugs.  Allergies:  Allergies  Allergen Reactions  . Codeine Nausea Only    Needs something for nausea to go with pain pill  . Tetracyclines &  Related Diarrhea  . Biaxin [Clarithromycin] Palpitations  . Clarithromycin Palpitations    Medications Prior to Admission  Medication Sig Dispense Refill  . Biotin 5 MG CAPS Take 5 mg by mouth daily.     Marland Kitchen ibuprofen (ADVIL,MOTRIN) 600 MG tablet Take 1 tablet (600 mg total) by mouth every 6 (six) hours as needed. 30 tablet 0  . SYNTHROID 75 MCG tablet Take 1 tablet (75 mcg total) by mouth daily before breakfast. 90 tablet 3  . verapamil (CALAN-SR) 180 MG CR tablet Take 1 tablet (180 mg total) by mouth daily. 90 tablet 2  . denosumab (PROLIA) 60 MG/ML SOLN injection Inject 60 mg into the skin every 6 (six) months. Administer in upper arm, thigh, or abdomen      Results for orders placed or performed during the hospital encounter of 09/25/15 (from the past 48 hour(s))  CBC WITH DIFFERENTIAL     Status: Abnormal   Collection Time: 09/25/15  7:00 AM  Result Value Ref Range   WBC 9.6 4.0 - 10.5 K/uL   RBC 4.20 3.87 - 5.11 MIL/uL   Hemoglobin 11.2 (L) 12.0 - 15.0 g/dL   HCT 36.8 36.0 - 46.0 %   MCV 87.6 78.0 - 100.0 fL   MCH 26.7 26.0 -  34.0 pg   MCHC 30.4 30.0 - 36.0 g/dL   RDW 14.5 11.5 - 15.5 %   Platelets 466 (H) 150 - 400 K/uL   Neutrophils Relative % 65 %   Neutro Abs 6.2 1.7 - 7.7 K/uL   Lymphocytes Relative 22 %   Lymphs Abs 2.1 0.7 - 4.0 K/uL   Monocytes Relative 10 %   Monocytes Absolute 0.9 0.1 - 1.0 K/uL   Eosinophils Relative 2 %   Eosinophils Absolute 0.2 0.0 - 0.7 K/uL   Basophils Relative 1 %   Basophils Absolute 0.1 0.0 - 0.1 K/uL  I-STAT, chem 8     Status: None   Collection Time: 09/25/15  7:03 AM  Result Value Ref Range   Sodium 141 135 - 145 mmol/L   Potassium 3.5 3.5 - 5.1 mmol/L   Chloride 104 101 - 111 mmol/L   BUN 11 6 - 20 mg/dL   Creatinine, Ser 0.70 0.44 - 1.00 mg/dL   Glucose, Bld 97 65 - 99 mg/dL   Calcium, Ion 1.18 1.12 - 1.23 mmol/L   TCO2 25 0 - 100 mmol/L   Hemoglobin 12.2 12.0 - 15.0 g/dL   HCT 36.0 36.0 - 46.0 %   No results  found.  ROS negative Pulse 88, temperature 98.3 F (36.8 C), temperature source Oral, resp. rate 20, height 5' 5"  (1.651 m), weight 71.7 kg (158 lb), SpO2 99 %. Physical Exam   Vitals  Weight: 155 lb Height: 65.5in Body Surface Area: 1.78 m Body Mass Index: 25.4 kg/m  Temp.: 98.13F  Pulse: 51 (Regular)  BP: 126/72 (Sitting, Left Arm, Standard) Physical Exam General Mental Status-Alert. Orientation-Oriented X3. Chest and Lung Exam Chest and lung exam reveals -on auscultation, normal breath sounds, no adventitious sounds and normal vocal resonance. Breast Nipples-No Discharge. Breast Lump-No Palpable Breast Mass. Cardiovascular Cardiovascular examination reveals -normal heart sounds, regular rate and rhythm with no murmurs. Lymphatic Head & Neck General Head & Neck Lymphatics: Bilateral - Description - Normal. Axillary General Axillary Region: Bilateral - Description - Normal. Note: no Coffeen adenopathy  Assessment/Plan Assessment & Plan  ATYPICAL LOBULAR HYPERPLASIA (ALH) OF LEFT BREAST (N60.92) Story: left breast seed guided excisional biopsy discussed indications for excision given possible associated of other pathology with alh. observation is option.she would like to undergo excision. will plan for seed guided excisional biopsy. risks discussed.  Rolm Bookbinder, MD 09/25/2015, 9:05 AM

## 2015-09-25 NOTE — Op Note (Signed)
Preoperative diagnoses: left breast mammographic distortion with core biopsy showing ALH Postoperative diagnosis: Same as above Procedure:Left breast seed guided excisional biopsy Surgeon: Dr. Serita Grammes Anesthesia: Gen. Estimated blood loss: Minimal Complications: None Drains: None Specimens:left breast tissue marked with paint Sponge and needle count correct at completion Disposition to recovery stable  Indications: This is a 48 yof who has a left sided mm distortion with biopsy showing ALH. .  We discussed seed guided excision of ALH.  She had seed placed prior to surgery.  I had her mm in the OR.   Procedure:After informed consent was obtained she was then taken to the operating room. She was given cefazolin. Sequential compression devices on her legs. She was placed under general anesthesia without complication. Her left breast was then prepped and draped in the standard sterile surgical fashion. A surgical timeout was then performed.  I located the radioactive seed with the neoprobe.I infiltrated marcaine in the periareolar area and then made a periareolar incision. I then used the neoprobe to guide the excision of the seed and surrounding tissue. . This was confirmed by the neoprobe. This was painted. This was then taken for mammogram which confirmed removal of the clip and the seed. This was confirmed by radiology. This was then sent to pathology. Hemostasis was observed. I closed the breast tissue with a 2-0 Vicryl. The dermis was closed with 3-0 Vicryl and the skin with 5-0 Monocryl. Dermabond and steristrips were placed on the incision. She was transferred to recovery stable

## 2015-09-25 NOTE — Transfer of Care (Signed)
Immediate Anesthesia Transfer of Care Note  Patient: Robin [name]  Procedure(s) Performed: Procedure(s): RADIOACTIVE SEED GUIDED EXCISIONAL BREAST BIOPSY (Left)  Patient Location: PACU  Anesthesia Type:General  Level of Consciousness: awake and sedated  Airway & Oxygen Therapy: Patient Spontanous Breathing  Post-op Assessment: Report given to RN and Post -op Vital signs reviewed and stable  Post vital signs: Reviewed and stable  Last Vitals:  Vitals:   09/25/15 1001 09/25/15 1002  BP:  109/72  Pulse: 73 72  Resp:  14  Temp:      Last Pain:  Vitals:   09/25/15 0635  TempSrc: Oral         Complications: No apparent anesthesia complications

## 2015-09-25 NOTE — Anesthesia Preprocedure Evaluation (Signed)
Anesthesia Evaluation  Patient identified by MRN, date of birth, ID band Patient awake    Reviewed: Allergy & Precautions, H&P , NPO status , Patient's Chart, lab work & pertinent test results  History of Anesthesia Complications Negative for: history of anesthetic complications  Airway Mallampati: II  TM Distance: >3 FB Neck ROM: full    Dental no notable dental hx.    Pulmonary asthma ,    Pulmonary exam normal breath sounds clear to auscultation       Cardiovascular hypertension, Normal cardiovascular exam+ dysrhythmias Atrial Fibrillation  Rhythm:regular Rate:Normal     Neuro/Psych negative neurological ROS     GI/Hepatic negative GI ROS, Neg liver ROS,   Endo/Other  Hypothyroidism   Renal/GU negative Renal ROS     Musculoskeletal  (+) Arthritis ,   Abdominal   Peds  Hematology negative hematology ROS (+)   Anesthesia Other Findings   Reproductive/Obstetrics negative OB ROS                             Anesthesia Physical Anesthesia Plan  ASA: III  Anesthesia Plan: General   Post-op Pain Management:    Induction: Intravenous  Airway Management Planned: LMA  Additional Equipment:   Intra-op Plan:   Post-operative Plan: Extubation in OR  Informed Consent: I have reviewed the patients History and Physical, chart, labs and discussed the procedure including the risks, benefits and alternatives for the proposed anesthesia with the patient or authorized representative who has indicated his/her understanding and acceptance.   Dental Advisory Given  Plan Discussed with: Anesthesiologist, CRNA and Surgeon  Anesthesia Plan Comments:         Anesthesia Quick Evaluation

## 2015-09-25 NOTE — Anesthesia Postprocedure Evaluation (Signed)
Anesthesia Post Note  Patient: Robin [name]  Procedure(s) Performed: Procedure(s) (LRB): RADIOACTIVE SEED GUIDED EXCISIONAL BREAST BIOPSY (Left)  Patient location during evaluation: PACU Anesthesia Type: General Level of consciousness: awake and alert Pain management: pain level controlled Vital Signs Assessment: post-procedure vital signs reviewed and stable Respiratory status: spontaneous breathing, nonlabored ventilation, respiratory function stable and patient connected to nasal cannula oxygen Cardiovascular status: blood pressure returned to baseline and stable Postop Assessment: no signs of nausea or vomiting Anesthetic complications: no    Last Vitals:  Vitals:   09/25/15 1020 09/25/15 1030  BP:  133/86  Pulse: 76 79  Resp: 11 10  Temp:      Last Pain:  Vitals:   09/25/15 1030  TempSrc:   PainSc: 6                  Zenaida Deed

## 2015-09-25 NOTE — Anesthesia Procedure Notes (Signed)
Procedure Name: LMA Insertion Performed by: Nike Southwell W Pre-anesthesia Checklist: Patient identified, Emergency Drugs available, Suction available and Patient being monitored Patient Re-evaluated:Patient Re-evaluated prior to inductionOxygen Delivery Method: Circle system utilized Preoxygenation: Pre-oxygenation with 100% oxygen Intubation Type: IV induction Ventilation: Mask ventilation without difficulty LMA: LMA inserted LMA Size: 4.0 Number of attempts: 1 Placement Confirmation: positive ETCO2 Tube secured with: Tape Dental Injury: Teeth and Oropharynx as per pre-operative assessment        

## 2015-09-27 ENCOUNTER — Encounter (HOSPITAL_BASED_OUTPATIENT_CLINIC_OR_DEPARTMENT_OTHER): Payer: Self-pay | Admitting: General Surgery

## 2015-10-17 ENCOUNTER — Telehealth: Payer: Self-pay | Admitting: Cardiology

## 2015-10-17 MED ORDER — VERAPAMIL HCL ER 180 MG PO TBCR: 180.0000 mg | EXTENDED_RELEASE_TABLET | Freq: Every day | ORAL | 12 refills | Status: DC

## 2015-10-17 NOTE — Telephone Encounter (Signed)
New Message  Pt c/o medication issue:  1. Name of Medication: Verapamil (Calan-SR)  2. How are you currently taking this medication (dosage and times per day)? 180 mg  3. Are you having a reaction (difficulty breathing--STAT)? Pt states Sore throat; early morning; tingling in tongue and throughout face.  4. What is your medication issue? Pt call requesting to speak with RN. Pt states she is having a reaction to the generic brand of the med and would like further instructions please call back to discuss

## 2015-10-17 NOTE — Telephone Encounter (Signed)
[facility], they do not use [facility] for their verapamil. New script sent to the pharmacy left message for the pt to call if problems once she switches to a different generic.

## 2015-10-17 NOTE — Telephone Encounter (Signed)
Spoke with pt, for about one month now she has had sore throat, tingling in the throat and up into face. It comes and goes and does not occur everyday. She has taken antibiotics and yeast medication that has not changed any of her symptoms. The only change was a change to generic verapamil. She had gotten her current supply from mail order and [facility] and [facility] on the [facility] of their verapamil.

## 2015-10-24 ENCOUNTER — Encounter: Payer: Self-pay | Admitting: Oncology

## 2015-10-24 ENCOUNTER — Telehealth: Payer: Self-pay | Admitting: Oncology

## 2015-10-24 NOTE — Telephone Encounter (Signed)
Returned the patient's phone call. Patient scheduled with GM on 9/7 at 430pm. Aware to arrive 30 minutes early. Demographics verified. Letter to the referring.

## 2015-10-26 ENCOUNTER — Telehealth: Payer: Self-pay | Admitting: Gynecology

## 2015-10-26 ENCOUNTER — Telehealth: Payer: Self-pay | Admitting: *Deleted

## 2015-10-26 NOTE — Telephone Encounter (Signed)
Received appt date/time from [facility].  Mailed new pt packet to pt.

## 2015-10-31 ENCOUNTER — Telehealth: Payer: Self-pay | Admitting: Gynecology

## 2015-10-31 NOTE — Telephone Encounter (Signed)
Insurance terminated

## 2015-10-31 NOTE — Telephone Encounter (Addendum)
PC to pt , insurance information not valid , Terminated on 09/25/15, LM to call with new card information.  Prolia due after 11/12/15

## 2015-11-02 ENCOUNTER — Other Ambulatory Visit: Payer: Managed Care, Other (non HMO)

## 2015-11-02 ENCOUNTER — Other Ambulatory Visit: Payer: Self-pay

## 2015-11-02 ENCOUNTER — Ambulatory Visit (HOSPITAL_BASED_OUTPATIENT_CLINIC_OR_DEPARTMENT_OTHER): Payer: Managed Care, Other (non HMO) | Admitting: Oncology

## 2015-11-02 VITALS — BP 147/82 | HR 92 | Temp 99.0°F | Resp 18 | Ht 65.0 in | Wt 161.9 lb

## 2015-11-02 DIAGNOSIS — C50912 Malignant neoplasm of unspecified site of left female breast: Secondary | ICD-10-CM

## 2015-11-02 DIAGNOSIS — N62 Hypertrophy of breast: Secondary | ICD-10-CM

## 2015-11-02 DIAGNOSIS — N6092 Unspecified benign mammary dysplasia of left breast: Secondary | ICD-10-CM

## 2015-11-02 NOTE — Progress Notes (Signed)
[facility]  [phone] [phone]     ID: Robin [name] DOB: [birth year]  MR#: [id number]  XAJ#:[id number]  Patient Care Team: Josetta Huddle, MD as PCP - General (Internal Medicine) Rolm Bookbinder, MD as Consulting Physician (General Surgery) Chauncey Cruel, MD as Consulting Physician (Oncology) Anastasio Auerbach, MD as Consulting Physician (Gynecology) Harriett Sine, MD as Consulting Physician (Dermatology) Lelon Perla, MD as Consulting Physician (Cardiology) Garlan Fair, MD as Consulting Physician (Gastroenterology) OTHER MD:  CHIEF COMPLAINT: atypical lobular hyperplasia  CURRENT TREATMENT:  observation   HISTORY  OF CURRENT ILLNESS:  Robin [name] herself noted a change in her left breast and brought it to Dr. Christia Reading Fontaine's attention. Hhe set her up for diagnostic left breast mammogram with tomography and left breast ultrasonography 07/17/2015. This found the breast density to be category C.there was a subtle area of distortion in the left breast upper outer quadrant which was not palpable on examination. Ultrasonography found no definite correlate. Tomosynthesis guided biopsy was performed 07/27/2015, and showed ([id number]) atypical lobular hyperplasia.  The patient was evaluated by surgery and after appropriate discussion excisional biopsy was performed 09/25/2015. This showed ([id number]) a complex sclerosing lesion with the usual ductal hyperplasia but also again atypical lobular hyperplasia.  The patient's subsequent history is as detailed below  INTERVAL HISTORY: Robin [name] was evaluated in the breast clinic 09/01/2015 accompanied by her mother.  REVIEW OF SYSTEMS: Aside from the subjective change in the left breast the patient noticed, there were no specific symptoms leading to the original mammogram, which was routinely scheduled. The patient denies unusual headaches, visual changes, nausea, vomiting, stiff neck,  dizziness, or gait imbalance. There has been no cough, phlegm production, or pleurisy, no chest pain or pressure, and no change in bowel or bladder habits. The patient denies fever, rash, bleeding, unexplained fatigue or unexplained weight loss. A detailed review of systems was otherwise entirely negative.   PAST MEDICAL HISTORY: Past Medical History:  Diagnosis Date  . Arthritis   . Asthma   . Atrial fibrillation ([place])    no problems since initial episode  . Cancer ([place])    skin  . Crohn's disease ([place])   . Hypertension   . Hypothyroidism   . IUD (intrauterine device) in place 1983   Safety coil per Dr. Valeta Harms note attempted to retrieve but unable to do so  . Osteoporosis 12/2014    T score -2.6 stable from prior DEXA on Prolia  . Thyroid disease    Hypothyroid    PAST SURGICAL HISTORY: Past Surgical History:  Procedure Laterality Date  . Basal and squamous cell Ca excised    . COLONOSCOPY N/A 07/01/2013   Procedure: COLONOSCOPY;  Surgeon: Garlan Fair, MD;  Location: [facility];  Service: Endoscopy;  Laterality: N/A;  . Fissure surg    . RADIOACTIVE SEED GUIDED EXCISIONAL BREAST BIOPSY Left 09/25/2015   Procedure: RADIOACTIVE SEED GUIDED EXCISIONAL BREAST BIOPSY;  Surgeon: Rolm Bookbinder, MD;  Location: [facility];  Service: General;  Laterality: Left;    FAMILY HISTORY Family History  Problem Relation Age of Onset  . Colon cancer Mother   . Heart disease Mother     Atrial fibrillation  . Heart disease Maternal Grandmother   . Hypertension Maternal Grandmother   . Breast cancer Paternal Grandmother     age unknown  the patient has no information regarding her father. Her mother is 43 years old and has a history of  colon polyps. There are no siblings. There is no history of cancer in the family.  GYNECOLOGIC HISTORY:  No LMP recorded. Patient is postmenopausal. Robin [name] does not recall when she had her first period. She is GX P0. 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 Her husband Wille Glaser is in the roofing business.at home is just the 2 of them plus the patient's mother and 1 cat    ADVANCED DIRECTIVES: in place   HEALTH MAINTENANCE: Social History  Substance Use Topics  . Smoking status: Never Smoker  . Smokeless tobacco: Never Used  . Alcohol use 1.2 oz/week    2 Standard drinks or equivalent per week     Comment: Occasional     Colonoscopy: UTD/ [facility]  PAP: 2016/ Fontaine  Bone density: 2016/ "stable"   Allergies  Allergen Reactions  . Codeine Nausea Only    Needs something for nausea to go with pain pill  . Tetracyclines & Related Diarrhea  . Biaxin [Clarithromycin] Palpitations  . Clarithromycin Palpitations    Current Outpatient Prescriptions  Medication Sig Dispense Refill  . Biotin 5 MG CAPS Take 5 mg by mouth daily.     Marland Kitchen denosumab (PROLIA) 60 MG/ML SOLN injection Inject 60 mg into the skin every 6 (six) months. Administer in upper arm, thigh, or abdomen    . ibuprofen (ADVIL,MOTRIN) 600 MG tablet Take 1 tablet (600 mg total) by mouth every 6 (six) hours as needed. 30 tablet 0  . SYNTHROID 75 MCG tablet Take 1 tablet (75 mcg total) by mouth daily before breakfast. 90 tablet 3  . verapamil (CALAN-SR) 180 MG CR tablet Take 1 tablet (180 mg total) by mouth daily. 30 tablet 12   No current facility-administered medications for this visit.     OBJECTIVE: middle-aged white woman in no acute distress Vitals:   11/02/15 1649  BP: (!) 147/82  Pulse: 92  Resp: 18  Temp: 99 F (37.2 C)     Body mass index is 26.94 kg/m.    ECOG FS:0 - Asymptomatic  Ocular: Sclerae unicteric, pupils equal, round and reactive to light Ear-nose-throat: Oropharynx clear and moist Lymphatic: No cervical or supraclavicular adenopathy Lungs no rales or rhonchi, good excursion bilaterally Heart regular rate and rhythm,  no murmur appreciated Abd soft, nontender, positive bowel sounds MSK no focal spinal tenderness, no joint edema Neuro: non-focal, well-oriented, appropriate affect Breasts: the right breast is unremarkable. The left breast is status post recent excisional biopsy. The incision is healing very nicely. There is no erythema, swelling or dehiscence. The cosmetic result is excellent. There is no suspicious finding in either breast. Both axillae are benign. Skin: Severe actinic change throughout including the skin of both breasts  LAB RESULTS:  CMP     Component Value Date/Time   NA 141 09/25/2015 0703   K 3.5 09/25/2015 0703   CL 104 09/25/2015 0703   CO2 26 08/16/2015 0011   GLUCOSE 97 09/25/2015 0703   BUN 11 09/25/2015 0703   CREATININE 0.70 09/25/2015 0703   CALCIUM 9.0 08/16/2015 0011   PROT 7.1 03/15/2014 1131   ALBUMIN 3.7 03/15/2014 1131   AST 20 03/15/2014 1131   ALT 19 03/15/2014 1131   ALKPHOS 48 03/15/2014 1131   BILITOT 0.5 03/15/2014 1131   GFRNONAA >60 08/16/2015 0011   GFRAA >60 08/16/2015 0011    INo results found for: SPEP, UPEP  Lab Results  Component Value Date   WBC 9.6 09/25/2015   NEUTROABS 6.2 09/25/2015   HGB 12.2 09/25/2015   HCT 36.0 09/25/2015   MCV 87.6 09/25/2015   PLT 466 (H) 09/25/2015      Chemistry      Component Value Date/Time   NA 141 09/25/2015 0703   K 3.5 09/25/2015 0703   CL 104 09/25/2015 0703   CO2 26 08/16/2015 0011   BUN 11 09/25/2015 0703   CREATININE 0.70 09/25/2015 0703      Component Value Date/Time   CALCIUM 9.0 08/16/2015 0011   ALKPHOS 48 03/15/2014 1131   AST 20 03/15/2014 1131   ALT 19 03/15/2014 1131   BILITOT 0.5 03/15/2014 1131       No results found for: LABCA2  No components found for: LABCA125  No results for input(s): INR in the last 168 hours.  Urinalysis    Component Value Date/Time   COLORURINE YELLOW 08/16/2015 0006   APPEARANCEUR CLEAR 08/16/2015 0006   LABSPEC 1.022 08/16/2015 0006    PHURINE 5.5 08/16/2015 0006   GLUCOSEU NEGATIVE 08/16/2015 0006   HGBUR LARGE (A) 08/16/2015 0006   BILIRUBINUR NEGATIVE 08/16/2015 0006   KETONESUR NEGATIVE 08/16/2015 0006   PROTEINUR NEGATIVE 08/16/2015 0006   UROBILINOGEN 0.2 12/16/2013 0854   NITRITE NEGATIVE 08/16/2015 0006   LEUKOCYTESUR MODERATE (A) 08/16/2015 0006     STUDIES: Outside studies reviewed ELIGIBLE FOR AVAILABLE RESEARCH PROTOCOL: no   ASSESSMENT: 60 y.o. [place], [place] woman Status post left breast upper outer quadrant biopsy 07/27/2015 showing atypical lobular hyperplasia  (1) left lumpectomy 09/25/2015 showed a complex sclerosing lesion and atypical lobular hyperplasia.  PLAN: We spent the better part of today's 50-minute appointment discussing the biology of breast cancer in general, and the specifics of atypical Lobular hyperplasia [ALH] more specifically. Nidya understands ALH means, first, relatively unrestricted growth of breast cells and, in addition, some morphologically different features from simple hyperplasia. This is not a cancer but a marker of breast cancer risk.   The risk of developing breast cancer in patients with ALH falls somewhere between one half and 1% per year. The new cancer can develop in either breast. It can be lobular or ductal.One half of those tumors would be invasive. In her case in addition we have the fact that she did not carry a child to term before the age of 61 (or ever) and that she has relatively dense breasts. All that further increases the breast cancer risk.  Working from the patient's age to the average survival of women today in the [place] I think Rendy can expect to live an additional 25years. Using the high end of the risk spectrum for purposes of discussion, this would give her a 20% or morechance of developing breast cancer in her lifetime. This is approximately 3 times the normal risk.  Yazmen has essentially two ways of lowering that risk. One of them is bilateral  mastectomies. This is not recommended for this indication and I do not believe it would be covered by insurance even if she wanted it which she doesn't. The other, more reasonable approach would be to take anti-estrogens. This could be tamoxifen, raloxifene/Evista, or an aromatase inhibitor  We then discussed the possible toxicities, side effects and complications of the tamoxifen group as opposed to the aromatase inhibitors. All information was given to Joesphine in Estate agent. Any of those agents if taken for 5 years would essentially cut the risk of developing a new breast cancer in half.  We also discussed intensified screening. This would be adding yearly MRI to yearly mammography with tomography. This approach greatly increases sensitivity. Any breast cancer that may develop should be found at the earliest possible stage. However intensified screening has not been documented to improve survival in this population. There are issues regarding cost even if insurance coverage is obtained. There also concerns regarding false positives especially in the first or second year of MRI screening.  Makenzy has a good understanding of all these options. At a minimum she understands she needs to have yearly mammography with tomography and biannual physician breast exams.   As for the additional options just discussed, at this point she was very clear that she has little interest in pursuing them. If she laterdecides to try an anti-estrogen I will be glad to prescribe it for her, and I would see her after a few months to make sure she is tolerating it well.   Otherwise the plan will be for her to have mammography in October with her annual visit with Dr. Phineas Real to follow that same month. She would then see me next April for review of systems and breast exam.  Finally I strongly urged her to change her diet and start an exercise program that would take her at least 45 minutes of exercise at least 5 days a week. This is  not breast cancer specific that it may reduce the risk of cancer developing in general by perhaps 2 or 3%. She already has excellent dermatologic screening which to me would be the major concern from a cancer point of view in her situation  Miko has a good understanding of the overall plan. She agrees with it. She knows the goal of treatment in her case is prevention She will call with any problems that may develop before her next visit here.  Chauncey Cruel, MD   11/02/2015 8:36 PM Medical Oncology and Hematology [facility] [address] [place], [place] [address] Tel. [phone]    Fax. [phone]

## 2015-11-02 NOTE — Telephone Encounter (Signed)
PC message from pt she has insurance with same provider had COBRA one month and now has same policy , no change with policy will have Prolia recheck benefits.

## 2015-11-14 NOTE — Telephone Encounter (Signed)
Prolia PA needed approval  Ref number [id number]  11/07/15 thru 11/06/2016

## 2015-11-15 NOTE — Telephone Encounter (Signed)
LM again on VM , Sharrie Rothman will confirm Ref# from [place], [place] also called [employer] and same PA number [id number] was given to Kelcie. She is scheduled for 11/20/15  Calcium 1.18 09/25/15

## 2015-11-15 NOTE — Telephone Encounter (Signed)
Krystan LM in vm for [place] to put her on the schedule for Monday sept 25 @ 9:00 for proila inj.    Is she good to proceed?   I called Genene left VM and told her that we needed to confirm PA due to Ref # was incorrect and information given was for pharmacy benefits. Will call [employer] and confirm then have pt reschedule.

## 2015-11-16 NOTE — Telephone Encounter (Signed)
Patient lost insurance in august and never called with new insurance information, Injection due after 11/12/15. She re instated her [employer] and then had to have a PA , she is losing this insurance again on 11/23/15, She has her injection scheduled for Monday 11/20/15, her last calcium level was 07/Results for Robin [name], Robin [name] (MRN [id number]) as of 11/15/2015 17:15   09/25/2015 07:03  Calcium Ionized: 1.18 Do you need a new calcium level prior to injection??  She should be good   Note from Dr Phineas Real, Appointment made and pt called.

## 2015-11-16 NOTE — Telephone Encounter (Addendum)
PC confirmed reference number by Sharrie Rothman automated system [employer] ref# [id number]  Pa confirmed appointment 11/20/15  9am Calcium OK by TF

## 2015-11-17 ENCOUNTER — Encounter: Payer: Self-pay | Admitting: Gynecology

## 2015-11-20 ENCOUNTER — Ambulatory Visit: Payer: Managed Care, Other (non HMO)

## 2015-11-20 ENCOUNTER — Ambulatory Visit (INDEPENDENT_AMBULATORY_CARE_PROVIDER_SITE_OTHER): Payer: Managed Care, Other (non HMO) | Admitting: Gynecology

## 2015-11-20 DIAGNOSIS — M810 Age-related osteoporosis without current pathological fracture: Secondary | ICD-10-CM | POA: Diagnosis not present

## 2015-11-20 MED ORDER — DENOSUMAB 60 MG/ML ~~LOC~~ SOLN
60.0000 mg | Freq: Once | SUBCUTANEOUS | Status: AC
  Administered 2015-11-20: 60 mg via SUBCUTANEOUS

## 2015-11-21 NOTE — Telephone Encounter (Signed)
Next Prolia due after 05/20/16

## 2015-11-28 ENCOUNTER — Other Ambulatory Visit: Payer: Self-pay | Admitting: Orthopedic Surgery

## 2015-12-25 ENCOUNTER — Telehealth: Payer: Self-pay | Admitting: *Deleted

## 2015-12-25 ENCOUNTER — Ambulatory Visit (INDEPENDENT_AMBULATORY_CARE_PROVIDER_SITE_OTHER): Payer: BLUE CROSS/BLUE SHIELD | Admitting: Gynecology

## 2015-12-25 ENCOUNTER — Encounter: Payer: Self-pay | Admitting: Gynecology

## 2015-12-25 VITALS — BP 124/84 | Ht 66.0 in | Wt 162.0 lb

## 2015-12-25 DIAGNOSIS — M810 Age-related osteoporosis without current pathological fracture: Secondary | ICD-10-CM

## 2015-12-25 DIAGNOSIS — N952 Postmenopausal atrophic vaginitis: Secondary | ICD-10-CM

## 2015-12-25 DIAGNOSIS — Z01419 Encounter for gynecological examination (general) (routine) without abnormal findings: Secondary | ICD-10-CM

## 2015-12-25 NOTE — Telephone Encounter (Signed)
-----   Message from Anastasio Auerbach, MD sent at 12/25/2015  9:58 AM EDT ----- Tell patient I reviewed her chart from Dr. Jana Hakim. It looks like she is due for bilateral mammography's now so she should go ahead and arrange for this. She also needs a follow up appointment with Dr. Jana Hakim this coming spring per his note.

## 2015-12-25 NOTE — Telephone Encounter (Signed)
Pt informed with the below note. 

## 2015-12-25 NOTE — Patient Instructions (Signed)

## 2015-12-25 NOTE — Progress Notes (Signed)
    DENAI CABA [birth year] [id number]        60 y.o.  G0P0  for annual exam.  Doing well from a gynecologic standpoint.  Past medical history,surgical history, problem list, medications, allergies, family history and social history were all reviewed and documented as reviewed in the EPIC chart.  ROS:  Performed with pertinent positives and negatives included in the history, assessment and plan.   Additional significant findings :  None   Exam: [name] assistant Vitals:   12/25/15 0856  BP: 124/84  Weight: 162 lb (73.5 kg)  Height: 5' 6"  (1.676 m)   Body mass index is 26.15 kg/m.  General appearance:  Normal affect, orientation and appearance. Skin: Grossly normal HEENT: Without gross lesions.  No cervical or supraclavicular adenopathy. Thyroid normal.  Lungs:  Clear without wheezing, rales or rhonchi Cardiac: RR, without RMG Abdominal:  Soft, nontender, without masses, guarding, rebound, organomegaly or hernia Breasts:  Examined lying and sitting without masses, retractions, discharge or axillary adenopathy. Pelvic:  Ext, BUS, Vagina with atrophic changes  Cervix with atrophic changes  Uterus anteverted, normal size, shape and contour, midline and mobile nontender   Adnexa without masses or tenderness    Anus and perineum normal   Rectovaginal normal sphincter tone without palpated masses or tenderness.    Assessment/Plan:  60 y.o. G0P0 female for annual exam.   1. Postmenopausal/atrophic genital changes. No significant hot flushes, night sweats, vaginal dryness or any vaginal bleeding. Continue to monitor report any issues or bleeding. 2. Osteoporosis. DEXA 12/2014 T score -2.6 stable from prior DEXA. Started Prolia 2013. Continue with Prolia this year with follow up DEXA next year at 2 year interval. Consider drug-free holiday at that time. 3. History of safety coil IUD placed 1983. Unable to remove per Dr. Cherylann Banas. Doing well without symptoms. Will leave in place  as per prior agreement. 4. Mammography 07/2015.  Had biopsy which showed atypical lobular hyperplasia. Saw Dr. Jana Hakim in follow up. Plan is for expectant management with annual mammography is. He did recommend a follow up with him in the spring and will make sure that she follows up with him for this. Due for mammogram now and she'll make arrangements for this. 5. Colonoscopy 2015. Repeat at their recommended interval. 6. Pap smear/HPV 2015 negative. No Pap smear done today. No history of significant abnormal Pap smears. Plan repeat Pap smear at 5 year interval per current screening guidelines. 7. Health maintenance. No routine lab work done as patient does this elsewhere. Follow up 1 year, sooner as needed.   Anastasio Auerbach MD, 9:52 AM 12/25/2015

## 2016-01-15 DIAGNOSIS — J0120 Acute ethmoidal sinusitis, unspecified: Secondary | ICD-10-CM | POA: Diagnosis not present

## 2016-01-15 DIAGNOSIS — R509 Fever, unspecified: Secondary | ICD-10-CM | POA: Diagnosis not present

## 2016-01-15 DIAGNOSIS — J020 Streptococcal pharyngitis: Secondary | ICD-10-CM | POA: Diagnosis not present

## 2016-01-29 ENCOUNTER — Encounter (HOSPITAL_BASED_OUTPATIENT_CLINIC_OR_DEPARTMENT_OTHER): Payer: Self-pay | Admitting: *Deleted

## 2016-02-01 ENCOUNTER — Ambulatory Visit (HOSPITAL_BASED_OUTPATIENT_CLINIC_OR_DEPARTMENT_OTHER)
Admission: RE | Admit: 2016-02-01 | Discharge: 2016-02-01 | Disposition: A | Discharge status: Home / Self Care | Payer: BLUE CROSS/BLUE SHIELD | Source: Ambulatory Visit | Attending: Orthopedic Surgery | Admitting: Orthopedic Surgery

## 2016-02-01 ENCOUNTER — Ambulatory Visit (HOSPITAL_BASED_OUTPATIENT_CLINIC_OR_DEPARTMENT_OTHER): Payer: BLUE CROSS/BLUE SHIELD | Admitting: Anesthesiology

## 2016-02-01 ENCOUNTER — Encounter (HOSPITAL_BASED_OUTPATIENT_CLINIC_OR_DEPARTMENT_OTHER): Payer: Self-pay | Admitting: Anesthesiology

## 2016-02-01 ENCOUNTER — Encounter (HOSPITAL_BASED_OUTPATIENT_CLINIC_OR_DEPARTMENT_OTHER)
Admission: RE | Disposition: A | Discharge status: Home / Self Care | Payer: Self-pay | Source: Ambulatory Visit | Attending: Orthopedic Surgery

## 2016-02-01 DIAGNOSIS — Z79899 Other long term (current) drug therapy: Secondary | ICD-10-CM | POA: Diagnosis not present

## 2016-02-01 DIAGNOSIS — Z7983 Long term (current) use of bisphosphonates: Secondary | ICD-10-CM | POA: Diagnosis not present

## 2016-02-01 DIAGNOSIS — I4891 Unspecified atrial fibrillation: Secondary | ICD-10-CM | POA: Diagnosis not present

## 2016-02-01 DIAGNOSIS — I10 Essential (primary) hypertension: Secondary | ICD-10-CM | POA: Diagnosis not present

## 2016-02-01 DIAGNOSIS — M65311 Trigger thumb, right thumb: Secondary | ICD-10-CM | POA: Diagnosis not present

## 2016-02-01 DIAGNOSIS — E039 Hypothyroidism, unspecified: Secondary | ICD-10-CM | POA: Insufficient documentation

## 2016-02-01 DIAGNOSIS — M810 Age-related osteoporosis without current pathological fracture: Secondary | ICD-10-CM | POA: Insufficient documentation

## 2016-02-01 DIAGNOSIS — M1990 Unspecified osteoarthritis, unspecified site: Secondary | ICD-10-CM | POA: Diagnosis not present

## 2016-02-01 HISTORY — PX: TRIGGER FINGER RELEASE: SHX641

## 2016-02-01 SURGERY — RELEASE, A1 PULLEY, FOR TRIGGER FINGER
Anesthesia: Monitor Anesthesia Care | Site: Hand | Laterality: Right

## 2016-02-01 MED ORDER — FENTANYL CITRATE (PF) 100 MCG/2ML IJ SOLN: 50.0000 ug | INTRAMUSCULAR | Status: DC | PRN

## 2016-02-01 MED ORDER — BUPIVACAINE HCL (PF) 0.25 % IJ SOLN
INTRAMUSCULAR | Status: AC
  Filled 2016-02-01: qty 30

## 2016-02-01 MED ORDER — ONDANSETRON HCL 4 MG/2ML IJ SOLN
INTRAMUSCULAR | Status: DC | PRN
  Administered 2016-02-01: 4 mg via INTRAVENOUS

## 2016-02-01 MED ORDER — CHLORHEXIDINE GLUCONATE 4 % EX LIQD: 60.0000 mL | Freq: Once | CUTANEOUS | Status: DC

## 2016-02-01 MED ORDER — BUPIVACAINE HCL (PF) 0.25 % IJ SOLN
INTRAMUSCULAR | Status: AC
  Filled 2016-02-01: qty 150

## 2016-02-01 MED ORDER — PROPOFOL 10 MG/ML IV BOLUS
INTRAVENOUS | Status: AC
  Filled 2016-02-01: qty 20

## 2016-02-01 MED ORDER — MIDAZOLAM HCL 2 MG/2ML IJ SOLN: 1.0000 mg | INTRAMUSCULAR | Status: DC | PRN

## 2016-02-01 MED ORDER — LACTATED RINGERS IV SOLN
INTRAVENOUS | Status: DC | PRN
  Administered 2016-02-01: 08:00:00 via INTRAVENOUS

## 2016-02-01 MED ORDER — LACTATED RINGERS IV SOLN: INTRAVENOUS | Status: DC

## 2016-02-01 MED ORDER — ONDANSETRON HCL 4 MG/2ML IJ SOLN
INTRAMUSCULAR | Status: AC
  Filled 2016-02-01: qty 2

## 2016-02-01 MED ORDER — SCOPOLAMINE 1 MG/3DAYS TD PT72: 1.0000 | MEDICATED_PATCH | Freq: Once | TRANSDERMAL | Status: DC | PRN

## 2016-02-01 MED ORDER — MIDAZOLAM HCL 5 MG/5ML IJ SOLN
INTRAMUSCULAR | Status: DC | PRN
  Administered 2016-02-01: 2 mg via INTRAVENOUS

## 2016-02-01 MED ORDER — FENTANYL CITRATE (PF) 100 MCG/2ML IJ SOLN
INTRAMUSCULAR | Status: AC
  Filled 2016-02-01: qty 2

## 2016-02-01 MED ORDER — FENTANYL CITRATE (PF) 100 MCG/2ML IJ SOLN
INTRAMUSCULAR | Status: DC | PRN
  Administered 2016-02-01: 100 ug via INTRAVENOUS

## 2016-02-01 MED ORDER — BUPIVACAINE HCL (PF) 0.25 % IJ SOLN
INTRAMUSCULAR | Status: DC | PRN
  Administered 2016-02-01: 4 mL

## 2016-02-01 MED ORDER — LIDOCAINE HCL (PF) 1 % IJ SOLN
INTRAMUSCULAR | Status: AC
  Filled 2016-02-01: qty 90

## 2016-02-01 MED ORDER — LIDOCAINE HCL (PF) 0.5 % IJ SOLN
INTRAMUSCULAR | Status: DC | PRN
  Administered 2016-02-01: 30 mL via INTRAVENOUS

## 2016-02-01 MED ORDER — PROPOFOL 10 MG/ML IV BOLUS
INTRAVENOUS | Status: DC | PRN
  Administered 2016-02-01: 30 mg via INTRAVENOUS

## 2016-02-01 MED ORDER — CEFAZOLIN SODIUM-DEXTROSE 2-4 GM/100ML-% IV SOLN
2.0000 g | INTRAVENOUS | Status: AC
  Administered 2016-02-01: 2 g via INTRAVENOUS

## 2016-02-01 MED ORDER — MIDAZOLAM HCL 2 MG/2ML IJ SOLN
INTRAMUSCULAR | Status: AC
  Filled 2016-02-01: qty 2

## 2016-02-01 MED ORDER — TRAMADOL HCL 50 MG PO TABS: 50.0000 mg | ORAL_TABLET | Freq: Four times a day (QID) | ORAL | 0 refills | Status: DC | PRN

## 2016-02-01 SURGICAL SUPPLY — 37 items
BANDAGE COBAN STERILE 2 (GAUZE/BANDAGES/DRESSINGS) ×2 IMPLANT
BLADE SURG 15 STRL LF DISP TIS (BLADE) ×1 IMPLANT
BLADE SURG 15 STRL SS (BLADE) ×2
BNDG CMPR 9X4 STRL LF SNTH (GAUZE/BANDAGES/DRESSINGS) ×1
BNDG ESMARK 4X9 LF (GAUZE/BANDAGES/DRESSINGS) ×1 IMPLANT
CHLORAPREP W/TINT 26ML (MISCELLANEOUS) ×2 IMPLANT
CORDS BIPOLAR (ELECTRODE) IMPLANT
COVER BACK TABLE 60X90IN (DRAPES) ×2 IMPLANT
COVER MAYO STAND STRL (DRAPES) ×2 IMPLANT
CUFF TOURNIQUET SINGLE 18IN (TOURNIQUET CUFF) ×1 IMPLANT
DECANTER SPIKE VIAL GLASS SM (MISCELLANEOUS) IMPLANT
DRAPE EXTREMITY T 121X128X90 (DRAPE) ×2 IMPLANT
DRAPE SURG 17X23 STRL (DRAPES) ×2 IMPLANT
GAUZE SPONGE 2X2 12PLY NS (GAUZE/BANDAGES/DRESSINGS) ×1 IMPLANT
GAUZE SPONGE 4X4 12PLY STRL (GAUZE/BANDAGES/DRESSINGS) ×2 IMPLANT
GAUZE XEROFORM 1X8 LF (GAUZE/BANDAGES/DRESSINGS) ×2 IMPLANT
GLOVE BIOGEL PI IND STRL 8 (GLOVE) IMPLANT
GLOVE BIOGEL PI IND STRL 8.5 (GLOVE) ×1 IMPLANT
GLOVE BIOGEL PI INDICATOR 8 (GLOVE) ×1
GLOVE BIOGEL PI INDICATOR 8.5 (GLOVE) ×1
GLOVE SURG ORTHO 8.0 STRL STRW (GLOVE) ×2 IMPLANT
GLOVE SURG SYN 7.5  E (GLOVE) ×1
GLOVE SURG SYN 7.5 E (GLOVE) ×1 IMPLANT
GLOVE SURG SYN 7.5 PF PI (GLOVE) IMPLANT
GOWN STRL REUS W/ TWL LRG LVL3 (GOWN DISPOSABLE) ×1 IMPLANT
GOWN STRL REUS W/TWL LRG LVL3 (GOWN DISPOSABLE) ×2
GOWN STRL REUS W/TWL XL LVL3 (GOWN DISPOSABLE) ×2 IMPLANT
NDL PRECISIONGLIDE 27X1.5 (NEEDLE) ×1 IMPLANT
NEEDLE PRECISIONGLIDE 27X1.5 (NEEDLE) ×2 IMPLANT
NS IRRIG 1000ML POUR BTL (IV SOLUTION) ×2 IMPLANT
PACK BASIN DAY SURGERY FS (CUSTOM PROCEDURE TRAY) ×2 IMPLANT
STOCKINETTE 4X48 STRL (DRAPES) ×2 IMPLANT
SUT ETHILON 4 0 PS 2 18 (SUTURE) ×4 IMPLANT
SYR BULB 3OZ (MISCELLANEOUS) ×2 IMPLANT
SYR CONTROL 10ML LL (SYRINGE) ×2 IMPLANT
TOWEL OR 17X24 6PK STRL BLUE (TOWEL DISPOSABLE) ×4 IMPLANT
UNDERPAD 30X30 (UNDERPADS AND DIAPERS) ×2 IMPLANT

## 2016-02-01 NOTE — Anesthesia Preprocedure Evaluation (Addendum)
Anesthesia Evaluation  Patient identified by MRN, date of birth, ID band Patient awake    Reviewed: Allergy & Precautions, H&P , NPO status , Patient's Chart, lab work & pertinent test results  Airway Mallampati: II  TM Distance: >3 FB Neck ROM: Full    Dental no notable dental hx. (+) Teeth Intact, Dental Advisory Given   Pulmonary asthma ,    Pulmonary exam normal breath sounds clear to auscultation       Cardiovascular hypertension, Pt. on medications  Rhythm:Regular Rate:Normal     Neuro/Psych negative neurological ROS  negative psych ROS   GI/Hepatic negative GI ROS, Neg liver ROS,   Endo/Other  Hypothyroidism   Renal/GU negative Renal ROS  negative genitourinary   Musculoskeletal  (+) Arthritis , Osteoarthritis,    Abdominal   Peds  Hematology negative hematology ROS (+)   Anesthesia Other Findings   Reproductive/Obstetrics negative OB ROS                            Anesthesia Physical Anesthesia Plan  ASA: II  Anesthesia Plan: Bier Block and MAC   Post-op Pain Management:    Induction: Intravenous  Airway Management Planned: Simple Face Mask  Additional Equipment:   Intra-op Plan:   Post-operative Plan:   Informed Consent: I have reviewed the patients History and Physical, chart, labs and discussed the procedure including the risks, benefits and alternatives for the proposed anesthesia with the patient or authorized representative who has indicated his/her understanding and acceptance.   Dental advisory given  Plan Discussed with: CRNA  Anesthesia Plan Comments:         Anesthesia Quick Evaluation

## 2016-02-01 NOTE — Op Note (Signed)
Dictation Number [id number]

## 2016-02-01 NOTE — Brief Op Note (Signed)
02/01/2016  9:03 AM  PATIENT:  Robin [name]  60 y.o. female  PRE-OPERATIVE DIAGNOSIS:  STS right thumb  POST-OPERATIVE DIAGNOSIS:  STS right thumb  PROCEDURE:  Procedure(s) with comments: RELEASE TRIGGER FINGER/A-1 PULLEY, right thumb (Right) - FAB  SURGEON:  Surgeon(s) and Role:    * Daryll Brod, MD - Primary  PHYSICIAN ASSISTANT:   ASSISTANTS: none   ANESTHESIA:   local and regional  EBL:  No intake/output data recorded.  BLOOD ADMINISTERED:none  DRAINS: none   LOCAL MEDICATIONS USED:  BUPIVICAINE   SPECIMEN:  No Specimen  DISPOSITION OF SPECIMEN:  N/A  COUNTS:  YES  TOURNIQUET:   Total Tourniquet Time Documented: Upper Arm (Right) - 17 minutes Total: Upper Arm (Right) - 17 minutes   DICTATION: .Other Dictation: Dictation Number [id number]  PLAN OF CARE: Discharge to home after PACU  PATIENT DISPOSITION:  PACU - hemodynamically stable.

## 2016-02-01 NOTE — Transfer of Care (Signed)
Immediate Anesthesia Transfer of Care Note  Patient: Robin [name]  Procedure(s) Performed: Procedure(s) with comments: RELEASE TRIGGER FINGER/A-1 PULLEY, right thumb (Right) - FAB  Patient Location: PACU  Anesthesia Type:MAC and Bier block  Level of Consciousness: awake, alert  and oriented  Airway & Oxygen Therapy: Patient Spontanous Breathing  Post-op Assessment: Report given to RN and Post -op Vital signs reviewed and stable  Post vital signs: Reviewed and stable  Last Vitals:  Vitals:   02/01/16 0745  BP: 134/83  Pulse: 83  Resp: 18  Temp: 36.8 C    Last Pain:  Vitals:   02/01/16 0745  TempSrc: Oral         Complications: No apparent anesthesia complications

## 2016-02-01 NOTE — H&P (Signed)
Robin [name] is an 60 y.o. female.   Chief Complaint: catching right thumb HPI: Robin [name] complains of the triggering of her right thumb.She has had 2 injections. She is now 60 years old and she is right-handed. She states that the thumb is now locked. She is also complaining of a small mass on the ulnar aspect of her forearm. And discomfort in the ulnar aspect of her right wrist. She is not complaining of great deal of discomfort with either of the 2. She is complaining primarily of any the inability to bend the IP joint of her thumb.      Past Medical History:  Diagnosis Date  . Arthritis   . Asthma    seasonal  . Atypical lobular hyperplasia (ALH) of left breast 07/2015  . Cancer ([place])    skin  . Crohn's disease ([place])    no problems  . Hypertension   . Hypothyroidism   . IUD (intrauterine device) in place 1983   Safety coil per Dr. Valeta Harms note attempted to retrieve but unable to do so  . Osteoporosis 12/2014    T score -2.6 stable from prior DEXA on Prolia  . Thyroid disease    Hypothyroid    Past Surgical History:  Procedure Laterality Date  . Basal and squamous cell Ca excised    . COLONOSCOPY N/A 07/01/2013   Procedure: COLONOSCOPY;  Surgeon: Garlan Fair, MD;  Location: [facility];  Service: Endoscopy;  Laterality: N/A;  . Fissure surg    . RADIOACTIVE SEED GUIDED EXCISIONAL BREAST BIOPSY Left 09/25/2015   Procedure: RADIOACTIVE SEED GUIDED EXCISIONAL BREAST BIOPSY;  Surgeon: Rolm Bookbinder, MD;  Location: [place];  Service: General;  Laterality: Left;    Family History  Problem Relation Age of Onset  . Colon cancer Mother   . Heart disease Mother     Atrial fibrillation  . Crohn's disease Mother   . Heart disease Maternal Grandmother   . Hypertension Maternal Grandmother   . Breast cancer Paternal Grandmother     age unknown   Social History:  reports that she has never smoked. She has never used smokeless tobacco.  She reports that she drinks about 1.2 oz of alcohol per week . She reports that she does not use drugs.  Allergies:  Allergies  Allergen Reactions  . Codeine Nausea Only    Needs something for nausea to go with pain pill  . Tetracyclines & Related Diarrhea  . Biaxin [Clarithromycin] Palpitations  . Clarithromycin Palpitations    No prescriptions prior to admission.    No results found for this or any previous visit (from the past 48 hour(s)).  No results found.   Pertinent items are noted in HPI.  Height 5' 5.5" (1.664 m), weight 72.6 kg (160 lb).  General appearance: alert, cooperative and appears stated age Head: Normocephalic, without obvious abnormality Neck: no JVD Resp: clear to auscultation bilaterally Cardio: regular rate and rhythm, S1, S2 normal, no murmur, click, rub or gallop GI: soft, non-tender; bowel sounds normal; no masses,  no organomegaly Extremities: catching right thumb Pulses: 2+ and symmetric Skin: Skin color, texture, turgor normal. No rashes or lesions Neurologic: Grossly normal Incision/Wound: na  Assessment/Plan Assessment:   1. Trigger finger of right thum 2. Benign connective tissue neoplasm   Plan: I discussed with Robin [name] she would like to have the A1 pulley release on her right thumb. Pre pari and postoperative course are discussed along with risk  complications. She is aware that there is no guarantee to the surgery the possibility of infection recurrence injury to arteries nerves tendons and incomplete relief of symptoms and dystrophy. She would like to proceed and is scheduled for release A1 pulley right thumb as an outpatient under regional anesthesia. She is very encouraged and answered to her satisfaction.      Scarlette Hogston R 02/01/2016, 4:52 AM

## 2016-02-01 NOTE — Discharge Instructions (Addendum)
°  Post Anesthesia Home Care Instructions  Activity: Get plenty of rest for the remainder of the day. A responsible adult should stay with you for 24 hours following the procedure.  For the next 24 hours, DO NOT: -Drive a car -Paediatric nurse -Drink alcoholic beverages -Take any medication unless instructed by your physician -Make any legal decisions or sign important papers.  Meals: Start with liquid foods such as gelatin or soup. Progress to regular foods as tolerated. Avoid greasy, spicy, heavy foods. If nausea and/or vomiting occur, drink only clear liquids until the nausea and/or vomiting subsides. Call your physician if vomiting continues.  Special Instructions/Symptoms: Your throat may feel dry or sore from the anesthesia or the breathing tube placed in your throat during surgery. If this causes discomfort, gargle with warm salt water. The discomfort should disappear within 24 hours.  If you had a scopolamine patch placed behind your ear for the management of post- operative nausea and/or vomiting:  1. The medication in the patch is effective for 72 hours, after which it should be removed.  Wrap patch in a tissue and discard in the trash. Wash hands thoroughly with soap and water. 2. You may remove the patch earlier than 72 hours if you experience unpleasant side effects which may include dry mouth, dizziness or visual disturbances. 3. Avoid touching the patch. Wash your hands with soap and water after contact with the patch.       [facility] Instructions Hand Surgery  Wound Care: Keep your hand elevated above the level of your heart.  Do not allow it to dangle by your side.  Keep the dressing dry and do not remove it unless your doctor advises you to do so.  He will usually change it at the time of your post-op visit.  Moving your fingers is advised to stimulate circulation but will depend on the site of your surgery.  If you have a splint applied, your doctor will advise you  regarding movement.  Activity: Do not drive or operate machinery today.  Rest today and then you may return to your normal activity and work as indicated by your physician.  Diet:  Drink liquids today or eat a light diet.  You may resume a regular diet tomorrow.    General expectations: Pain for two to three days. Fingers may become slightly swollen.  Call your doctor if any of the following occur: Severe pain not relieved by pain medication. Elevated temperature. Dressing soaked with blood. Inability to move fingers. White or bluish color to fingers.

## 2016-02-01 NOTE — Anesthesia Postprocedure Evaluation (Signed)
Anesthesia Post Note  Patient: Robin [name]  Procedure(s) Performed: Procedure(s) (LRB): RELEASE TRIGGER FINGER/A-1 PULLEY, right thumb (Right)  Patient location during evaluation: PACU Anesthesia Type: MAC Level of consciousness: awake and alert Pain management: pain level controlled Vital Signs Assessment: post-procedure vital signs reviewed and stable Respiratory status: spontaneous breathing, nonlabored ventilation and respiratory function stable Cardiovascular status: stable and blood pressure returned to baseline Anesthetic complications: no    Last Vitals:  Vitals:   02/01/16 0918 02/01/16 0942  BP: 130/73 129/67  Pulse: 72 80  Resp: 14 18  Temp:  36.8 C    Last Pain:  Vitals:   02/01/16 0942  TempSrc:   PainSc: 0-No pain                 Weslee Prestage,W. EDMOND

## 2016-02-02 NOTE — Op Note (Signed)
Robin [name], Robin [name]                ACCOUNT NO.:  [id number]  MEDICAL RECORD NO.:  [id number]  LOCATION:                                 FACILITY:  PHYSICIAN:  Daryll Brod, M.D.       DATE OF BIRTH:  08-14-1955  DATE OF PROCEDURE:  02/01/2016 DATE OF DISCHARGE:                              OPERATIVE REPORT   PREOPERATIVE DIAGNOSIS:  Stenosing tenosynovitis right thumb.  POSTOPERATIVE DIAGNOSIS:  Stenosing tenosynovitis right thumb.  OPERATION:  Release A1 pulley right thumb.  SURGEON:  Daryll Brod, M.D.  ASSISTANT:  None.  ANESTHESIA:  Forearm based IV regional with local infiltration.  PLACE OF SURGERY:  [facility].  ANESTHESIOLOGIST:  Soledad Gerlach, M.D.  HISTORY:  The patient is a 60 year old female with a history of triggering of her right thumb.  This has not responded to conservative treatment.  She has elected to undergo release of the A1 pulley.  Pre, peri, and postoperative course have been discussed along with risks and complications.  She is aware that there is no guarantee to the surgery; the possibility of infection; recurrence of injury to arteries, nerves, tendons; incomplete relief of symptoms and dystrophy.  In the preoperative area, the patient was seen, the extremity was marked by both patient and surgeon.  Antibiotic given.  PROCEDURE IN DETAIL:  The patient was brought to the operating room, where a forearm-based IV regional anesthetic was carried out without difficulty under the direction of Anesthesia Department.  She was prepped using ChloraPrep in supine position with the right arm free.  A 3-minute dry time was allowed and a time-out taken, confirming the patient and procedure.  A transverse incision was made over the metacarpophalangeal joint crease of her right thumb.  This was carried down through subcutaneous tissue.  The neurovascular bundles were identified radially and ulnarly.  The A1 pulley was identified and found to  be markedly thickened.  This was released on its radial aspect.  The thumb was placed through a full range of motion and no further triggering was noted.  The tenosynovial tissue proximally was separated with blunt dissection.  Wound was copiously irrigated with saline.  The skin was then closed with interrupted 4-0 nylon sutures.  Local infiltration with 0.25% bupivacaine without epinephrine was given.  On deflation of the tourniquet, all fingers immediately pinked, and a compressive dressing with the fingers and thumb free was applied.  The patient tolerated the procedure well.  She was taken to the recovery room for observation in satisfactory condition.  A total amount of bupivacaine 0.25% which was given was approximately 5 mL.  The patient is discharged to home to return to the [facility] in 1 week on tramadol.          ______________________________ Daryll Brod, M.D.     GK/MEDQ  D:  02/01/2016  T:  02/02/2016  Job:  [id number]

## 2016-02-05 ENCOUNTER — Encounter (HOSPITAL_BASED_OUTPATIENT_CLINIC_OR_DEPARTMENT_OTHER): Payer: Self-pay | Admitting: Orthopedic Surgery

## 2016-02-15 DIAGNOSIS — H2513 Age-related nuclear cataract, bilateral: Secondary | ICD-10-CM | POA: Diagnosis not present

## 2016-02-15 DIAGNOSIS — H524 Presbyopia: Secondary | ICD-10-CM | POA: Diagnosis not present

## 2016-02-26 HISTORY — PX: BREAST BIOPSY: SHX20

## 2016-02-26 HISTORY — PX: BREAST EXCISIONAL BIOPSY: SUR124

## 2016-03-21 DIAGNOSIS — L2089 Other atopic dermatitis: Secondary | ICD-10-CM | POA: Diagnosis not present

## 2016-03-21 DIAGNOSIS — Z85828 Personal history of other malignant neoplasm of skin: Secondary | ICD-10-CM | POA: Diagnosis not present

## 2016-03-21 DIAGNOSIS — L814 Other melanin hyperpigmentation: Secondary | ICD-10-CM | POA: Diagnosis not present

## 2016-03-21 DIAGNOSIS — L570 Actinic keratosis: Secondary | ICD-10-CM | POA: Diagnosis not present

## 2016-03-21 DIAGNOSIS — L821 Other seborrheic keratosis: Secondary | ICD-10-CM | POA: Diagnosis not present

## 2016-04-04 ENCOUNTER — Other Ambulatory Visit: Payer: Self-pay | Admitting: *Deleted

## 2016-04-04 DIAGNOSIS — N6092 Unspecified benign mammary dysplasia of left breast: Secondary | ICD-10-CM

## 2016-04-15 ENCOUNTER — Telehealth: Payer: Self-pay | Admitting: Gynecology

## 2016-04-15 NOTE — Telephone Encounter (Signed)
Prolia due after 05/20/16

## 2016-04-19 ENCOUNTER — Encounter: Payer: Self-pay | Admitting: Cardiology

## 2016-04-22 DIAGNOSIS — L570 Actinic keratosis: Secondary | ICD-10-CM | POA: Diagnosis not present

## 2016-04-22 DIAGNOSIS — L820 Inflamed seborrheic keratosis: Secondary | ICD-10-CM | POA: Diagnosis not present

## 2016-04-22 DIAGNOSIS — Z85828 Personal history of other malignant neoplasm of skin: Secondary | ICD-10-CM | POA: Diagnosis not present

## 2016-04-22 DIAGNOSIS — L814 Other melanin hyperpigmentation: Secondary | ICD-10-CM | POA: Diagnosis not present

## 2016-04-22 DIAGNOSIS — D225 Melanocytic nevi of trunk: Secondary | ICD-10-CM | POA: Diagnosis not present

## 2016-04-24 NOTE — Progress Notes (Signed)
HPI: Follow-up atrial fibrillation. Patient seen in the emergency room January 2016 with palpitations. She had an electrocardiogram that demonstrated fibrillation and converted spontaneously to sinus rhythm. She had an event monitor 1/16 that did not show evidence of atrial fibrillation. Her echocardiogram in February 2016 showed normal LV function, mild left ventricular hypertrophy, atrial septum aneurysmal. Exercise treadmill April 2017 with no ST changes. Apixaban recommended previously but pt declined. Since last seen, she denies dyspnea, chest pain, palpitations or syncope. She is under a significant amount of stress as her mother has been diagnosed with lung cancer.  Current Outpatient Prescriptions  Medication Sig Dispense Refill  . Biotin 5 MG CAPS Take 5 mg by mouth daily.     Marland Kitchen denosumab (PROLIA) 60 MG/ML SOLN injection Inject 60 mg into the skin every 6 (six) months. Administer in upper arm, thigh, or abdomen    . ibuprofen (ADVIL,MOTRIN) 600 MG tablet Take 1 tablet (600 mg total) by mouth every 6 (six) hours as needed. 30 tablet 0  . SYNTHROID 75 MCG tablet Take 1 tablet (75 mcg total) by mouth daily before breakfast. 90 tablet 3  . traMADol (ULTRAM) 50 MG tablet Take 1 tablet (50 mg total) by mouth every 6 (six) hours as needed. 30 tablet 0  . verapamil (CALAN-SR) 180 MG CR tablet Take 1 tablet (180 mg total) by mouth daily. 30 tablet 12   No current facility-administered medications for this visit.      Past Medical History:  Diagnosis Date  . Arthritis   . Asthma    seasonal  . Atypical lobular hyperplasia (ALH) of left breast 07/2015  . Cancer ([place])    skin  . Crohn's disease ([place])    no problems  . Hypertension   . Hypothyroidism   . IUD (intrauterine device) in place 1983   Safety coil per Dr. Valeta Harms note attempted to retrieve but unable to do so  . Osteoporosis 12/2014    T score -2.6 stable from prior DEXA on Prolia  . Thyroid disease    Hypothyroid      Past Surgical History:  Procedure Laterality Date  . Basal and squamous cell Ca excised    . COLONOSCOPY N/A 07/01/2013   Procedure: COLONOSCOPY;  Surgeon: Garlan Fair, MD;  Location: [facility];  Service: Endoscopy;  Laterality: N/A;  . Fissure surg    . RADIOACTIVE SEED GUIDED EXCISIONAL BREAST BIOPSY Left 09/25/2015   Procedure: RADIOACTIVE SEED GUIDED EXCISIONAL BREAST BIOPSY;  Surgeon: Rolm Bookbinder, MD;  Location: [place];  Service: General;  Laterality: Left;  . TRIGGER FINGER RELEASE Right 02/01/2016   Procedure: RELEASE TRIGGER FINGER/A-1 PULLEY, right thumb;  Surgeon: Daryll Brod, MD;  Location: [place];  Service: Orthopedics;  Laterality: Right;  FAB    Social History   Social History  . Marital status: Married    Spouse name: N/A  . Number of children: N/A  . Years of education: N/A   Occupational History  . Not on file.   Social History Main Topics  . Smoking status: Never Smoker  . Smokeless tobacco: Never Used  . Alcohol use 1.2 oz/week    2 Standard drinks or equivalent per week     Comment: Occasional  . Drug use: No  . Sexual activity: Yes    Birth control/ protection: Post-menopausal     Comment: Pt. declined sexual hx questions   Other Topics Concern  . Not on file  Social History Narrative  . No narrative on file    Family History  Problem Relation Age of Onset  . Colon cancer Mother   . Heart disease Mother     Atrial fibrillation  . Crohn's disease Mother   . Heart disease Maternal Grandmother   . Hypertension Maternal Grandmother   . Breast cancer Paternal Grandmother     age unknown    ROS: no fevers or chills, productive cough, hemoptysis, dysphasia, odynophagia, melena, hematochezia, dysuria, hematuria, rash, seizure activity, orthopnea, PND, pedal edema, claudication. Remaining systems are negative.  Physical Exam: Well-developed well-nourished in no acute distress.  Skin is warm and  dry.  HEENT is normal.  Neck is supple. No bruits Chest is clear to auscultation with normal expansion.  Cardiovascular exam is regular rate and rhythm.  Abdominal exam nontender or distended. No masses palpated. Extremities show no edema. neuro grossly intact  ECG- Sinus rhythm at a rate of 88. No ST changes; personally reviewed  A/P  1 Paroxysmal atrial fibrillation-patient remains in sinus rhythm. Continue verapamil for rate control if atrial fibrillation recurs. We recommended anticoagulation previously but she declines. She understands the high risk of CVA. Continue aspirin.  2 hypertension-blood pressure elevated. We will continue with verapamil. She feels her blood pressure may be related to stress which is certainly reasonable. She will follow this and we will increase medications as needed.  3 history of chest pain-no further symptoms. Treadmill negative. No further evaluation.  Kirk Ruths, MD

## 2016-04-29 ENCOUNTER — Ambulatory Visit (INDEPENDENT_AMBULATORY_CARE_PROVIDER_SITE_OTHER): Payer: BLUE CROSS/BLUE SHIELD | Admitting: Cardiology

## 2016-04-29 ENCOUNTER — Encounter: Payer: Self-pay | Admitting: Cardiology

## 2016-04-29 VITALS — BP 170/70 | HR 88 | Ht 65.0 in | Wt 162.0 lb

## 2016-04-29 DIAGNOSIS — I4891 Unspecified atrial fibrillation: Secondary | ICD-10-CM

## 2016-04-29 DIAGNOSIS — I10 Essential (primary) hypertension: Secondary | ICD-10-CM | POA: Diagnosis not present

## 2016-04-29 NOTE — Patient Instructions (Signed)
Your physician wants you to follow-up in: ONE YEAR WITH DR CRENSHAW You will receive a reminder letter in the mail two months in advance. If you don't receive a letter, please call our office to schedule the follow-up appointment.   If you need a refill on your cardiac medications before your next appointment, please call your pharmacy.  

## 2016-04-30 NOTE — Telephone Encounter (Signed)
Prolia due after 05/20/16. Calcium 9.0  08/16/15  . M81.0 No PA, No Deductible , Co pay with or without OV= $30.  OOPM  $5000 ($121mt). Left VM to call me and LM on date to come in for injection.

## 2016-05-01 ENCOUNTER — Encounter: Payer: Self-pay | Admitting: Gynecology

## 2016-05-02 NOTE — Telephone Encounter (Addendum)
PC from pt left Vm to call with time and date for Prolia after 05/20/16 complete with tf 11/2015

## 2016-05-07 NOTE — Telephone Encounter (Signed)
PC to Patient mother is very sick terminal lung cancer , Robin [name] wants to set up appointment for 05/21/16 at 9 am.  Nurse only

## 2016-05-10 ENCOUNTER — Emergency Department (HOSPITAL_COMMUNITY): Payer: BLUE CROSS/BLUE SHIELD

## 2016-05-10 ENCOUNTER — Emergency Department (HOSPITAL_COMMUNITY)
Admission: EM | Admit: 2016-05-10 | Discharge: 2016-05-10 | Disposition: A | Discharge status: Home / Self Care | Payer: BLUE CROSS/BLUE SHIELD | Attending: Emergency Medicine | Admitting: Emergency Medicine

## 2016-05-10 ENCOUNTER — Telehealth: Payer: Self-pay | Admitting: Cardiology

## 2016-05-10 ENCOUNTER — Encounter (HOSPITAL_COMMUNITY): Payer: Self-pay | Admitting: *Deleted

## 2016-05-10 DIAGNOSIS — I10 Essential (primary) hypertension: Secondary | ICD-10-CM | POA: Insufficient documentation

## 2016-05-10 DIAGNOSIS — E039 Hypothyroidism, unspecified: Secondary | ICD-10-CM | POA: Insufficient documentation

## 2016-05-10 DIAGNOSIS — Z85828 Personal history of other malignant neoplasm of skin: Secondary | ICD-10-CM | POA: Insufficient documentation

## 2016-05-10 DIAGNOSIS — J45909 Unspecified asthma, uncomplicated: Secondary | ICD-10-CM | POA: Diagnosis not present

## 2016-05-10 DIAGNOSIS — R000 Tachycardia, unspecified: Secondary | ICD-10-CM | POA: Diagnosis present

## 2016-05-10 DIAGNOSIS — Z79899 Other long term (current) drug therapy: Secondary | ICD-10-CM | POA: Insufficient documentation

## 2016-05-10 DIAGNOSIS — R002 Palpitations: Secondary | ICD-10-CM

## 2016-05-10 DIAGNOSIS — R079 Chest pain, unspecified: Secondary | ICD-10-CM | POA: Diagnosis not present

## 2016-05-10 LAB — BASIC METABOLIC PANEL
Anion gap: 8 (ref 5–15)
BUN: 6 mg/dL (ref 6–20)
CALCIUM: 9 mg/dL (ref 8.9–10.3)
CHLORIDE: 105 mmol/L (ref 101–111)
CO2: 28 mmol/L (ref 22–32)
CREATININE: 0.81 mg/dL (ref 0.44–1.00)
GFR calc non Af Amer: >60 mL/min (ref >60)
Glucose, Bld: 108 mg/dL — ABNORMAL HIGH (ref 65–99)
Potassium: 3.2 mmol/L — ABNORMAL LOW (ref 3.5–5.1)
SODIUM: 141 mmol/L (ref 135–145)

## 2016-05-10 LAB — CBC
HCT: 37.4 % (ref 36.0–46.0)
Hemoglobin: 11.7 g/dL — ABNORMAL LOW (ref 12.0–15.0)
MCH: 26.8 pg (ref 26.0–34.0)
MCHC: 31.3 g/dL (ref 30.0–36.0)
MCV: 85.8 fL (ref 78.0–100.0)
PLATELETS: 393 10*3/uL (ref 150–400)
RBC: 4.36 MIL/uL (ref 3.87–5.11)
RDW: 13.9 % (ref 11.5–15.5)
WBC: 8.5 10*3/uL (ref 4.0–10.5)

## 2016-05-10 LAB — I-STAT TROPONIN, ED: TROPONIN I, POC: 0 ng/mL (ref 0.00–0.08)

## 2016-05-10 MED ORDER — ALPRAZOLAM 0.5 MG PO TABS: 0.5000 mg | ORAL_TABLET | Freq: Two times a day (BID) | ORAL | 0 refills | Status: DC | PRN

## 2016-05-10 MED ORDER — ALPRAZOLAM 0.5 MG PO TABS
0.5000 mg | ORAL_TABLET | Freq: Once | ORAL | Status: AC
  Administered 2016-05-10: 0.5 mg via ORAL
  Filled 2016-05-10: qty 1

## 2016-05-10 NOTE — Telephone Encounter (Signed)
Returned call to patient she stated she is presently at [facility] ER waiting to be seen by Dr.Stated she has been waiting for over 3 hours to see Dr.She has already been worked up by the nurse.She has had a cxr,ekg,lab work.Stated she cannot wait any longer.Patient reassured advised try and wait on Dr,while talking to patient she stated she is being called back to see Dr now.

## 2016-05-10 NOTE — Telephone Encounter (Signed)
New Message  Pt voiced she is at ED waiting to be seen and wanting to be seen.  Pt voiced her mom passed and she know it's stress related and would like for nurse to return her call.  Please f/u

## 2016-05-10 NOTE — Discharge Instructions (Signed)
You were seen in the Ed today with racing heart beat. Your labs, chest x-ray, and EKG were all normal. We are giving you a small amount of Xanax to take as needed for anxiety and sleep.   Please call your PCP and Cardiologist in the coming days to arrange follow up visit. Return to the ED with any chest pain, difficulty breathing, passing out, or other concerning symptoms.   I am very sorry to hear about the loss of your mother.

## 2016-05-10 NOTE — Telephone Encounter (Signed)
Patient walked in to office w/complaints of "heart flying", rapid heart beat, lump in throat. HR in the 100s. She also reports elevated BP of 158/84 this AM. Her mother passed away 2022-05-20 evening and she was with her in the time surrounding her passing and was not sleeping, took benadryl to try to help her rest. She has h/o AF, which she denied - states she saw Dr. Wynonia Lawman and wore a monitor that did not show AF.   Per triage protocol, patient can have first available appointment in the office or go to hospital for eval/offer EMS if urgent. Advised that patient should seek ED eval - which patient eventually agreed to. She was advised to go to [facility] ED and I notified her that I would contact nurse first there to make them aware of her impending arrival.   Call made to [facility] ED - spoke w/Ashley, RN and provided background of patient/complaints.   Routed to Dr. Stanford Breed as Juluis Rainier

## 2016-05-10 NOTE — ED Provider Notes (Signed)
Emergency Department Provider Note   I have reviewed the triage vital signs and the nursing notes.   HISTORY  Chief Complaint Tachycardia   HPI Robin [name] is a 61 y.o. female with PMH of crohn's disease, HTN, Hypothyroidism, and osteoporosis resents to the emergency pertinent for evaluation of heart palpitations. Patient states that over the past week she lost her mother unexpectedly. She was here in the hospital with her for 5 days and not sleeping. Ultimately her mother died and she has been acutely grieving since. Since the death of her mother she's had heart palpitations increased stress. Husband at bedside states she's not been sleeping well and crying frequently. She denies any pain in her chest or trouble breathing. No history of similar. No lightheadedness or near syncope. No radiation of symptoms. She took Benadryl the past 2 nights but states that this mainly kept her awake. She called he Cardiologist who recommended ED evaluation.    Past Medical History:  Diagnosis Date  . Arthritis   . Asthma    seasonal  . Atypical lobular hyperplasia (ALH) of left breast 07/2015  . Cancer ([place])    skin  . Crohn's disease ([place])    no problems  . Hypertension   . Hypothyroidism   . IUD (intrauterine device) in place 1983   Safety coil per Dr. Valeta Harms note attempted to retrieve but unable to do so  . Osteoporosis 12/2014    T score -2.6 stable from prior DEXA on Prolia  . Thyroid disease    Hypothyroid    Patient Active Problem List   Diagnosis Date Noted  . Atypical lobular hyperplasia of left breast 08/07/2015  . Essential hypertension 05/08/2015  . Atrial fibrillation ([place]) 03/15/2014  . Chest pain 03/15/2014  . 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 Hypothyroidism 12/02/2011  . Osteoporosis     Past Surgical History:  Procedure Laterality Date  . Basal and squamous cell Ca excised    . COLONOSCOPY N/A 07/01/2013   Procedure: COLONOSCOPY;  Surgeon: Garlan Fair, MD;  Location: [facility];  Service: Endoscopy;  Laterality: N/A;  . Fissure surg    . RADIOACTIVE SEED GUIDED EXCISIONAL BREAST BIOPSY Left 09/25/2015   Procedure: RADIOACTIVE SEED GUIDED EXCISIONAL BREAST BIOPSY;  Surgeon: Rolm Bookbinder, MD;  Location: [facility];  Service: General;  Laterality: Left;  . TRIGGER FINGER RELEASE Right 02/01/2016   Procedure: RELEASE TRIGGER FINGER/A-1 PULLEY, right thumb;  Surgeon: Daryll Brod, MD;  Location: [facility];  Service: Orthopedics;  Laterality: Right;  FAB    Current Outpatient Rx  . Order #: [id number] Class: Print  . Order #: [id number] Class: Historical Med  . Order #: [id number] Class: Historical Med  . Order #: [id number] Class: Print  . Order #: [id number] Class: Fax  . Order #: [id number] Class: Print  . Order #: [id number] Class: Normal    Allergies Codeine; Tetracyclines & related; Biaxin [clarithromycin]; and Clarithromycin  Family History  Problem Relation Age of Onset  . Colon cancer Mother   . Heart disease Mother     Atrial fibrillation  . Crohn's disease Mother   . Heart disease Maternal Grandmother   . Hypertension Maternal Grandmother   . Breast cancer Paternal Grandmother     age unknown    Social History Social History  Substance Use Topics  . Smoking status: Never Smoker  . Smokeless tobacco: Never Used  . Alcohol use 1.2 oz/week    2 Standard drinks or  equivalent per week     Comment: Occasional    Review of Systems  Constitutional: No fever/chills Eyes: No visual changes. ENT: No sore throat. Cardiovascular: Denies chest pain. Positive heart palpitations.  Respiratory: Denies shortness of breath. Gastrointestinal: No abdominal pain.  No nausea, no vomiting.  No diarrhea.  No constipation. Genitourinary: Negative for dysuria. Musculoskeletal: Negative for back pain. Skin: Negative for rash. Neurological: Negative for headaches,  focal weakness or numbness.  10-point ROS otherwise negative.  ____________________________________________   PHYSICAL EXAM:  VITAL SIGNS: ED Triage Vitals [05/10/16 1319]  Enc Vitals Group     BP (!) 164/93     Pulse Rate 96     Resp (!) 23     Temp 98.3 F (36.8 C)     Temp Source Oral     SpO2 98 %   Constitutional: Alert and oriented. Well appearing and in no acute distress. Eyes: Conjunctivae are normal.  Head: Atraumatic. Nose: No congestion/rhinnorhea. Mouth/Throat: Mucous membranes are moist.  Oropharynx non-erythematous. Neck: No stridor.   Cardiovascular: Sinus tachycardia. Good peripheral circulation. Grossly normal heart sounds.   Respiratory: Normal respiratory effort.  No retractions. Lungs CTAB. Gastrointestinal: Soft and nontender. No distention.  Musculoskeletal: No lower extremity tenderness nor edema. No gross deformities of extremities. Neurologic:  Normal speech and language. No gross focal neurologic deficits are appreciated.  Skin:  Skin is warm, dry and intact. No rash noted. Psychiatric: Mood and affect are normal. Speech and behavior are normal.  ____________________________________________   LABS (all labs ordered are listed, but only abnormal results are displayed)  Labs Reviewed  BASIC METABOLIC PANEL - Abnormal; Notable for the following:       Result Value   Potassium 3.2 (*)    Glucose, Bld 108 (*)    All other components within normal limits  CBC - Abnormal; Notable for the following:    Hemoglobin 11.7 (*)    All other components within normal limits  I-STAT TROPOININ, ED   ____________________________________________  EKG   EKG Interpretation  Date/Time:  Friday May 10 2016 13:15:28 EDT Ventricular Rate:  106 PR Interval:  130 QRS Duration: 80 QT Interval:  344 QTC Calculation: 456 R Axis:   50 Text Interpretation:  Sinus tachycardia Possible Lateral infarct , age undetermined Abnormal ECG No STEMI.  Confirmed by LONG  MD, JOSHUA [phone]) on 05/10/2016 4:25:55 PM       ____________________________________________  RADIOLOGY  Dg Chest 2 View  Result Date: 05/10/2016 CLINICAL DATA:  Chest pain.  Tachycardia. EXAM: CHEST  2 VIEW COMPARISON:  CT 08/16/2015.  Chest x-ray 08/15/2015 . FINDINGS: Mediastinum hilar structures normal. Mild bilateral pleural-parenchymal thickening and or scarring. No focal alveolar infiltrate. Heart size normal. No acute bony abnormality identified. No pleural effusion or pneumothorax. IMPRESSION: Pleural-parenchymal thickening noted consistent with scarring. No acute abnormality identified. Electronically Signed   By: Marcello Moores  Register   On: 05/10/2016 13:41    ____________________________________________   PROCEDURES  Procedure(s) performed:   Procedures  None ____________________________________________   INITIAL IMPRESSION / ASSESSMENT AND PLAN / ED COURSE  Pertinent labs & imaging results that were available during my care of the patient were reviewed by me and considered in my medical decision making (see chart for details).  Patient resents emergency department for evaluation of heart palpitations in the setting of experiencing an acute grief. Symptoms started in the setting of losing her mother. The patient has been increasingly anxious with heart palpitations since that time. During my exam  she is intermittently crying. When I'm able to comfort her and help her to calm down her heart rate returned to normal. Chest x-ray and lab work from the emergency department waiting room is unremarkable. Very low suspicion for acute heart failure or ACS. Patient does have a history of hypothyroid and is on Synthroid. She will follow with her primary care physician who manages her Synthroid but suspect that her symptoms are 2/2 acute grief. Will give Xanax to help with acute symptoms and reassess.   05:25 PM On reassessment the patient is feeling dramatically improvement. Her heart  rate is in the 70s her blood pressure is in the systolic 767M. She no longer feels the racing heartbeat. Plan to discharge home with a small amount of Xanax to help her through this acute period. She will follow with her primary care physician as discussed previously.  At this time, I do not feel there is any life-threatening condition present. I have reviewed and discussed all results (EKG, imaging, lab, urine as appropriate), exam findings with patient. I have reviewed nursing notes and appropriate previous records.  I feel the patient is safe to be discharged home without further emergent workup. Discussed usual and customary return precautions. Patient and family (if present) verbalize understanding and are comfortable with this plan.  Patient will follow-up with their primary care provider. If they do not have a primary care provider, information for follow-up has been provided to them. All questions have been answered.  ____________________________________________  FINAL CLINICAL IMPRESSION(S) / ED DIAGNOSES  Final diagnoses:  Palpitations     MEDICATIONS GIVEN DURING THIS VISIT:  Medications  ALPRAZolam (XANAX) tablet 0.5 mg (0.5 mg Oral Given 05/10/16 1648)     NEW OUTPATIENT MEDICATIONS STARTED DURING THIS VISIT:  Discharge Medication List as of 05/10/2016  5:29 PM    START taking these medications   Details  ALPRAZolam (XANAX) 0.5 MG tablet Take 1 tablet (0.5 mg total) by mouth 2 (two) times daily as needed for anxiety or sleep., Starting Fri 05/10/2016, Print          Note:  This document was prepared using Dragon voice recognition software and may include unintentional dictation errors.  Nanda Quinton, MD Emergency Medicine   Margette Fast, MD 05/11/16 [id number]

## 2016-05-10 NOTE — ED Triage Notes (Signed)
Pt reports being with mother during recent diagnosis of lung cancer & recent death, pt states, "It was very stressful and I stayed in the hospital for 5 days. I have not slept, but today my heart is flying." pt denies denies SOB, denies n/v/d, pt A&O x4

## 2016-05-13 ENCOUNTER — Telehealth: Payer: Self-pay | Admitting: Emergency Medicine

## 2016-05-13 NOTE — Telephone Encounter (Signed)
Left message on patient's voicemail with date and time for upcoming mammogram. Advised patient to call this office for any concerns regarding this.

## 2016-05-15 ENCOUNTER — Telehealth: Payer: Self-pay | Admitting: Oncology

## 2016-05-15 NOTE — Telephone Encounter (Signed)
lvm to inform pt of r/s 4/12 appt to 9/12 per GM on PAL

## 2016-05-21 ENCOUNTER — Ambulatory Visit (INDEPENDENT_AMBULATORY_CARE_PROVIDER_SITE_OTHER): Payer: BLUE CROSS/BLUE SHIELD | Admitting: Gynecology

## 2016-05-21 DIAGNOSIS — M810 Age-related osteoporosis without current pathological fracture: Secondary | ICD-10-CM | POA: Diagnosis not present

## 2016-05-21 MED ORDER — DENOSUMAB 60 MG/ML ~~LOC~~ SOLN
60.0000 mg | Freq: Once | SUBCUTANEOUS | Status: AC
  Administered 2016-05-21: 60 mg via SUBCUTANEOUS

## 2016-05-22 NOTE — Telephone Encounter (Signed)
Next Injection prolia due 11/22/16

## 2016-05-29 ENCOUNTER — Other Ambulatory Visit: Payer: Self-pay | Admitting: Oncology

## 2016-05-29 ENCOUNTER — Ambulatory Visit
Admission: RE | Admit: 2016-05-29 | Discharge: 2016-05-29 | Disposition: A | Discharge status: Home / Self Care | Payer: BLUE CROSS/BLUE SHIELD | Source: Ambulatory Visit | Attending: Oncology | Admitting: Oncology

## 2016-05-29 DIAGNOSIS — R928 Other abnormal and inconclusive findings on diagnostic imaging of breast: Secondary | ICD-10-CM

## 2016-05-29 DIAGNOSIS — N6092 Unspecified benign mammary dysplasia of left breast: Secondary | ICD-10-CM

## 2016-05-29 DIAGNOSIS — N6001 Solitary cyst of right breast: Secondary | ICD-10-CM | POA: Diagnosis not present

## 2016-05-30 DIAGNOSIS — F439 Reaction to severe stress, unspecified: Secondary | ICD-10-CM | POA: Diagnosis not present

## 2016-06-06 ENCOUNTER — Ambulatory Visit: Payer: Managed Care, Other (non HMO) | Admitting: Oncology

## 2016-07-26 ENCOUNTER — Other Ambulatory Visit: Payer: Self-pay | Admitting: Surgery

## 2016-07-26 DIAGNOSIS — E042 Nontoxic multinodular goiter: Secondary | ICD-10-CM

## 2016-07-29 DIAGNOSIS — D225 Melanocytic nevi of trunk: Secondary | ICD-10-CM | POA: Diagnosis not present

## 2016-07-29 DIAGNOSIS — D485 Neoplasm of uncertain behavior of skin: Secondary | ICD-10-CM | POA: Diagnosis not present

## 2016-07-29 DIAGNOSIS — L821 Other seborrheic keratosis: Secondary | ICD-10-CM | POA: Diagnosis not present

## 2016-07-29 DIAGNOSIS — L570 Actinic keratosis: Secondary | ICD-10-CM | POA: Diagnosis not present

## 2016-07-29 DIAGNOSIS — Z85828 Personal history of other malignant neoplasm of skin: Secondary | ICD-10-CM | POA: Diagnosis not present

## 2016-07-29 DIAGNOSIS — E042 Nontoxic multinodular goiter: Secondary | ICD-10-CM | POA: Diagnosis not present

## 2016-07-29 DIAGNOSIS — L814 Other melanin hyperpigmentation: Secondary | ICD-10-CM | POA: Diagnosis not present

## 2016-08-06 ENCOUNTER — Ambulatory Visit
Admission: RE | Admit: 2016-08-06 | Discharge: 2016-08-06 | Disposition: A | Discharge status: Home / Self Care | Payer: BLUE CROSS/BLUE SHIELD | Source: Ambulatory Visit | Attending: Surgery | Admitting: Surgery

## 2016-08-06 DIAGNOSIS — E042 Nontoxic multinodular goiter: Secondary | ICD-10-CM

## 2016-08-12 DIAGNOSIS — E042 Nontoxic multinodular goiter: Secondary | ICD-10-CM | POA: Diagnosis not present

## 2016-09-09 ENCOUNTER — Encounter: Payer: Self-pay | Admitting: Gastroenterology

## 2016-09-17 DIAGNOSIS — K50118 Crohn's disease of large intestine with other complication: Secondary | ICD-10-CM | POA: Diagnosis not present

## 2016-09-17 DIAGNOSIS — E559 Vitamin D deficiency, unspecified: Secondary | ICD-10-CM | POA: Diagnosis not present

## 2016-09-17 DIAGNOSIS — L820 Inflamed seborrheic keratosis: Secondary | ICD-10-CM | POA: Diagnosis not present

## 2016-09-17 DIAGNOSIS — R000 Tachycardia, unspecified: Secondary | ICD-10-CM | POA: Diagnosis not present

## 2016-09-17 DIAGNOSIS — D485 Neoplasm of uncertain behavior of skin: Secondary | ICD-10-CM | POA: Diagnosis not present

## 2016-09-17 DIAGNOSIS — Z0000 Encounter for general adult medical examination without abnormal findings: Secondary | ICD-10-CM | POA: Diagnosis not present

## 2016-09-17 DIAGNOSIS — E7800 Pure hypercholesterolemia, unspecified: Secondary | ICD-10-CM | POA: Diagnosis not present

## 2016-09-17 DIAGNOSIS — L570 Actinic keratosis: Secondary | ICD-10-CM | POA: Diagnosis not present

## 2016-09-17 DIAGNOSIS — D649 Anemia, unspecified: Secondary | ICD-10-CM | POA: Diagnosis not present

## 2016-10-24 ENCOUNTER — Telehealth: Payer: Self-pay | Admitting: Gynecology

## 2016-10-24 NOTE — Telephone Encounter (Signed)
prolia due  11/22/16  Calcium 9.0 05/10/16. Complete exam TF 11/2016

## 2016-10-31 ENCOUNTER — Encounter (INDEPENDENT_AMBULATORY_CARE_PROVIDER_SITE_OTHER): Payer: Self-pay

## 2016-10-31 ENCOUNTER — Ambulatory Visit (INDEPENDENT_AMBULATORY_CARE_PROVIDER_SITE_OTHER): Payer: Managed Care, Other (non HMO) | Admitting: Gastroenterology

## 2016-10-31 ENCOUNTER — Encounter: Payer: Self-pay | Admitting: Gastroenterology

## 2016-10-31 VITALS — BP 120/80 | HR 80 | Ht 65.0 in | Wt 152.4 lb

## 2016-10-31 DIAGNOSIS — K219 Gastro-esophageal reflux disease without esophagitis: Secondary | ICD-10-CM

## 2016-10-31 DIAGNOSIS — R933 Abnormal findings on diagnostic imaging of other parts of digestive tract: Secondary | ICD-10-CM | POA: Diagnosis not present

## 2016-10-31 DIAGNOSIS — K50818 Crohn's disease of both small and large intestine with other complication: Secondary | ICD-10-CM | POA: Diagnosis not present

## 2016-10-31 MED ORDER — SUPREP BOWEL PREP KIT 17.5-3.13-1.6 GM/177ML PO SOLN: 1.0000 | ORAL | 0 refills | Status: DC

## 2016-10-31 MED ORDER — RANITIDINE HCL 150 MG PO TABS: 150.0000 mg | ORAL_TABLET | Freq: Two times a day (BID) | ORAL | 1 refills | Status: DC

## 2016-10-31 NOTE — Patient Instructions (Addendum)
You have been scheduled for an endoscopy and colonoscopy. Please follow the written instructions given to you at your visit today. Please pick up your prep supplies at the pharmacy within the next 1-3 days. If you use inhalers (even only as needed), please bring them with you on the day of your procedure. Your physician has requested that you go to www.startemmi.com and enter the access code given to you at your visit today. This web site gives a general overview about your procedure. However, you should still follow specific instructions given to you by our office regarding your preparation for the procedure.  We have sent the following medications to your pharmacy for you to pick up at your convenience: Zantac 150 mg twice daily x 1 month.   You have been scheduled for an limited abdominal ultrasound at [facility] (1st floor of hospital) on Monday, 11/11/16 at 8:30 am. Please arrive 15 minutes prior to your appointment for registration. Make certain not to have anything to eat or drink 6 hours prior to your appointment. Should you need to reschedule your appointment, please contact radiology at [phone]. This test typically takes about 30 minutes to perform.  If you are age 56 or older, your body mass index should be between 23-30. Your Body mass index is 25.36 kg/m. If this is out of the aforementioned range listed, please consider follow up with your Primary Care Provider.  If you are age 59 or younger, your body mass index should be between 19-25. Your Body mass index is 25.36 kg/m. If this is out of the aformentioned range listed, please consider follow up with your Primary Care Provider.

## 2016-10-31 NOTE — Progress Notes (Signed)
 HPI :  61 y/o female with a history of Crohn's disease, skin cancer, osteoporosis, referred here by Dr. Robert Gates for long term management of Crohn's disease. She has followed with Dr. Martin Johnson previously, who has recently retired.  Reported Crohn's colitis longstanding. She reports a history of anal fissures longstanding. Told she had Crohn's around 20 years ago.  She reports colonic involvement - she reports ulcerations in her colon historically although prior colonoscopies also show ileal inflammation. Last colonoscopy done May 2015 - active Crohns colitis - colonic ulcers and stricture of the sigmoid colon. She has been on prednisone in the past periodically. No prior hospitalizations. No other medications for Crohn's other than prednisone. She has 2 BMs per day on average. She has a history of colonic stricture in the sigmoid colon, needed to use ultraslim scope to traverse it. .Mother had Crohn's. She has never been on any maintenance medications for her disease.   When she has active Crohn's disease she had diarrhea , abdominal pains, urgency of stools.  She reports feeling well now in general in regards to her bowel habits. She does have some LUQ pain - 3-4 weeks, it comes and goes. Sporadic. Eating does not make it worse or precipitate it. Having a bowel movement makes it better. The urge to have a bowel movement does not always make it worse. She thinks it is feeling better recently. No blood in the stools.   She also questions where or not she has GERD - she reports a "hot mucous" in her chest, and bothering her at night, with coughing. She is taking Robitussin which has helped. She feels coarse in her chest. She took protonix for 2 weeks and felt that she had aches / pains, and did not tolerate it. She stopped it and these symptoms went away. She is using TUMS at night and apple cider vinegar. She has been on inhalers in the past for seasonal asthma. She feels a burning in her  throat and "hot air" in her throat, along with mucous. No regurgitation or acid taste in her mouth. She could not tell if protonix helped her much with this. TUMS and apple cider vinegar can help at times. No prior endoscopy. No FH of esophageal cancer. No dysphagia. She has some hoarseness of her voice. No prior tobacco use.  CXR in March was normal. CT angio of the chest done 07/2015.  She's had a prior laryngoscopy per ENT, no records available. Of note, on prior CT scan in 2017 she was noted to have "hypodensities up to 2cm in size in the liver".    Health Care Maintenance: - PCV13 and PPV 23 2016/2017 - flu shot UTD  Prior colonoscopies: 10/01/2010 - active Crohn's colitis 2007 - normal ileum, active colitis 1994 - colitis and ileitis  Past Medical History:  Diagnosis Date  . Arthritis   . Asthma    seasonal  . Atypical lobular hyperplasia (ALH) of left breast 07/2015  . Cancer ([facility])    skin  . Crohn's disease ([facility])    no problems  . Hypertension   . Hypothyroidism   . IUD (intrauterine device) in place 1983   Safety coil per Dr. Gottsegen's note attempted to retrieve but unable to do so  . Osteoporosis 12/2014    T score -2.6 stable from prior DEXA on Prolia  . Thyroid disease    Hypothyroid     Past Surgical History:  Procedure Laterality Date  . Basal and squamous   cell Ca excised    . COLONOSCOPY N/A 07/01/2013   Procedure: COLONOSCOPY;  Surgeon: Garlan Fair, MD;  Location: [facility];  Service: Endoscopy;  Laterality: N/A;  . Fissure surg    . RADIOACTIVE SEED GUIDED EXCISIONAL BREAST BIOPSY Left 09/25/2015   Procedure: RADIOACTIVE SEED GUIDED EXCISIONAL BREAST BIOPSY;  Surgeon: Rolm Bookbinder, MD;  Location: [facility];  Service: General;  Laterality: Left;  . TRIGGER FINGER RELEASE Right 02/01/2016   Procedure: RELEASE TRIGGER FINGER/A-1 PULLEY, right thumb;  Surgeon: Daryll Brod, MD;  Location: [facility];  Service: Orthopedics;   Laterality: Right;  FAB   Family History  Problem Relation Age of Onset  . Colon cancer Mother   . Heart disease Mother        Atrial fibrillation  . Crohn's disease Mother   . Heart disease Maternal Grandmother   . Hypertension Maternal Grandmother   . Breast cancer Paternal Grandmother        age unknown   Social History  Substance Use Topics  . Smoking status: Never Smoker  . Smokeless tobacco: Never Used  . Alcohol use 1.2 oz/week    2 Standard drinks or equivalent per week     Comment: Occasional   Current Outpatient Prescriptions  Medication Sig Dispense Refill  . Biotin 5 MG CAPS Take 5 mg by mouth daily.     Marland Kitchen denosumab (PROLIA) 60 MG/ML SOLN injection Inject 60 mg into the skin every 6 (six) months. Administer in upper arm, thigh, or abdomen    . verapamil (CALAN-SR) 180 MG CR tablet Take 1 tablet (180 mg total) by mouth daily. 30 tablet 12  . ranitidine (ZANTAC) 150 MG tablet Take 1 tablet (150 mg total) by mouth 2 (two) times daily. 30 tablet 1  . SUPREP BOWEL PREP KIT 17.5-3.13-1.6 GM/180ML SOLN Take 1 kit by mouth as directed. 354 mL 0  . SYNTHROID 75 MCG tablet Take 1 tablet (75 mcg total) by mouth daily before breakfast. 90 tablet 3   No current facility-administered medications for this visit.    Allergies  Allergen Reactions  . Codeine Nausea Only    Needs something for nausea to go with pain pill  . Tetracyclines & Related Diarrhea  . Biaxin [Clarithromycin] Palpitations  . Clarithromycin Palpitations     Review of Systems: All systems reviewed and negative except where noted in HPI.    No results found.  No labs available at this time, awaiting records  Physical Exam: BP 120/80   Pulse 80   Ht 5' 5" (1.651 m)   Wt 152 lb 6 oz (69.1 kg)   BMI 25.36 kg/m  Constitutional: Pleasant,well-developed, female in no acute distress. HEENT: Normocephalic and atraumatic. Conjunctivae are normal. No scleral icterus. Neck supple.  Cardiovascular: Normal  rate, regular rhythm.  Pulmonary/chest: Effort normal and breath sounds normal. No wheezing, rales or rhonchi. Abdominal: Soft, nondistended, nontender.  There are no masses palpable. No hepatomegaly. Extremities: no edema Lymphadenopathy: No cervical adenopathy noted. Neurological: Alert and oriented to person place and time. Skin: Skin is warm and dry. No rashes noted. Psychiatric: Normal mood and affect. Behavior is normal.   ASSESSMENT AND PLAN: 61 year old female here for new patient consultation to discuss the following issues:  Crohn's disease - she's had long-standing Crohn's disease, using prednisone as needed, no maintenance therapy. She has osteoporosis I suspect related to steroid use and on treatment for that. She's had active inflammation in her colon on all of  her prior colonoscopies, including some ileal inflammation. She states she prefers to treat this "naturally" and has declined Crohn's disease medications for the past. I had a lengthy conversation with her about Crohn's disease, complications of it, increased risk of colon cancer with active inflammation in her colon. Goal of therapy is to have a steroid free remission. I'm concerned about the stricture noted on her last colonoscopy. She has ongoing intermittent abdominal pains I suspect related to her disease. She's quite anxious about having a new provider for her Crohn's, I understand how she feels about medications and we'll hold off on recommendations for that today (although I think she warrants biologic therapy). I'm recommending a colonoscopy to restage her disease, assess for active inflammation, rule out dysplasia, reassess the stricture. Based on this I will discuss therapy with her. She agreed with the plan.  GERD / cough / globus - it's uncertain if her upper chest symptoms are related to reflux, I think he may be less likely. She has a history of asthma, endorses cough, mucus production. She did not tolerate a PPI as  above, we will try her on a course of Zantac twice a day for month. I also offered her an upper endoscopy to further evaluate. She is very anxious about the possibility for malignancy in her lungs as her mother died of lung cancer. I reassured her last chest x-ray appeared normal. This is the issue she is most concerned about at this point, and stress that to me today. I offered her consultation to pulmonary for this issue, she wants to hold off for now and await her EGD in response to trial of Zantac which I think is reasonable. If more objective data is needed to clarify if she has reflux driving this process we can consider pH study  Abnormal liver imaging - nonspecific hypodensities noted in the liver on prior CT scan 2017. It is unclear what this represents based on radiology report, hopefully benign cysts. Recommending an ultrasound of the liver to further evaluate. I'm also asking for her labs from her primary care physician to ensure normal. She agreed  The patient had several questions about all these issues today which I spent more than 45 minutes total with her obtaining history and answering her questions.   Goodridge Cellar, MD [facility] Pager [phone]  CC: Josetta Huddle, MD

## 2016-11-06 ENCOUNTER — Ambulatory Visit: Payer: BLUE CROSS/BLUE SHIELD | Admitting: Oncology

## 2016-11-06 ENCOUNTER — Telehealth: Payer: Self-pay | Admitting: Gastroenterology

## 2016-11-06 NOTE — Telephone Encounter (Signed)
Labs from 09/17/16 from primary care came in:  Hgb 11.7, WBC 9.5, MCV 84, plt 473  Normal BMET Normal LFTS - ALT 16 Iron level of 19 with iron sat of 6% - c/w iron deficiency TSH normal Vitamin D level of 39   We will await EGD / colonoscopy as scheduled and US liver

## 2016-11-07 ENCOUNTER — Other Ambulatory Visit: Payer: Self-pay

## 2016-11-07 ENCOUNTER — Ambulatory Visit (HOSPITAL_BASED_OUTPATIENT_CLINIC_OR_DEPARTMENT_OTHER): Payer: Managed Care, Other (non HMO) | Admitting: Oncology

## 2016-11-07 ENCOUNTER — Other Ambulatory Visit: Payer: Self-pay | Admitting: Cardiology

## 2016-11-07 VITALS — BP 122/85 | HR 93 | Temp 98.4°F | Resp 18 | Ht 65.0 in | Wt 152.0 lb

## 2016-11-07 DIAGNOSIS — N6092 Unspecified benign mammary dysplasia of left breast: Secondary | ICD-10-CM

## 2016-11-07 DIAGNOSIS — Z7189 Other specified counseling: Secondary | ICD-10-CM

## 2016-11-07 NOTE — Progress Notes (Signed)
[place]  [phone] [phone]     ID: Robin [name] DOB: [birth year]  MR#: [id number]  QVZ#:[id number]  Patient Care Team: Robin Huddle, MD as PCP - General (Internal Medicine) Robin Bookbinder, MD as Consulting Physician (General Surgery) Robin [name], Robin Dad, MD as Consulting Physician (Oncology) Robin [name], Robin Block, MD as Consulting Physician (Gynecology) Robin Sine, MD as Consulting Physician (Dermatology) Robin Breed Denice Bors, MD as Consulting Physician (Cardiology) Robin Fair, MD as Consulting Physician (Gastroenterology) OTHER MD:  CHIEF COMPLAINT: atypical lobular hyperplasia  CURRENT TREATMENT:  observation   HISTORY  OF CURRENT ILLNESS: from the original intake note:   Robin [name] herself noted a change in her left breast and brought it to Robin [name]'s attention. Hhe set her up for diagnostic left breast mammogram with tomography and left breast ultrasonography 07/17/2015. This found the breast density to be category C.there was a subtle area of distortion in the left breast upper outer quadrant which was not palpable on examination. Ultrasonography found no definite correlate. Tomosynthesis guided biopsy was performed 07/27/2015, and showed ([id number]) atypical lobular hyperplasia.  The patient was evaluated by surgery and after appropriate discussion excisional biopsy was performed 09/25/2015. This showed ([id number]) a complex sclerosing lesion with the usual ductal hyperplasia but also again atypical lobular hyperplasia.  The patient's subsequent history is as detailed below  INTERVAL HISTORY: Robin [name] Returns today for follow-up of her history of atypical lobular hyperplasia. The interval history is significant for her mother's passing  from small cell lung cancer. Robin [name] states she is doing better, but still cries at times. Pt was very close to her mother. She would like to be contacted by someone from grief  counseling. Pt also notes that after her mother died she experienced an anxiety attack. As previously advised she has lost a few pounds.       REVIEW OF SYSTEMS: Robin [name] does have Crohn's disease and now is being evaluated by Dr. Havery [name]. She denies unusual headaches, visual changes, nausea, vomiting, or dizziness. There has been no unusual cough, phlegm production, or pleurisy. This been no change in bowel or bladder habits. She denies unexplained fatigue or unexplained weight loss, bleeding, rash, or fever. A detailed review of systems was otherwise entirely negative.    PAST MEDICAL HISTORY: Past Medical History:  Diagnosis Date  . Arthritis   . Asthma    seasonal  . Atypical lobular hyperplasia (ALH) of left breast 07/2015  . Cancer ([place])    skin  . Crohn's disease ([place])    no problems  . Hypertension   . Hypothyroidism   . IUD (intrauterine device) in place 1983   Safety coil per Dr. Valeta [name] note attempted to retrieve but unable to do so  . Osteoporosis 12/2014    T score -2.6 stable from prior DEXA on Prolia  . Thyroid disease    Hypothyroid    PAST SURGICAL HISTORY: Past Surgical History:  Procedure Laterality Date  . Basal and squamous cell Ca excised    . COLONOSCOPY N/A 07/01/2013   Procedure: COLONOSCOPY;  Surgeon: Robin Fair, MD;  Location: [facility];  Service: Endoscopy;  Laterality: N/A;  . Fissure surg    . RADIOACTIVE SEED GUIDED EXCISIONAL BREAST BIOPSY Left 09/25/2015   Procedure: RADIOACTIVE SEED GUIDED EXCISIONAL BREAST BIOPSY;  Surgeon: Robin Bookbinder, MD;  Location: [facility];  Service: General;  Laterality: Left;  . TRIGGER FINGER RELEASE Right 02/01/2016   Procedure: RELEASE TRIGGER FINGER/A-1 PULLEY,  right thumb;  Surgeon: Robin Brod, MD;  Location: [facility];  Service: Orthopedics;  Laterality: Right;  FAB    FAMILY HISTORY Family History  Problem Relation Age of Onset  . Colon cancer Mother   . Heart  disease Mother        Atrial fibrillation  . Crohn's disease Mother   . Heart disease Maternal Grandmother   . Hypertension Maternal Grandmother   . Breast cancer Paternal Grandmother        age unknown  the patient has no information regarding her father. Her mother is 47 years old and has a history of colon polyps. There are no siblings. There is no history of cancer in the family.  GYNECOLOGIC HISTORY:  No LMP recorded. Patient is postmenopausal. Robin [name] does not recall when she had her first period. She is GX P0. 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 Her husband Robin [name] is in the roofing business.at home is just the 2 of them plus the patient's mother and 1 cat    ADVANCED DIRECTIVES: in place   HEALTH MAINTENANCE: Social History  Substance Use Topics  . Smoking status: Never Smoker  . Smokeless tobacco: Never Used  . Alcohol use 1.2 oz/week    2 Standard drinks or equivalent per week     Comment: Occasional     Colonoscopy: UTD/ [facility]  PAP: 2016/ [name]  Bone density: 2016/ "stable"   Allergies  Allergen Reactions  . Codeine Nausea Only    Needs something for nausea to go with pain pill  . Tetracyclines & Related Diarrhea  . Biaxin [Clarithromycin] Palpitations  . Clarithromycin Palpitations    Current Outpatient Prescriptions  Medication Sig Dispense Refill  . Biotin 5 MG CAPS Take 5 mg by mouth daily.     Marland Kitchen denosumab (PROLIA) 60 MG/ML SOLN injection Inject 60 mg into the skin every 6 (six) months. Administer in upper arm, thigh, or abdomen    . ranitidine (ZANTAC) 150 MG tablet Take 1 tablet (150 mg total) by mouth 2 (two) times daily. 30 tablet 1  . SUPREP BOWEL PREP KIT 17.5-3.13-1.6 GM/180ML SOLN Take 1 kit by mouth as directed. 354 mL 0  . SYNTHROID 75 MCG tablet Take 1 tablet (75 mcg total) by mouth daily before breakfast. 90 tablet 3  .  verapamil (CALAN-SR) 180 MG CR tablet Take 1 tablet (180 mg total) by mouth daily. 30 tablet 12   No current facility-administered medications for this visit.     OBJECTIVE: middle-aged white woman ho appears well  Vitals:   11/07/16 0929  BP: 122/85  Pulse: 93  Resp: 18  Temp: 98.4 F (36.9 C)  SpO2: 96%     Body mass index is 25.29 kg/m.    ECOG FS:0 - Asymptomatic  Sclerae unicteric, pupils round and equal Oropharynx clear and moist No cervical or supraclavicular adenopathy Lungs no rales or rhonchi Heart regular rate and rhythm Abd soft, nontender, positive bowel sounds MSK no focal spinal tenderness, no upper extremity lymphedema Neuro: nonfocal, well oriented, appropriate affect Breasts: The right breast is benign. The left breast is status post excisional biopsy. The cosmetic result is excellent. There is no mass in either breast and no skin or nipple changes of concern. Both axillae are benign. Skin: The patient is significantly tanned in all areas including breasts (she has a backdoor pool).  LAB RESULTS:  CMP  Component Value Date/Time   NA 141 05/10/2016 1321   K 3.2 (L) 05/10/2016 1321   CL 105 05/10/2016 1321   CO2 28 05/10/2016 1321   GLUCOSE 108 (H) 05/10/2016 1321   BUN 6 05/10/2016 1321   CREATININE 0.81 05/10/2016 1321   CALCIUM 9.0 05/10/2016 1321   PROT 7.1 03/15/2014 1131   ALBUMIN 3.7 03/15/2014 1131   AST 20 03/15/2014 1131   ALT 19 03/15/2014 1131   ALKPHOS 48 03/15/2014 1131   BILITOT 0.5 03/15/2014 1131   GFRNONAA >60 05/10/2016 1321   GFRAA >60 05/10/2016 1321    INo results found for: SPEP, UPEP  Lab Results  Component Value Date   WBC 8.5 05/10/2016   NEUTROABS 6.2 09/25/2015   HGB 11.7 (L) 05/10/2016   HCT 37.4 05/10/2016   MCV 85.8 05/10/2016   PLT 393 05/10/2016      Chemistry      Component Value Date/Time   NA 141 05/10/2016 1321   K 3.2 (L) 05/10/2016 1321   CL 105 05/10/2016 1321   CO2 28 05/10/2016 1321    BUN 6 05/10/2016 1321   CREATININE 0.81 05/10/2016 1321      Component Value Date/Time   CALCIUM 9.0 05/10/2016 1321   ALKPHOS 48 03/15/2014 1131   AST 20 03/15/2014 1131   ALT 19 03/15/2014 1131   BILITOT 0.5 03/15/2014 1131       No results found for: LABCA2  No components found for: LABCA125  No results for input(s): INR in the last 168 hours.  Urinalysis    Component Value Date/Time   COLORURINE YELLOW 08/16/2015 0006   APPEARANCEUR CLEAR 08/16/2015 0006   LABSPEC 1.022 08/16/2015 0006   PHURINE 5.5 08/16/2015 0006   GLUCOSEU NEGATIVE 08/16/2015 0006   HGBUR LARGE (A) 08/16/2015 0006   BILIRUBINUR NEGATIVE 08/16/2015 0006   KETONESUR NEGATIVE 08/16/2015 0006   PROTEINUR NEGATIVE 08/16/2015 0006   UROBILINOGEN 0.2 12/16/2013 0854   NITRITE NEGATIVE 08/16/2015 0006   LEUKOCYTESUR MODERATE (A) 08/16/2015 0006     STUDIES: Bilateral diagnostic mammography with tomography at the [facility] 05/29/2016 found the breast density to be category B. Aside from postoperative changes there was a small circumscribed mass in the right breast, which was not palpable. Ultrasonography confirmed a simple cyst at the 2:00 location of the right breast 5 cm from the nipple, measuring 0.3 cm.this was felt to be benign and screening mammography in 1 year was recommended.  ELIGIBLE FOR AVAILABLE RESEARCH PROTOCOL: no   ASSESSMENT: 61 y.o. [place], [place] woman Status post left breast upper outer quadrant biopsy 07/27/2015 showing atypical lobular hyperplasia  (1) left lumpectomy 09/25/2015 showed a complex sclerosing lesion and atypical lobular hyperplasia.  PLAN: Amahia is a year out from her lumpectomy, with no suspicious findings.  She would like to be followed here on a yearly basis chiefly for reassurance. She will have her next mammogram in April. Accordingly I will see her again in May 2019 and then yearly thereafter.  We discussed her mother's passing. I think she would benefit  from grief counseling and we have placed that referral through hospice  She knows to call for any other issues that may develop before her next visit here.  , Robin Dad, MD  11/07/16 9:55 AM Medical Oncology and Hematology [facility] [address] [place], [place] [address] Tel. [phone]    Fax. [phone]  This document serves as a record of services personally performed by Robin [name],  , MD. It was created on her behalf by Margit Banda, a trained medical scribe. The creation of this record is based on the scribe's personal observations and the provider's statements to them. This document has been checked and approved by the attending provider.

## 2016-11-11 ENCOUNTER — Ambulatory Visit (HOSPITAL_COMMUNITY)
Admission: RE | Admit: 2016-11-11 | Discharge: 2016-11-11 | Disposition: A | Discharge status: Home / Self Care | Payer: Managed Care, Other (non HMO) | Source: Ambulatory Visit | Attending: Gastroenterology | Admitting: Gastroenterology

## 2016-11-11 DIAGNOSIS — K7689 Other specified diseases of liver: Secondary | ICD-10-CM | POA: Diagnosis not present

## 2016-11-11 DIAGNOSIS — R933 Abnormal findings on diagnostic imaging of other parts of digestive tract: Secondary | ICD-10-CM

## 2016-11-11 DIAGNOSIS — K838 Other specified diseases of biliary tract: Secondary | ICD-10-CM | POA: Diagnosis not present

## 2016-11-12 ENCOUNTER — Other Ambulatory Visit: Payer: Self-pay

## 2016-11-12 DIAGNOSIS — R945 Abnormal results of liver function studies: Principal | ICD-10-CM

## 2016-11-12 DIAGNOSIS — R7989 Other specified abnormal findings of blood chemistry: Secondary | ICD-10-CM

## 2016-11-19 ENCOUNTER — Telehealth: Payer: Self-pay | Admitting: Gynecology

## 2016-11-19 NOTE — Telephone Encounter (Signed)
Closed encounter due to second encounter not needed

## 2016-11-19 NOTE — Telephone Encounter (Signed)
CALLED [employer] for benefits not received from 11/05/16 they will re fax.

## 2016-11-20 ENCOUNTER — Telehealth: Payer: Self-pay | Admitting: Gastroenterology

## 2016-11-20 NOTE — Telephone Encounter (Signed)
Robin [name] can you update me when the patient is able to talk? thanks

## 2016-11-20 NOTE — Telephone Encounter (Signed)
Okay. If her pain is bothering her or getting worse, a CT scan of the abdomen / pelvis would be okay to do to help restage her Crohn's along with her colonoscopy. We can schedule it if she wants to proceed. If mild and she wants to wait for colonoscopy, that's fine too

## 2016-11-20 NOTE — Telephone Encounter (Signed)
Patient is returning call. She states she is at work and cannot answer in the moment.

## 2016-11-20 NOTE — Telephone Encounter (Signed)
Spoke to patient, she is still having intermittent left sided pain. She is still having bowel movements, denies any blood. She is scheduled for endocolon on 12/31/16. Patient is just anxious after having lost her mother recently, and new GI provider. Patient reassured, let her know that if I needed to I would call her back. Since she is at work she said it would be okay to leave a message if needed.

## 2016-11-20 NOTE — Telephone Encounter (Signed)
Called patient back, no answer and had to leave message to contact office.

## 2016-11-21 NOTE — Telephone Encounter (Signed)
Left message for patient to call office.  

## 2016-11-21 NOTE — Telephone Encounter (Signed)
Spoke to patient, she would like to wait on CT and just proceed with scheduled procedures. She understands that if pain worsens she will contact our office.

## 2016-11-26 ENCOUNTER — Other Ambulatory Visit: Payer: Self-pay | Admitting: Gastroenterology

## 2016-12-03 NOTE — Telephone Encounter (Addendum)
Received three phone calls from pt., benefits at [place] not corrected due to change of policy, new benefit received today. Calcium  9.0 05/10/16, complete 10/17  Appointment 12/2016 complete with TF. PA needed for insurance. Prolia subject to 30% allowable [employer] [id number] $1353.60 96372  $33.80  co pay approx,$416.00  Patient can apply for Prolia benefit card, OOPM $5831($674 met), with OV also co pay $60.  Talked with pt at 5 30 she will call and check on her benefits also. I did check on online PA no results.

## 2016-12-09 ENCOUNTER — Encounter: Payer: Self-pay | Admitting: Gynecology

## 2016-12-09 NOTE — Telephone Encounter (Signed)
Received PA from [facility]  Ref # [id number]  PA ref # [id number] One syringe q 6 months, valid 12/03/2016  Thru 12/03/2017, Prolia card applied for per pt. Appointment made 12/12/16 at 9 am. Pt to bring Prolia card information and sign financial statement for Prolia. Called pt left VM

## 2016-12-12 ENCOUNTER — Ambulatory Visit (INDEPENDENT_AMBULATORY_CARE_PROVIDER_SITE_OTHER): Payer: Managed Care, Other (non HMO) | Admitting: Gynecology

## 2016-12-12 DIAGNOSIS — M810 Age-related osteoporosis without current pathological fracture: Secondary | ICD-10-CM

## 2016-12-12 MED ORDER — DENOSUMAB 60 MG/ML ~~LOC~~ SOLN
60.0000 mg | Freq: Once | SUBCUTANEOUS | Status: AC
  Administered 2016-12-12: 60 mg via SUBCUTANEOUS

## 2016-12-17 ENCOUNTER — Encounter: Payer: Self-pay | Admitting: Gastroenterology

## 2016-12-17 NOTE — Telephone Encounter (Signed)
Prolia given 12/12/16  Next injection due after 06/13/17

## 2016-12-23 ENCOUNTER — Telehealth: Payer: Self-pay | Admitting: Gastroenterology

## 2016-12-23 ENCOUNTER — Other Ambulatory Visit: Payer: Self-pay

## 2016-12-23 MED ORDER — DICYCLOMINE HCL 10 MG PO CAPS: 10.0000 mg | ORAL_CAPSULE | Freq: Three times a day (TID) | ORAL | 1 refills | Status: DC | PRN

## 2016-12-23 NOTE — Telephone Encounter (Signed)
She previously reported her symptoms of Crohns were mostly diarrhea , abdominal pains, urgency of stools. If she has noisy bowels only (borborygmi), I would not recommend steroids, and in general wish to avoid any steroids. We can try her on bentyl 52m q 8 hours PRN if she wishes to try something for this. Thanks

## 2016-12-23 NOTE — Telephone Encounter (Signed)
I am sorry to hear this. Not sure how much of this cost she needs to pay / how much her insurance will cover it.  Unfortunately there is no other good option for surveillance of her colon to rule out any pre-cancerous changes / evaluate colonic mucosa. Hopefull she can reschedule at a time that works better for her, otherwise not sure if she can get financial assistance in any other way. Thanks

## 2016-12-23 NOTE — Telephone Encounter (Signed)
Patient is wondering if there is some medication she can take in the time being, she questioned a steroid. She states her intestinal tract  will make a lot of noise at various times of the day and is very embarrassing while at work. She will call back later to schedule procedure for January.

## 2016-12-23 NOTE — Telephone Encounter (Signed)
Routed to Dr. Havery Moros. Patient cancelled her endocolon for 12/31/16 at [facility].

## 2016-12-26 ENCOUNTER — Ambulatory Visit (INDEPENDENT_AMBULATORY_CARE_PROVIDER_SITE_OTHER): Payer: Managed Care, Other (non HMO) | Admitting: Gynecology

## 2016-12-26 ENCOUNTER — Encounter: Payer: Self-pay | Admitting: Gynecology

## 2016-12-26 VITALS — BP 120/70 | Ht 66.0 in | Wt 148.0 lb

## 2016-12-26 DIAGNOSIS — Z01411 Encounter for gynecological examination (general) (routine) with abnormal findings: Secondary | ICD-10-CM | POA: Diagnosis not present

## 2016-12-26 DIAGNOSIS — M810 Age-related osteoporosis without current pathological fracture: Secondary | ICD-10-CM

## 2016-12-26 DIAGNOSIS — N952 Postmenopausal atrophic vaginitis: Secondary | ICD-10-CM

## 2016-12-26 NOTE — Progress Notes (Signed)
    Robin [name] 02-Jan-1956 [id number]        61 y.o.  G0P0 for annual gynecologic exam.    Past medical history,surgical history, problem list, medications, allergies, family history and social history were all reviewed and documented as reviewed in the EPIC chart.  ROS:  Performed with pertinent positives and negatives included in the history, assessment and plan.   Additional significant findings : None   Exam: Robin [name] assistant Vitals:   12/26/16 0903  BP: 120/70  Weight: 148 lb (67.1 kg)  Height: 5' 6"  (1.676 m)   Body mass index is 23.89 kg/m.  General appearance:  Normal affect, orientation and appearance. Skin: Grossly normal HEENT: Without gross lesions.  No cervical or supraclavicular adenopathy. Thyroid normal.  Lungs:  Clear without wheezing, rales or rhonchi Cardiac: RR, without RMG Abdominal:  Soft, nontender, without masses, guarding, rebound, organomegaly or hernia Breasts:  Examined lying and sitting without masses, retractions, discharge or axillary adenopathy. Pelvic:  Ext, BUS, Vagina: With atrophic changes  Cervix: With atrophic changes  Uterus: Anteverted, normal size, shape and contour, midline and mobile nontender   Adnexa: Without masses or tenderness    Anus and perineum: Normal   Rectovaginal: Normal sphincter tone without palpated masses or tenderness.    Assessment/Plan:  61 y.o. G0P0 female for annual gynecologic exam.   1. Osteoporosis.  DEXA 12/2014 T score -2.6.  Currently on Prolia for approximately 5 years.  Reviewed issues with Prolia to include discontinuing with possible accelerated bone loss requiring additional treatment with other medications such as bisphosphonates versus continuing Prolia now.  Risks of prolonged anti-absorptive medication use reviewed.  At this point we both feel comfortable continuing this year.  We will schedule DEXA now at a 2-year interval to monitor therapy. 2. Postmenopausal/atrophic genital changes.  No  significant hot flushes, night sweats, vaginal dryness or any vaginal bleeding.  Continue to monitor and report any issues or bleeding. 3. History of safety coil 1983.  Dr. Cherylann Banas unable to remove it.  Unclear location whether still intrauterine versus extrauterine.  Issues of trying to locate it now ultrasound or x-rays questioning what we would do regardless if we found that as she is doing well with no symptoms versus the risks of surgery trying to remove it reviewed.  After lengthy discussion we both agree to no further intervention at this point as far as studies or trying to remove it. 4. Mammography 05/2016.  Continue with annual mammography when due.  Breast exam normal today.  SBE monthly reviewed. 5. Pap smear/HPV 2015.  No Pap smear done today.  No history of significant abnormal Pap smears.  Plan repeat Pap smear at 5-year interval per current screening guidelines. 6. Colonoscopy 2015.  Repeat at their recommended interval. 7. Health maintenance.  No routine lab work done as patient does this elsewhere.  Follow-up 1 year, sooner as needed.   Anastasio Auerbach MD, 9:38 AM 12/26/2016

## 2016-12-26 NOTE — Patient Instructions (Signed)
Follow-up for the bone density as scheduled.  Follow-up in 1 year for annual exam, sooner if any issues.

## 2016-12-31 ENCOUNTER — Encounter: Payer: Managed Care, Other (non HMO) | Admitting: Gastroenterology

## 2017-01-23 ENCOUNTER — Ambulatory Visit (INDEPENDENT_AMBULATORY_CARE_PROVIDER_SITE_OTHER): Payer: Managed Care, Other (non HMO)

## 2017-01-23 ENCOUNTER — Other Ambulatory Visit: Payer: Self-pay | Admitting: Gynecology

## 2017-01-23 DIAGNOSIS — M810 Age-related osteoporosis without current pathological fracture: Secondary | ICD-10-CM

## 2017-01-24 ENCOUNTER — Telehealth: Payer: Self-pay | Admitting: Gynecology

## 2017-01-24 ENCOUNTER — Encounter: Payer: Self-pay | Admitting: Gynecology

## 2017-01-24 NOTE — Telephone Encounter (Signed)
Pt informed

## 2017-01-24 NOTE — Telephone Encounter (Signed)
Tell patient her bone density is stable.  We will continue on Prolia and repeat the bone density in 2 years.

## 2017-01-24 NOTE — Telephone Encounter (Signed)
Left message for pt to call.

## 2017-02-03 ENCOUNTER — Other Ambulatory Visit: Payer: Self-pay | Admitting: Gastroenterology

## 2017-03-21 ENCOUNTER — Encounter: Payer: Self-pay | Admitting: Gastroenterology

## 2017-03-27 ENCOUNTER — Other Ambulatory Visit: Payer: Self-pay | Admitting: *Deleted

## 2017-04-17 ENCOUNTER — Ambulatory Visit (AMBULATORY_SURGERY_CENTER): Payer: Self-pay

## 2017-04-17 ENCOUNTER — Other Ambulatory Visit: Payer: Self-pay

## 2017-04-17 VITALS — Ht 65.5 in | Wt 155.8 lb

## 2017-04-17 DIAGNOSIS — K219 Gastro-esophageal reflux disease without esophagitis: Secondary | ICD-10-CM

## 2017-04-17 DIAGNOSIS — K50818 Crohn's disease of both small and large intestine with other complication: Secondary | ICD-10-CM

## 2017-04-17 MED ORDER — NA SULFATE-K SULFATE-MG SULF 17.5-3.13-1.6 GM/177ML PO SOLN: 1.0000 | Freq: Once | ORAL | 0 refills | Status: AC

## 2017-04-17 NOTE — Progress Notes (Signed)
Denies allergies to eggs or soy products. Denies complication of anesthesia or sedation. Denies use of weight loss medication. Denies use of O2.   Emmi instructions declined.  

## 2017-04-25 ENCOUNTER — Encounter: Payer: Self-pay | Admitting: Gynecology

## 2017-04-25 ENCOUNTER — Telehealth: Payer: Self-pay | Admitting: *Deleted

## 2017-04-25 NOTE — Telephone Encounter (Signed)
Prolia [employer] policy terminated 4/71/58

## 2017-04-25 NOTE — Telephone Encounter (Signed)
Prolia insurance verification has been sent awaiting Summary of benefits

## 2017-04-25 NOTE — Telephone Encounter (Addendum)
Left message for pt to return my call. What insurance do we need to use?Pt returned my call she has [place] now.  Will bring current card so we can make a copy of it.

## 2017-04-28 NOTE — Telephone Encounter (Addendum)
Deductible Amount Met  OOP MAX$6000 ($70mt)  Annual exam 12/26/16 TF   Calcium 9.7     Date 05/05/17  Upcoming dental procedures   Prior Authorization needed YES reference #[id number]   PA approved 04/30/2017 to 04/30/2018    Pt estimated Cost $0         Coverage Details:

## 2017-05-01 ENCOUNTER — Ambulatory Visit (AMBULATORY_SURGERY_CENTER): Payer: BLUE CROSS/BLUE SHIELD | Admitting: Gastroenterology

## 2017-05-01 ENCOUNTER — Encounter: Payer: Self-pay | Admitting: Gastroenterology

## 2017-05-01 VITALS — BP 125/81 | HR 93 | Temp 98.6°F | Resp 14 | Ht 66.0 in | Wt 148.0 lb

## 2017-05-01 DIAGNOSIS — K50818 Crohn's disease of both small and large intestine with other complication: Secondary | ICD-10-CM | POA: Diagnosis not present

## 2017-05-01 DIAGNOSIS — D181 Lymphangioma, any site: Secondary | ICD-10-CM | POA: Diagnosis not present

## 2017-05-01 DIAGNOSIS — K219 Gastro-esophageal reflux disease without esophagitis: Secondary | ICD-10-CM

## 2017-05-01 DIAGNOSIS — K5190 Ulcerative colitis, unspecified, without complications: Secondary | ICD-10-CM | POA: Diagnosis not present

## 2017-05-01 DIAGNOSIS — K529 Noninfective gastroenteritis and colitis, unspecified: Secondary | ICD-10-CM | POA: Diagnosis not present

## 2017-05-01 DIAGNOSIS — K5090 Crohn's disease, unspecified, without complications: Secondary | ICD-10-CM | POA: Diagnosis not present

## 2017-05-01 MED ORDER — SODIUM CHLORIDE 0.9 % IV SOLN
500.0000 mL | Freq: Once | INTRAVENOUS | Status: DC
Start: 2017-05-01 — End: 2017-11-13

## 2017-05-01 NOTE — Patient Instructions (Signed)
Impression/recommendations:  Endoscopy: Hiatal hernia  Colonoscopy: Crohn's disease  YOU HAD AN ENDOSCOPIC PROCEDURE TODAY AT [facility]:   Refer to the procedure report that was given to you for any specific questions about what was found during the examination.  If the procedure report does not answer your questions, please call your gastroenterologist to clarify.  If you requested that your care partner not be given the details of your procedure findings, then the procedure report has been included in a sealed envelope for you to review at your convenience later.  YOU SHOULD EXPECT: Some feelings of bloating in the abdomen. Passage of more gas than usual.  Walking can help get rid of the air that was put into your GI tract during the procedure and reduce the bloating. If you had a lower endoscopy (such as a colonoscopy or flexible sigmoidoscopy) you may notice spotting of blood in your stool or on the toilet paper. If you underwent a bowel prep for your procedure, you may not have a normal bowel movement for a few days.  Please Note:  You might notice some irritation and congestion in your nose or some drainage.  This is from the oxygen used during your procedure.  There is no need for concern and it should clear up in a day or so.  SYMPTOMS TO REPORT IMMEDIATELY:   Following lower endoscopy (colonoscopy or flexible sigmoidoscopy):  Excessive amounts of blood in the stool  Significant tenderness or worsening of abdominal pains  Swelling of the abdomen that is new, acute  Fever of 100F or higher   Following upper endoscopy (EGD)  Vomiting of blood or coffee ground material  New chest pain or pain under the shoulder blades  Painful or persistently difficult swallowing  New shortness of breath  Fever of 100F or higher  Black, tarry-looking stools  For urgent or emergent issues, a gastroenterologist can be reached at any hour by calling [phone].   DIET:   We do recommend a small meal at first, but then you may proceed to your regular diet.  Drink plenty of fluids but you should avoid alcoholic beverages for 24 hours.  ACTIVITY:  You should plan to take it easy for the rest of today and you should NOT DRIVE or use heavy machinery until tomorrow (because of the sedation medicines used during the test).    FOLLOW UP: Our staff will call the number listed on your records the next business day following your procedure to check on you and address any questions or concerns that you may have regarding the information given to you following your procedure. If we do not reach you, we will leave a message.  However, if you are feeling well and you are not experiencing any problems, there is no need to return our call.  We will assume that you have returned to your regular daily activities without incident.  If any biopsies were taken you will be contacted by phone or by letter within the next 1-3 weeks.  Please call us at [phone] if you have not heard about the biopsies in 3 weeks.    SIGNATURES/CONFIDENTIALITY: You and/or your care partner have signed paperwork which will be entered into your electronic medical record.  These signatures attest to the fact that that the information above on your After Visit Summary has been reviewed and is understood.  Full responsibility of the confidentiality of this discharge information lies with you and/or your care-partner.

## 2017-05-01 NOTE — Progress Notes (Signed)
Pt's states no medical or surgical changes since previsit or office visit. 

## 2017-05-01 NOTE — Progress Notes (Signed)
Called to room to assist during endoscopic procedure.  Patient ID and intended procedure confirmed with present staff. Received instructions for my participation in the procedure from the performing physician.  

## 2017-05-01 NOTE — Progress Notes (Signed)
Report given to PACU, vss 

## 2017-05-01 NOTE — Op Note (Signed)
[facility] [place] Patient Name: Robin [name] Procedure Date: 05/01/2017 7:55 AM MRN: [id number] Endoscopist: Remo Lipps P. Khalif Stender MD, MD Age: 62 Referring MD:  Date of Birth: Apr 15, 1955 Gender: Female Account #: [id number] Procedure:                Upper GI endoscopy Indications:              Heartburn, history of Crohn's disease Medicines:                Monitored Anesthesia Care Procedure:                Pre-Anesthesia Assessment:                           - Prior to the procedure, a History and Physical                            was performed, and patient medications and                            allergies were reviewed. The patient's tolerance of                            previous anesthesia was also reviewed. The risks                            and benefits of the procedure and the sedation                            options and risks were discussed with the patient.                            All questions were answered, and informed consent                            was obtained. Prior Anticoagulants: The patient has                            taken no previous anticoagulant or antiplatelet                            agents. ASA Grade Assessment: II - A patient with                            mild systemic disease. After reviewing the risks                            and benefits, the patient was deemed in                            satisfactory condition to undergo the procedure.                           After obtaining informed consent, the endoscope was  passed under direct vision. Throughout the                            procedure, the patient's blood pressure, pulse, and                            oxygen saturations were monitored continuously. The                            Endoscope was introduced through the mouth, and                            advanced to the second part of duodenum. The upper                            GI endoscopy  was accomplished without difficulty.                            The patient tolerated the procedure well. Scope In: Scope Out: Findings:                 Esophagogastric landmarks were identified: the                            Z-line was found at 39 cm, the gastroesophageal                            junction was found at 39 cm and the upper extent of                            the gastric folds was found at 40 cm from the                            incisors.                           A 1 cm hiatal hernia was present.                           The exam of the esophagus was otherwise normal. No                            evidence of Barrett's esophagus.                           The entire examined stomach was normal.                           A benign lymphangiectasia was present in the third                            portion of the duodenum. Biopsies were taken with a  cold forceps for histology.                           The exam of the duodenum was otherwise normal. Complications:            No immediate complications. Estimated blood loss:                            Minimal. Estimated Blood Loss:     Estimated blood loss was minimal. Impression:               - Esophagogastric landmarks identified.                           - 1 cm hiatal hernia.                           - Normal esophagus                           - Normal stomach.                           - Duodenal mucosal lymphangiectasia.                           No evidence of upper tract Crohn's disease Recommendation:           - Patient has a contact number available for                            emergencies. The signs and symptoms of potential                            delayed complications were discussed with the                            patient. Return to normal activities tomorrow.                            Written discharge instructions were provided to the                             patient.                           - Resume previous diet.                           - Continue present medications.                           - Await pathology results. Remo Lipps P. Tamilyn Lupien MD, MD 05/01/2017 8:33:28 AM This report has been signed electronically.

## 2017-05-01 NOTE — Op Note (Signed)
[place] Patient Name: Robin [name] Procedure Date: 05/01/2017 7:55 AM MRN: [id number] Endoscopist: Remo Lipps P. Owen Pratte MD, MD Age: 62 Referring MD:  Date of Birth: 08/13/1955 Gender: Female Account #: [id number] Procedure:                Colonoscopy Indications:              Follow-up of Crohn's disease of the colon,                            currently not on any therapy, no therapy other than                            prednisone historically Medicines:                Monitored Anesthesia Care Procedure:                Pre-Anesthesia Assessment:                           - Prior to the procedure, a History and Physical                            was performed, and patient medications and                            allergies were reviewed. The patient's tolerance of                            previous anesthesia was also reviewed. The risks                            and benefits of the procedure and the sedation                            options and risks were discussed with the patient.                            All questions were answered, and informed consent                            was obtained. Prior Anticoagulants: The patient has                            taken no previous anticoagulant or antiplatelet                            agents. ASA Grade Assessment: II - A patient with                            mild systemic disease. After reviewing the risks                            and benefits, the patient was deemed in  satisfactory condition to undergo the procedure.                           After obtaining informed consent, the colonoscope                            was passed under direct vision. Throughout the                            procedure, the patient's blood pressure, pulse, and                            oxygen saturations were monitored continuously. The                            Model PCF-[id number] scope  was introduced                            through the anus with the intention of advancing to                            the cecum. The scope was advanced to the transverse                            colon before the procedure was aborted. Medications                            were given. The colonoscopy was performed without                            difficulty. The patient tolerated the procedure                            well. The quality of the bowel preparation was                            adequate. The rectum was photographed. Scope In: 8:08:57 AM Scope Out: 8:20:51 AM Total Procedure Duration: 0 hours 11 minutes 54 seconds  Findings:                 The perianal and digital rectal examinations were                            normal.                           A benign-appearing, intrinsic moderate stenosis                            measuring roughly 1-2 cm (in length) was found in                            the sigmoid colon, around 30-35cm from the anal  verge. It could not be traversed with the pediatric                            colonoscope, which was removed and replaced with an                            upper endoscope. It was able to be traversed with                            the upper endoscope. No malignant appearing tissue                            was noted. Biopsies were taken with a cold forceps                            for histology.                           Inflammation characterized by erosions, erythema,                            friability, granularity and deep ulcerations was                            found in the patchy distribrution along the left                            and transverse colon. This was severe. Multiple                            areas of luminal narrowing with deep ulcerations                            and adherent old blood were noted. The right colon                            could not be reached  using the upper endoscope,                            extent reached was somewhere in the transverse                            colon. The rectal inflammation was only mild.                            Biopsies were taken with a cold forceps for                            histology.                           Anal papilla(e) were hypertrophied. Complications:            No immediate complications. Estimated blood loss:  Minimal. Estimated Blood Loss:     Estimated blood loss was minimal. Impression:               - Crohn's stricture in the sigmoid colon. Biopsied.                           - Crohn's disease with colonic involvement. Severe                            patchy inflammation was found. This was severe in                            the left and transverse colon. Biopsied.                           - Anal papilla(e) were hypertrophied. Recommendation:           - Patient has a contact number available for                            emergencies. The signs and symptoms of potential                            delayed complications were discussed with the                            patient. Return to normal activities tomorrow.                            Written discharge instructions were provided to the                            patient.                           - Resume previous diet.                           - Continue present medications.                           - Await pathology results.                           - Recommend CT enterography to further stage disease                           - The patient warrants biologic therapy. I will                            discuss options with her. Remo Lipps P. Geanette Buonocore MD, MD 05/01/2017 8:30:19 AM This report has been signed electronically.

## 2017-05-02 ENCOUNTER — Other Ambulatory Visit: Payer: Self-pay

## 2017-05-02 ENCOUNTER — Telehealth: Payer: Self-pay | Admitting: *Deleted

## 2017-05-02 ENCOUNTER — Telehealth: Payer: Self-pay

## 2017-05-02 DIAGNOSIS — K50919 Crohn's disease, unspecified, with unspecified complications: Secondary | ICD-10-CM

## 2017-05-02 NOTE — Telephone Encounter (Signed)
Spoke to patient, let her to know to expect a call from [facility] to set up  CT enterography, she can also do the blood work there. Asked patient to call back if she has not heard from them by next Wednesday to let me know.

## 2017-05-02 NOTE — Telephone Encounter (Signed)
  Follow up Call-  Call back number 05/01/2017  Post procedure Call Back phone  # [phone]  Permission to leave phone message Yes  Some recent data might be hidden     Patient questions:  Do you have a fever, pain , or abdominal swelling? No. Pain Score  0 *  Have you tolerated food without any problems? Yes.    Have you been able to return to your normal activities? Yes.    Do you have any questions about your discharge instructions: Diet   No. Medications  No. Follow up visit  No.  Do you have questions or concerns about your Care? Yes.    Actions: * If pain score is 4 or above: No action needed, pain <4.

## 2017-05-02 NOTE — Telephone Encounter (Signed)
-----   Message from Yetta Flock, MD sent at 05/01/2017 12:13 PM EST ----- Regarding: follow up tests Hi Almyra Free, I was hoping you could help contact this patient and help coordinate the following for her Crohn's disease:  - CT enterography with contrast - CBC, CMET, vitamin D, quantiferon gold, hepatitis B surface antigen, hepatitis B core antibody, hepatitis C antibody  Thanks Dr. Loni Muse

## 2017-05-05 ENCOUNTER — Other Ambulatory Visit (INDEPENDENT_AMBULATORY_CARE_PROVIDER_SITE_OTHER): Payer: BLUE CROSS/BLUE SHIELD

## 2017-05-05 DIAGNOSIS — K50919 Crohn's disease, unspecified, with unspecified complications: Secondary | ICD-10-CM | POA: Diagnosis not present

## 2017-05-05 DIAGNOSIS — R7989 Other specified abnormal findings of blood chemistry: Secondary | ICD-10-CM

## 2017-05-05 DIAGNOSIS — R945 Abnormal results of liver function studies: Secondary | ICD-10-CM | POA: Diagnosis not present

## 2017-05-05 LAB — COMPREHENSIVE METABOLIC PANEL
ALBUMIN: 3.8 g/dL (ref 3.5–5.2)
ALT: 11 U/L (ref 0–35)
AST: 15 U/L (ref 0–37)
Alkaline Phosphatase: 50 U/L (ref 39–117)
BUN: 9 mg/dL (ref 6–23)
CHLORIDE: 102 meq/L (ref 96–112)
CO2: 29 meq/L (ref 19–32)
Calcium: 9.7 mg/dL (ref 8.4–10.5)
Creatinine, Ser: 0.74 mg/dL (ref 0.40–1.20)
GFR: 84.54 mL/min (ref >60.00)
GLUCOSE: 105 mg/dL — AB (ref 70–99)
POTASSIUM: 3.8 meq/L (ref 3.5–5.1)
SODIUM: 139 meq/L (ref 135–145)
Total Bilirubin: 0.3 mg/dL (ref 0.2–1.2)
Total Protein: 7.2 g/dL (ref 6.0–8.3)

## 2017-05-05 LAB — CBC WITH DIFFERENTIAL/PLATELET
Basophils Absolute: 0.1 10*3/uL (ref 0.0–0.1)
Basophils Relative: 0.8 % (ref 0.0–3.0)
EOS PCT: 2 % (ref 0.0–5.0)
Eosinophils Absolute: 0.2 10*3/uL (ref 0.0–0.7)
HCT: 32.7 % — ABNORMAL LOW (ref 36.0–46.0)
HEMOGLOBIN: 10.7 g/dL — AB (ref 12.0–15.0)
Lymphocytes Relative: 22.8 % (ref 12.0–46.0)
Lymphs Abs: 2.6 10*3/uL (ref 0.7–4.0)
MCHC: 32.8 g/dL (ref 30.0–36.0)
MCV: 80.3 fl (ref 78.0–100.0)
MONO ABS: 0.8 10*3/uL (ref 0.1–1.0)
Monocytes Relative: 7.6 % (ref 3.0–12.0)
NEUTROS ABS: 7.5 10*3/uL (ref 1.4–7.7)
Neutrophils Relative %: 66.8 % (ref 43.0–77.0)
Platelets: 555 10*3/uL — ABNORMAL HIGH (ref 150.0–400.0)
RBC: 4.08 Mil/uL (ref 3.87–5.11)
RDW: 14.5 % (ref 11.5–15.5)
WBC: 11.2 10*3/uL — AB (ref 4.0–10.5)

## 2017-05-05 LAB — HEPATIC FUNCTION PANEL
ALT: 11 U/L (ref 0–35)
AST: 15 U/L (ref 0–37)
Albumin: 3.8 g/dL (ref 3.5–5.2)
Alkaline Phosphatase: 50 U/L (ref 39–117)
BILIRUBIN TOTAL: 0.3 mg/dL (ref 0.2–1.2)
Bilirubin, Direct: 0 mg/dL (ref 0.0–0.3)
TOTAL PROTEIN: 7.2 g/dL (ref 6.0–8.3)

## 2017-05-05 NOTE — Addendum Note (Signed)
Addended by: Trenda Moots on: 03/15/8675 10:34 AM   Modules accepted: Orders

## 2017-05-05 NOTE — Addendum Note (Signed)
Addended by: Trenda Moots on: 06/03/9276 10:34 AM   Modules accepted: Orders

## 2017-05-05 NOTE — Addendum Note (Signed)
Addended by: Trenda Moots on: 11/10/411 10:34 AM   Modules accepted: Orders

## 2017-05-05 NOTE — Addendum Note (Signed)
Addended by: Trenda Moots on: 10/04/9831 10:34 AM   Modules accepted: Orders

## 2017-05-05 NOTE — Telephone Encounter (Signed)
Pt is having up coming blood work with GI. Soon as labs come back will schedule Prolia injection

## 2017-05-05 NOTE — Addendum Note (Signed)
Addended by: Trenda Moots on: 7/94/9971 10:34 AM   Modules accepted: Orders

## 2017-05-07 LAB — HEPATITIS B CORE ANTIBODY, IGM: Hep B C IgM: NONREACTIVE

## 2017-05-07 LAB — HEPATITIS C ANTIBODY
Hepatitis C Ab: NONREACTIVE
SIGNAL TO CUT-OFF: 0.03 (ref <1.00)

## 2017-05-07 LAB — QUANTIFERON-TB GOLD PLUS
Mitogen-NIL: >10 IU/mL
NIL: 0.07 [IU]/mL
QUANTIFERON-TB GOLD PLUS: NEGATIVE
TB1-NIL: 0.02 IU/mL
TB2-NIL: 0.01 IU/mL

## 2017-05-07 LAB — VITAMIN D 25 HYDROXY (VIT D DEFICIENCY, FRACTURES): Vit D, 25-Hydroxy: 39 ng/mL (ref 30–100)

## 2017-05-07 LAB — HEPATITIS B SURFACE ANTIGEN: HEP B S AG: NONREACTIVE

## 2017-05-08 ENCOUNTER — Ambulatory Visit (INDEPENDENT_AMBULATORY_CARE_PROVIDER_SITE_OTHER)
Admission: RE | Admit: 2017-05-08 | Discharge: 2017-05-08 | Disposition: A | Discharge status: Home / Self Care | Payer: BLUE CROSS/BLUE SHIELD | Source: Ambulatory Visit | Attending: Gastroenterology | Admitting: Gastroenterology

## 2017-05-08 DIAGNOSIS — K50919 Crohn's disease, unspecified, with unspecified complications: Secondary | ICD-10-CM | POA: Diagnosis not present

## 2017-05-08 DIAGNOSIS — K5090 Crohn's disease, unspecified, without complications: Secondary | ICD-10-CM | POA: Diagnosis not present

## 2017-05-08 MED ORDER — IOPAMIDOL (ISOVUE-300) INJECTION 61%
100.0000 mL | Freq: Once | INTRAVENOUS | Status: AC | PRN
  Administered 2017-05-08: 100 mL via INTRAVENOUS

## 2017-05-12 ENCOUNTER — Telehealth: Payer: Self-pay

## 2017-05-12 NOTE — Telephone Encounter (Signed)
Spoke to patient at great length about Humira. She is understands and wants to do something to help with her Crohn's, is agreeable to the Humira. Faxed in information to [employer] and [employer].

## 2017-05-13 NOTE — Telephone Encounter (Signed)
Called to schedule Prolia

## 2017-05-13 NOTE — Telephone Encounter (Signed)
Appt 06/16/17

## 2017-05-15 ENCOUNTER — Telehealth: Payer: Self-pay | Admitting: Gastroenterology

## 2017-05-15 NOTE — Telephone Encounter (Signed)
Thanks Robin [name] does slightly increase the risk for non-melanoma skin cancers like she has had in the past, but it is not a contraindication to using it. She would have to follow with her Dermatologist, at least one yearly while on this regimen and likely shortly after she starts the regimen.  Can you confirm with her, that she does not have history breast cancer? Want to confirm this from reviewing her chart.  I don't see anything else on CT scan to account for her pain there. Given the location of where her Crohn's is active, my suspicion is that it is the most likely cause for her pain. Thanks

## 2017-05-15 NOTE — Telephone Encounter (Signed)
Patient aware that doctor is out of town and questions will be addressed next week.  1. Patient has a history of basal cell and squamous cell carcinoma, wants to know if she is at more risks for these types of cancer if she starts taking Humira?  2. Patient is still feeling pressure "up under LEFT rib cage" and is wondering if this is caused by the colon stricture?

## 2017-05-16 NOTE — Telephone Encounter (Signed)
Spoke to patient, she does see her dermatologist several times a year and understands the importance of follow up skin checks. She states that she "does NOT have breast cancer", has had to have lumpectomy but they did not find any breast cancer. Advised and reassured that location of her active Crohn's is most likely the cause of her pain.

## 2017-05-23 ENCOUNTER — Other Ambulatory Visit: Payer: Self-pay | Admitting: Oncology

## 2017-05-23 DIAGNOSIS — Z1231 Encounter for screening mammogram for malignant neoplasm of breast: Secondary | ICD-10-CM

## 2017-05-29 ENCOUNTER — Telehealth: Payer: Self-pay | Admitting: Gastroenterology

## 2017-05-29 NOTE — Telephone Encounter (Signed)
Gae Bon from [facility] calling to state they have tried to reach out to the patient concerning her medication humira and can not get in contact with her. Pharmacy would like for [place] to let patient know medication has been put on hold until they hear from her.

## 2017-05-29 NOTE — Telephone Encounter (Signed)
Spoke to patient asked her to call pharmacy number and discuss shipment of Humira. Patient is still undecided about whether or not she wants to start taking any medication/biologic. She has an appointment with her cardiologist later this month to discuss if she is healthy enough to start Humira. She has NUMEROUS questions, and perhaps a phone call from you, to answer her questions and reassure her would be beneficial. Thank you.

## 2017-05-29 NOTE — Telephone Encounter (Signed)
I called the patient and we spoke about this again. She has several reservations about Humira - she has a history of skin cancers. This is not a contraindication to being on anti-TNF, she would need to be closely monitored by a Dermatologist but I understand her reservation about this. Her mother was also on Remicade and got lymphoma, she is very concerned about her lymphoma risk. We discussed the associated risk of that with Humira and that it is low, but she remains concerned about it.   We discussed other options. She would prefer to use Entyvio if possible given its favorable side effect profile. Given her colonic disease I think this is a reasonable option and I think we should look into it.  Robin [name] she wishes to hold off on Humira right now. Can you see if we can get Entyvio approved for her. Diagnosis is Crohn's disease, she has a history of skin cancers and is hesitant to be on anti-TNF. Thanks

## 2017-05-30 ENCOUNTER — Other Ambulatory Visit: Payer: Self-pay

## 2017-05-30 DIAGNOSIS — K50919 Crohn's disease, unspecified, with unspecified complications: Secondary | ICD-10-CM

## 2017-05-30 MED ORDER — VEDOLIZUMAB 300 MG IV SOLR: 300.0000 mg | INTRAVENOUS | 6 refills | Status: DC

## 2017-05-30 NOTE — Telephone Encounter (Signed)
Submitted request for prior auth to [facility], faxed information to [facility] to start Entyvio.

## 2017-06-03 NOTE — Progress Notes (Signed)
HPI: Follow-up atrial fibrillation. Patient seen in the emergency room January 2016 with palpitations. She had an electrocardiogram that demonstrated fibrillation and converted spontaneously to sinus rhythm. She had an event monitor 1/16 that did not show evidence of atrial fibrillation. Her echocardiogram in February 2016 showed normal LV function, mild left ventricular hypertrophy, atrial septum aneurysmal. Exercise treadmill April 2017 with no ST changes. Apixaban recommended previously but pt declined. Since last seen, the patient denies any dyspnea on exertion, orthopnea, PND, pedal edema, palpitations, syncope or chest pain.   Current Outpatient Medications  Medication Sig Dispense Refill  . Biotin 5 MG CAPS Take 5 mg by mouth daily.     Marland Kitchen denosumab (PROLIA) 60 MG/ML SOLN injection Inject 60 mg into the skin every 6 (six) months. Administer in upper arm, thigh, or abdomen    . ranitidine (ZANTAC) 150 MG tablet TAKE 1 TABLET BY MOUTH TWICE A DAY 60 tablet 3  . sodium chloride 0.9 % SOLN 250 mL with vedolizumab 300 MG SOLR 300 mg Inject 300 mg into the vein every 8 (eight) weeks. After induction phase of week 0,2,6 then every 8 weeks 300 mg 6  . SYNTHROID 75 MCG tablet Take 1 tablet (75 mcg total) by mouth daily before breakfast. 90 tablet 3  . verapamil (CALAN-SR) 180 MG CR tablet TAKE 1 TABLET BY MOUTH DAILY 30 tablet 12   Current Facility-Administered Medications  Medication Dose Route Frequency Provider Last Rate Last Dose  . 0.9 %  sodium chloride infusion  500 mL Intravenous Once Armbruster, Carlota Raspberry, MD         Past Medical History:  Diagnosis Date  . Anemia   . Anxiety   . Arthritis   . Asthma    seasonal  . Atypical lobular hyperplasia (ALH) of left breast 07/2015  . Cancer ([place])    skin  . Cataract   . Crohn's disease ([place])    no problems  . GERD (gastroesophageal reflux disease)   . Hypertension   . Hypothyroidism   . IUD (intrauterine device) in place 1983     Safety coil per Dr. Valeta Harms note attempted to retrieve but unable to do so  . Osteoporosis 12/2016   T score -2.7 stable from prior DEXA on Prolia  . Thyroid disease    Hypothyroid    Past Surgical History:  Procedure Laterality Date  . Basal and squamous cell Ca excised    . COLONOSCOPY N/A 07/01/2013   Procedure: COLONOSCOPY;  Surgeon: Garlan Fair, MD;  Location: [facility];  Service: Endoscopy;  Laterality: N/A;  . Fissure surg    . RADIOACTIVE SEED GUIDED EXCISIONAL BREAST BIOPSY Left 09/25/2015   Procedure: RADIOACTIVE SEED GUIDED EXCISIONAL BREAST BIOPSY;  Surgeon: Rolm Bookbinder, MD;  Location: [facility];  Service: General;  Laterality: Left;  . TRIGGER FINGER RELEASE Right 02/01/2016   Procedure: RELEASE TRIGGER FINGER/A-1 PULLEY, right thumb;  Surgeon: Daryll Brod, MD;  Location: [facility];  Service: Orthopedics;  Laterality: Right;  FAB    Social History   Socioeconomic History  . Marital status: Married    Spouse name: Not on file  . Number of children: Not on file  . Years of education: Not on file  . Highest education level: Not on file  Occupational History  . Not on file  Social Needs  . Financial resource strain: Not on file  . Food insecurity:    Worry: Not on file  Inability: Not on file  . Transportation needs:    Medical: Not on file    Non-medical: Not on file  Tobacco Use  . Smoking status: Never Smoker  . Smokeless tobacco: Never Used  Substance and Sexual Activity  . Alcohol use: Yes    Alcohol/week: 1.2 oz    Types: 2 Standard drinks or equivalent per week    Comment: Occasional  . Drug use: No  . Sexual activity: Yes    Birth control/protection: Post-menopausal    Comment: Pt. declined sexual hx questions  Lifestyle  . Physical activity:    Days per week: Not on file    Minutes per session: Not on file  . Stress: Not on file  Relationships  . Social connections:    Talks on phone: Not on  file    Gets together: Not on file    Attends religious service: Not on file    Active member of club or organization: Not on file    Attends meetings of clubs or organizations: Not on file    Relationship status: Not on file  . Intimate partner violence:    Fear of current or ex partner: Not on file    Emotionally abused: Not on file    Physically abused: Not on file    Forced sexual activity: Not on file  Other Topics Concern  . Not on file  Social History Narrative  . Not on file    Family History  Problem Relation Age of Onset  . Colon cancer Mother   . Heart disease Mother        Atrial fibrillation  . Crohn's disease Mother   . Heart disease Maternal Grandmother   . Hypertension Maternal Grandmother   . Breast cancer Paternal Grandmother        age unknown  . Esophageal cancer Neg Hx   . Liver cancer Neg Hx   . Pancreatic cancer Neg Hx   . Rectal cancer Neg Hx   . Stomach cancer Neg Hx     ROS: no fevers or chills, productive cough, hemoptysis, dysphasia, odynophagia, melena, hematochezia, dysuria, hematuria, rash, seizure activity, orthopnea, PND, pedal edema, claudication. Remaining systems are negative.  Physical Exam: Well-developed well-nourished in no acute distress.  Skin is warm and dry.  HEENT is normal.  Neck is supple.  Chest is clear to auscultation with normal expansion.  Cardiovascular exam is regular rate and rhythm.  Abdominal exam nontender or distended. No masses palpated. Extremities show no edema. neuro grossly intact  ECG-normal sinus rhythm at a rate of 83.  No ST changes.  Personally reviewed  A/P  1 paroxysmal atrial fibrillation-patient doing well with no recurrent episodes.  We will continue with verapamil for rate control if atrial fibrillation recurs. CHADSvasc 2.  Patient declines anticoagulation and understands higher risk of stroke.  Note she does have Crohn's and would be at increased risk of bleeding.  Continue aspirin.  2  hypertension-blood pressure is controlled.  Continue present medications.  3 history of chest pain-previous treadmill negative.  No recurrent symptoms.  Kirk Ruths, MD

## 2017-06-05 ENCOUNTER — Telehealth: Payer: Self-pay | Admitting: Gastroenterology

## 2017-06-05 NOTE — Telephone Encounter (Signed)
Patient's records faxed

## 2017-06-05 NOTE — Telephone Encounter (Signed)
Pt has appt with Dr. Dossie Der with [facility] on 06-10-17 at 8:30am. Called pt. She is aware of appt.

## 2017-06-10 ENCOUNTER — Telehealth: Payer: Self-pay | Admitting: Gastroenterology

## 2017-06-10 ENCOUNTER — Telehealth: Payer: Self-pay

## 2017-06-10 DIAGNOSIS — K5090 Crohn's disease, unspecified, without complications: Secondary | ICD-10-CM | POA: Diagnosis not present

## 2017-06-10 DIAGNOSIS — Z79899 Other long term (current) drug therapy: Secondary | ICD-10-CM | POA: Diagnosis not present

## 2017-06-10 NOTE — Telephone Encounter (Signed)
Received called from Benjamine Mola at [facility], asked that we call [facility] [phone]) to put in Rx for Entyvio. Spoke to [place], Software engineer, gave verbal order to start with induction of Entyvio 300 mg IV at week 0,2,6 then every 8 weeks. Medication will be delivered to [facility].  Called patient left her a message to schedule follow up office visit here 2-3 months after starting Entyvio. Let her also know that she will need lab work prior to 2nd infusion, usually ordered by Dr. Dossie Der. If she has questions or concerns to let us know.

## 2017-06-10 NOTE — Telephone Encounter (Signed)
Almyra Free can you clarify, I'm confused to the comments written. I referred her for [facility] as she declined anti-TNF. Is Dr. Dossie Der okay with [facility]? If so, we should proceed. Thanks

## 2017-06-10 NOTE — Telephone Encounter (Signed)
Great news. Thanks Almyra Free. I would have her follow up in a few months after she has started [facility] if you can make her a follow up appointment. She will need labs prior to her second Entyvio infusion which I am assuming Dr. Dossie Der will order. Thanks

## 2017-06-10 NOTE — Telephone Encounter (Signed)
Patient is approved for [facility], she discussed this medication with Dr. Dossie Der, patient states that this is what Dr. Dossie Der is also recommending and patient is in agreement to try it. When do you want to see her back in the office for follow up?

## 2017-06-10 NOTE — Telephone Encounter (Signed)
Faxed requested labs to [facility] in order to start Entyvio. Fax# [phone].

## 2017-06-10 NOTE — Telephone Encounter (Signed)
Patient states she was just seen by Dr.Syed at [facility] as referred by Dr.Armbruster. Per pt Dr.Syed does think pt needs entyvio infusion. Pt wants to know where to go from here.

## 2017-06-10 NOTE — Telephone Encounter (Signed)
Called [facility], left a message for patient care coordinator to please call patient to set up her infusions. Also let them know that I contacted [facility] to have them send Entyvio to [facility].

## 2017-06-10 NOTE — Telephone Encounter (Signed)
Routed to Dr. Armbruster. 

## 2017-06-12 NOTE — Telephone Encounter (Signed)
[place] calling stating a prior auth for medication entyvio needs to be done. Can start prior auth through [facility] utilization management department [phone]

## 2017-06-12 NOTE — Telephone Encounter (Signed)
Please see this message.

## 2017-06-16 ENCOUNTER — Encounter: Payer: Self-pay | Admitting: Cardiology

## 2017-06-16 ENCOUNTER — Ambulatory Visit: Payer: BLUE CROSS/BLUE SHIELD

## 2017-06-16 ENCOUNTER — Ambulatory Visit: Payer: BLUE CROSS/BLUE SHIELD | Admitting: Cardiology

## 2017-06-16 VITALS — BP 132/84 | HR 83 | Ht 65.5 in | Wt 153.4 lb

## 2017-06-16 DIAGNOSIS — I4891 Unspecified atrial fibrillation: Secondary | ICD-10-CM | POA: Diagnosis not present

## 2017-06-16 DIAGNOSIS — I10 Essential (primary) hypertension: Secondary | ICD-10-CM | POA: Diagnosis not present

## 2017-06-16 NOTE — Patient Instructions (Signed)
Your physician wants you to follow-up in: ONE YEAR WITH DR CRENSHAW You will receive a reminder letter in the mail two months in advance. If you don't receive a letter, please call our office to schedule the follow-up appointment.   If you need a refill on your cardiac medications before your next appointment, please call your pharmacy.  

## 2017-06-17 NOTE — Telephone Encounter (Signed)
Per Morton Amy "[employer] auth# [id number] good from 06/02/17-06/02/18 Faxed to [facility]."

## 2017-06-18 ENCOUNTER — Ambulatory Visit
Admission: RE | Admit: 2017-06-18 | Discharge: 2017-06-18 | Disposition: A | Discharge status: Home / Self Care | Payer: BLUE CROSS/BLUE SHIELD | Source: Ambulatory Visit | Attending: Oncology | Admitting: Oncology

## 2017-06-18 DIAGNOSIS — Z1231 Encounter for screening mammogram for malignant neoplasm of breast: Secondary | ICD-10-CM

## 2017-06-18 NOTE — Telephone Encounter (Signed)
Pt RS appt 06/30/17 at 9:00

## 2017-06-21 ENCOUNTER — Other Ambulatory Visit: Payer: Self-pay | Admitting: Gastroenterology

## 2017-06-30 ENCOUNTER — Ambulatory Visit (INDEPENDENT_AMBULATORY_CARE_PROVIDER_SITE_OTHER): Payer: BLUE CROSS/BLUE SHIELD | Admitting: Gynecology

## 2017-06-30 DIAGNOSIS — M810 Age-related osteoporosis without current pathological fracture: Secondary | ICD-10-CM | POA: Diagnosis not present

## 2017-06-30 MED ORDER — DENOSUMAB 60 MG/ML ~~LOC~~ SOSY
60.0000 mg | PREFILLED_SYRINGE | Freq: Once | SUBCUTANEOUS | Status: AC
  Administered 2017-06-30: 60 mg via SUBCUTANEOUS

## 2017-07-02 ENCOUNTER — Encounter: Payer: Self-pay | Admitting: Gynecology

## 2017-07-02 NOTE — Telephone Encounter (Signed)
Prolia Given 06/30/17 Next inj 11//7/19

## 2017-07-06 NOTE — Progress Notes (Signed)
[place]  Telephone:([phone]) [phone] [phone]) [phone]     ID: Robin [name] DOB: February 16, 1956  MR#: [id number]  GEX#:[id number]  Patient Care Team: Josetta Huddle, MD as PCP - General (Internal Medicine) Rolm Bookbinder, MD as Consulting Physician (General Surgery) Magrinat, Virgie Dad, MD as Consulting Physician (Oncology) Phineas Real, Belinda Block, MD as Consulting Physician (Gynecology) Harriett Sine, MD as Consulting Physician (Dermatology) Stanford Breed Denice Bors, MD as Consulting Physician (Cardiology) Garlan Fair, MD as Consulting Physician (Gastroenterology) OTHER MD:  CHIEF COMPLAINT: atypical lobular hyperplasia  CURRENT TREATMENT:  observation   HISTORY  OF CURRENT ILLNESS: from the original intake note:  Robin [name] herself noted a change in her left breast and brought it to Dr. Christia Reading Fontaine's attention. Hhe set her up for diagnostic left breast mammogram with tomography and left breast ultrasonography 07/17/2015. This found the breast density to be category C.there was a subtle area of distortion in the left breast upper outer quadrant which was not palpable on examination. Ultrasonography found no definite correlate. Tomosynthesis guided biopsy was performed 07/27/2015, and showed ([id number]) atypical lobular hyperplasia.  The patient was evaluated by surgery and after appropriate discussion excisional biopsy was performed 09/25/2015. This showed ([id number]) a complex sclerosing lesion with the usual ductal hyperplasia but also again atypical lobular hyperplasia.  The patient's subsequent history is as detailed below  INTERVAL HISTORY: Robin [name] Returns today for follow-up of her history of atypical lobular hyperplasia.   Since her last visit to the office, she underwent a bilateral diagnostic mammography with tomography at the [facility] 06/18/2017 found the breast density to be category B. No mammographic evidence of malignancy.   She also had a  bone density scan completed at [facility] on 01/23/2017 that showed osteoporosis  She had a colonoscopy completed on 05/01/2017 with results of: Crohn's stricture in the sigmoid colon. Biopsied. Crohn's disease with colonic involvement. Severe patchy inflammation was found. This was severe in the left and transverse colon. Biopsied. Anal papilla(e) were hypertrophied.  She had an endoscopy completed on 05/01/2017 with results of: Esophagogastric landmarks identified. 1 cm hiatal hernia. Normal esophagus. Normal stomach. Duodenal mucosal lymphangiectasia. No evidence of upper tract Crohn's disease.  Pathology from this colonoscopy ([id number]) on 05/01/2017 showed: Surgical [P], duodenal nodule with duodenal mucosa with lymphangioma. No features of sprue, adenomatous change, or evidence of malignancy. Surgical [P], random sites with chronic minimally active colitis consistent with inflammatory bowel disease. Negative for dysplasia. No granulomas. Surgical [P], colonic stricture with chronic moderately active colitis with focal erosion consistent with inflammatory bowel disease. Negative for dysplasia. No granulomas.   She had a CT Entero AP with contrast on 05/08/2017 that showed: Status post ileocecal resection. No small bowel wall thickening or mass is seen. No evidence of small bowel obstruction. Long segment wall thickening/stricture involving the descending colon. Additional medial segment wall thickening with mild narrowing/stricture involving the mid transverse colon. These findings are compatible with patient's known stricturing Crohn's disease. No evidence of pericolonic fistula or abscess.   REVIEW OF SYSTEMS: Robin [name] is doing better since her last visit. Her crohn's disease is currently under control. She is unsure what her next steps for treatment are. She denies unusual headaches, visual changes, nausea, vomiting, or dizziness. There has been no unusual cough, phlegm production,  or pleurisy. This been no change in bowel or bladder habits. She denies unexplained fatigue or unexplained weight loss, bleeding, rash, or fever. A detailed review of systems was otherwise stable.  PAST MEDICAL HISTORY: Past Medical History:  Diagnosis Date  . Anemia   . Anxiety   . Arthritis   . Asthma    seasonal  . Atypical lobular hyperplasia (ALH) of left breast 07/2015  . Cancer ([place])    skin  . Cataract   . Crohn's disease ([place])    no problems  . GERD (gastroesophageal reflux disease)   . Hypertension   . Hypothyroidism   . IUD (intrauterine device) in place 1983   Safety coil per Dr. Valeta Harms note attempted to retrieve but unable to do so  . Osteoporosis 12/2016   T score -2.7 stable from prior DEXA on Prolia  . Thyroid disease    Hypothyroid    PAST SURGICAL HISTORY: Past Surgical History:  Procedure Laterality Date  . Basal and squamous cell Ca excised    . BREAST BIOPSY Left 2018  . BREAST EXCISIONAL BIOPSY Left 2018  . COLONOSCOPY N/A 07/01/2013   Procedure: COLONOSCOPY;  Surgeon: Garlan Fair, MD;  Location: [facility];  Service: Endoscopy;  Laterality: N/A;  . Fissure surg    . RADIOACTIVE SEED GUIDED EXCISIONAL BREAST BIOPSY Left 09/25/2015   Procedure: RADIOACTIVE SEED GUIDED EXCISIONAL BREAST BIOPSY;  Surgeon: Rolm Bookbinder, MD;  Location: [facility];  Service: General;  Laterality: Left;  . TRIGGER FINGER RELEASE Right 02/01/2016   Procedure: RELEASE TRIGGER FINGER/A-1 PULLEY, right thumb;  Surgeon: Daryll Brod, MD;  Location: [facility];  Service: Orthopedics;  Laterality: Right;  FAB    FAMILY HISTORY Family History  Problem Relation Age of Onset  . Colon cancer Mother   . Heart disease Mother        Atrial fibrillation  . Crohn's disease Mother   . Heart disease Maternal Grandmother   . Hypertension Maternal Grandmother   . Breast cancer Paternal Grandmother        age unknown  . Esophageal cancer Neg  Hx   . Liver cancer Neg Hx   . Pancreatic cancer Neg Hx   . Rectal cancer Neg Hx   . Stomach cancer Neg Hx   the patient has no information regarding her father. Her mother is 37 years old and has a history of colon polyps. There are no siblings. There is no history of cancer in the family.  GYNECOLOGIC HISTORY:  No LMP recorded. Patient is postmenopausal. Robin [name] does not recall when she had her first period. She is GX P0. 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 Her husband Wille Glaser is in the roofing business.at home is just the 2 of them plus the patient's mother and 1 cat    ADVANCED DIRECTIVES: in place   HEALTH MAINTENANCE: Social History   Tobacco Use  . Smoking status: Never Smoker  . Smokeless tobacco: Never Used  Substance Use Topics  . Alcohol use: Yes    Alcohol/week: 1.2 oz    Types: 2 Standard drinks or equivalent per week    Comment: Occasional  . Drug use: No     Colonoscopy: UTD/ [facility]  PAP: 2016/ Fontaine  Bone density: 2016/ "stable"   Allergies  Allergen Reactions  . Codeine Nausea Only    Needs something for nausea to go with pain pill  . Tetracyclines & Related Diarrhea  . Biaxin [Clarithromycin] Palpitations  . Clarithromycin Palpitations    Current Outpatient Medications  Medication Sig Dispense Refill  . Biotin  5 MG CAPS Take 5 mg by mouth daily.     Marland Kitchen denosumab (PROLIA) 60 MG/ML SOLN injection Inject 60 mg into the skin every 6 (six) months. Administer in upper arm, thigh, or abdomen    . ranitidine (ZANTAC) 150 MG tablet TAKE 1 TABLET BY MOUTH TWICE A DAY 60 tablet 3  . SYNTHROID 75 MCG tablet Take 1 tablet (75 mcg total) by mouth daily before breakfast. 90 tablet 3  . verapamil (CALAN-SR) 180 MG CR tablet TAKE 1 TABLET BY MOUTH DAILY 30 tablet 12  . sodium chloride 0.9 % SOLN 250 mL with vedolizumab 300 MG SOLR 300 mg Inject 300 mg  into the vein every 8 (eight) weeks. After induction phase of week 0,2,6 then every 8 weeks (Patient not taking: Reported on 07/07/2017) 300 mg 6   Current Facility-Administered Medications  Medication Dose Route Frequency Provider Last Rate Last Dose  . 0.9 %  sodium chloride infusion  500 mL Intravenous Once Armbruster, Carlota Raspberry, MD        OBJECTIVE: middle-aged white woman in no acute distress  Vitals:   07/07/17 0840  BP: 134/84  Pulse: 79  Resp: 18  Temp: 99 F (37.2 C)  SpO2: 100%     Body mass index is 25.42 kg/m.    ECOG FS:1 - Symptomatic but completely ambulatory  Sclerae unicteric, EOMs intact Oropharynx clear and moist No cervical or supraclavicular adenopathy Lungs no rales or rhonchi Heart regular rate and rhythm Abd soft, nontender, positive bowel sounds MSK no focal spinal tenderness, no upper extremity lymphedema Neuro: nonfocal, well oriented, appropriate affect Breasts: The right breast is unremarkable.  The left breast is status post lumpectomy.  There  are no worrisome findings.  Both axillae are benign. icantly tanned in all areas including breasts (she has a backdoor pool).  LAB RESULTS: Outside labs reviewed  CMP     Component Value Date/Time   NA 139 05/05/2017 1035   K 3.8 05/05/2017 1035   CL 102 05/05/2017 1035   CO2 29 05/05/2017 1035   GLUCOSE 105 (H) 05/05/2017 1035   BUN 9 05/05/2017 1035   CREATININE 0.74 05/05/2017 1035   CALCIUM 9.7 05/05/2017 1035   PROT 7.2 05/05/2017 1035   PROT 7.2 05/05/2017 1035   ALBUMIN 3.8 05/05/2017 1035   ALBUMIN 3.8 05/05/2017 1035   AST 15 05/05/2017 1035   AST 15 05/05/2017 1035   ALT 11 05/05/2017 1035   ALT 11 05/05/2017 1035   ALKPHOS 50 05/05/2017 1035   ALKPHOS 50 05/05/2017 1035   BILITOT 0.3 05/05/2017 1035   BILITOT 0.3 05/05/2017 1035   GFRNONAA >60 05/10/2016 1321   GFRAA >60 05/10/2016 1321    INo results found for: SPEP, UPEP  Lab Results  Component Value Date   WBC 11.2 (H)  05/05/2017   NEUTROABS 7.5 05/05/2017   HGB 10.7 (L) 05/05/2017   HCT 32.7 (L) 05/05/2017   MCV 80.3 05/05/2017   PLT 555.0 (H) 05/05/2017      Chemistry      Component Value Date/Time   NA 139 05/05/2017 1035   K 3.8 05/05/2017 1035   CL 102 05/05/2017 1035   CO2 29 05/05/2017 1035   BUN 9 05/05/2017 1035   CREATININE 0.74 05/05/2017 1035      Component Value Date/Time   CALCIUM 9.7 05/05/2017 1035   ALKPHOS 50 05/05/2017 1035   ALKPHOS 50 05/05/2017 1035   AST 15 05/05/2017 1035   AST 15 05/05/2017  1035   ALT 11 05/05/2017 1035   ALT 11 05/05/2017 1035   BILITOT 0.3 05/05/2017 1035   BILITOT 0.3 05/05/2017 1035       No results found for: LABCA2  No components found for: LABCA125  No results for input(s): INR in the last 168 hours.  Urinalysis    Component Value Date/Time   COLORURINE YELLOW 08/16/2015 0006   APPEARANCEUR CLEAR 08/16/2015 0006   LABSPEC 1.022 08/16/2015 0006   PHURINE 5.5 08/16/2015 0006   GLUCOSEU NEGATIVE 08/16/2015 0006   HGBUR LARGE (A) 08/16/2015 0006   BILIRUBINUR NEGATIVE 08/16/2015 0006   KETONESUR NEGATIVE 08/16/2015 0006   PROTEINUR NEGATIVE 08/16/2015 0006   UROBILINOGEN 0.2 12/16/2013 0854   NITRITE NEGATIVE 08/16/2015 0006   LEUKOCYTESUR MODERATE (A) 08/16/2015 0006     STUDIES:  Bone density on 01/23/2017 showed osteoporosis.   Mm Screening Breast Tomo Bilateral  Result Date: 06/18/2017 CLINICAL DATA:  Screening. EXAM: DIGITAL SCREENING BILATERAL MAMMOGRAM WITH TOMO AND CAD COMPARISON:  Previous exam(s). ACR Breast Density Category b: There are scattered areas of fibroglandular density. FINDINGS: There are no findings suspicious for malignancy. Images were processed with CAD. IMPRESSION: No mammographic evidence of malignancy. A result letter of this screening mammogram will be mailed directly to the patient. RECOMMENDATION: Screening mammogram in one year. (Code:[id number]) BI-RADS CATEGORY  1: Negative. Electronically  Signed   By: Ammie Ferrier M.D.   On: 06/18/2017 11:28     ELIGIBLE FOR AVAILABLE RESEARCH PROTOCOL: no   ASSESSMENT: 62 y.o. [place], [place] woman Status post left breast upper outer quadrant biopsy 07/27/2015 showing atypical lobular hyperplasia  (1) left lumpectomy 09/25/2015 showed a complex sclerosing lesion and atypical lobular hyperplasia.  (2) Crohn's disease  PLAN: Robin [name] is now 2 years out from her initial biopsy.  There have been no worrisome developments and she understands she has a very good prognosis overall, as her breasts are not dense and any developments should be immediately apparent.  The big decision she is struggling with is how aggressively to treat her Crohn's disease.  She had many questions regarding possible side effects, infections, and secondary cancers from the immune modulators.  My suggestion to her was that she try 1 of these agents for about 6 months to see how she would feel without Crohn's disease.  Basically she would be exchanging Crohn's for a chronic mild immunocompromise.  However what she really wants to do is continue to deal with the Crohn's symptoms with exercise and diet and "save the more aggressive treatments" for when the Crohn's becomes more of a quality of life issue for her.  I commended her working closely with Dr. Havery Moros so that she keeps all her options open  She will return to see me in a year.  She knows to call for any other issues that may develop before the next visit. Magrinat, Virgie Dad, MD  07/07/17 8:58 AM Medical Oncology and Hematology [facility] [address] [place], [place] [address] Tel. [phone]    Fax. [phone]    This document serves as a record of services personally performed by Chauncey Cruel, MD. It was created on his behalf by Margit Banda, a trained medical scribe. The creation of this record is based on the scribe's personal observations and the provider's statements to  them.   I have reviewed the above documentation for accuracy and completeness, and I agree with the above.

## 2017-07-07 ENCOUNTER — Inpatient Hospital Stay: Payer: BLUE CROSS/BLUE SHIELD | Attending: Oncology | Admitting: Oncology

## 2017-07-07 VITALS — BP 134/84 | HR 79 | Temp 99.0°F | Resp 18 | Ht 65.5 in | Wt 155.1 lb

## 2017-07-07 DIAGNOSIS — N6092 Unspecified benign mammary dysplasia of left breast: Secondary | ICD-10-CM | POA: Insufficient documentation

## 2017-07-07 DIAGNOSIS — K5090 Crohn's disease, unspecified, without complications: Secondary | ICD-10-CM | POA: Diagnosis not present

## 2017-07-11 ENCOUNTER — Telehealth: Payer: Self-pay | Admitting: Oncology

## 2017-07-11 NOTE — Telephone Encounter (Signed)
Left message re May 2020 lab/fu. Schedule mailed.

## 2017-07-17 ENCOUNTER — Telehealth: Payer: Self-pay | Admitting: Oncology

## 2017-07-17 NOTE — Telephone Encounter (Signed)
Patient called in with questions regarding f/u w/ labs in one year.  I sent in basket message to Ellwood Dense 5/23

## 2017-07-22 ENCOUNTER — Telehealth: Payer: Self-pay | Admitting: *Deleted

## 2017-07-22 NOTE — Telephone Encounter (Signed)
-----   Message from Cyndia Bent, RN sent at 07/22/2017  9:39 AM EDT -----   ----- Message ----- From: Modena Morrow Sent: 07/17/2017   3:02 PM To: Cyndia Bent, RN  Good afternoon   Patient called and was questioning why she needed labs with f/u appt in one year?  Can you call her and advise?   Thanks [employer]

## 2017-07-31 ENCOUNTER — Telehealth: Payer: Self-pay | Admitting: Gastroenterology

## 2017-07-31 NOTE — Telephone Encounter (Signed)
Called and left a message, no answer

## 2017-07-31 NOTE — Telephone Encounter (Signed)
Routed to Dr. Armbruster. 

## 2017-08-01 NOTE — Telephone Encounter (Signed)
Put in recall to schedule patient a 3 month follow up visit.

## 2017-08-01 NOTE — Telephone Encounter (Signed)
Called patient, she had questions about her workup and treatment. Discussed risks of uncontrolled Crohn's versus risks of Entyvio, she has severe Crohn's, I think would benefit from therapy. She understands risks / benefits of Entyvio and wants to proceed with it. She is completing her dental work (root canal / bridge?) in the next week or so, recommend she start Entyvio once that has been completed, start in 2 weeks or so. She agreed.  Almyra Free can you book her a follow up appointment with me in 3 months, and then check to ensure she is scheduled for her infusion? thanks

## 2017-08-07 DIAGNOSIS — Z85828 Personal history of other malignant neoplasm of skin: Secondary | ICD-10-CM | POA: Diagnosis not present

## 2017-08-07 DIAGNOSIS — L570 Actinic keratosis: Secondary | ICD-10-CM | POA: Diagnosis not present

## 2017-08-07 DIAGNOSIS — D0471 Carcinoma in situ of skin of right lower limb, including hip: Secondary | ICD-10-CM | POA: Diagnosis not present

## 2017-08-07 DIAGNOSIS — L578 Other skin changes due to chronic exposure to nonionizing radiation: Secondary | ICD-10-CM | POA: Diagnosis not present

## 2017-08-07 DIAGNOSIS — L821 Other seborrheic keratosis: Secondary | ICD-10-CM | POA: Diagnosis not present

## 2017-08-07 DIAGNOSIS — D225 Melanocytic nevi of trunk: Secondary | ICD-10-CM | POA: Diagnosis not present

## 2017-08-27 DIAGNOSIS — L570 Actinic keratosis: Secondary | ICD-10-CM | POA: Diagnosis not present

## 2017-08-27 DIAGNOSIS — D0471 Carcinoma in situ of skin of right lower limb, including hip: Secondary | ICD-10-CM | POA: Diagnosis not present

## 2017-09-04 DIAGNOSIS — K5090 Crohn's disease, unspecified, without complications: Secondary | ICD-10-CM | POA: Diagnosis not present

## 2017-09-10 ENCOUNTER — Telehealth: Payer: Self-pay

## 2017-09-10 NOTE — Telephone Encounter (Signed)
Spoke to patient and she started her Entyvio infusion, she will be going for her second infusion next week. I have scheduled her for a 3 month follow up on 11/11/17.

## 2017-09-11 NOTE — Telephone Encounter (Signed)
Great, thanks for letting me know

## 2017-09-16 ENCOUNTER — Telehealth: Payer: Self-pay | Admitting: Cardiology

## 2017-09-16 ENCOUNTER — Other Ambulatory Visit: Payer: Self-pay | Admitting: *Deleted

## 2017-09-16 MED ORDER — VERAPAMIL HCL ER 180 MG PO TBCR: 180.0000 mg | EXTENDED_RELEASE_TABLET | Freq: Every day | ORAL | 3 refills | Status: DC

## 2017-09-16 NOTE — Telephone Encounter (Signed)
New Message     *STAT* If patient is at the pharmacy, call can be transferred to refill team.   1. Which medications need to be refilled? (please list name of each medication and dose if known) verapamil (CALAN-SR) 180 MG CR tablet  2. Which pharmacy/location (including street and city if local pharmacy) is medication to be sent to? [facility] [address] [place] [phone]  3. Do they need a 30 day or 90 day supply? [place]

## 2017-09-18 DIAGNOSIS — J4520 Mild intermittent asthma, uncomplicated: Secondary | ICD-10-CM | POA: Diagnosis not present

## 2017-09-18 DIAGNOSIS — D649 Anemia, unspecified: Secondary | ICD-10-CM | POA: Diagnosis not present

## 2017-09-18 DIAGNOSIS — E7800 Pure hypercholesterolemia, unspecified: Secondary | ICD-10-CM | POA: Diagnosis not present

## 2017-09-18 DIAGNOSIS — F439 Reaction to severe stress, unspecified: Secondary | ICD-10-CM | POA: Diagnosis not present

## 2017-09-18 DIAGNOSIS — E559 Vitamin D deficiency, unspecified: Secondary | ICD-10-CM | POA: Diagnosis not present

## 2017-09-18 DIAGNOSIS — R000 Tachycardia, unspecified: Secondary | ICD-10-CM | POA: Diagnosis not present

## 2017-09-18 DIAGNOSIS — K5090 Crohn's disease, unspecified, without complications: Secondary | ICD-10-CM | POA: Diagnosis not present

## 2017-09-18 DIAGNOSIS — Z0000 Encounter for general adult medical examination without abnormal findings: Secondary | ICD-10-CM | POA: Diagnosis not present

## 2017-09-18 DIAGNOSIS — M810 Age-related osteoporosis without current pathological fracture: Secondary | ICD-10-CM | POA: Diagnosis not present

## 2017-09-20 ENCOUNTER — Other Ambulatory Visit: Payer: Self-pay | Admitting: Gastroenterology

## 2017-10-15 ENCOUNTER — Telehealth: Payer: Self-pay | Admitting: Gastroenterology

## 2017-10-15 NOTE — Telephone Encounter (Signed)
Patient calling in again regarding this.

## 2017-10-15 NOTE — Telephone Encounter (Signed)
Pt called again asking to speak with you, she will be leaving for work shortly and did not want to play phone tag.

## 2017-10-16 DIAGNOSIS — K5090 Crohn's disease, unspecified, without complications: Secondary | ICD-10-CM | POA: Diagnosis not present

## 2017-10-16 NOTE — Telephone Encounter (Signed)
I spoke with the patient and advised her that Dr. Havery Moros is still working on updating his NPI information. She is advised that I have reached out to the rep. I will call her when I have heard back from the rep and the NPI update

## 2017-10-20 NOTE — Telephone Encounter (Signed)
I spoke with [place] at [facility].  Dr. Ozella Rocks NPI information has been updated to refelct [address] as his address.  Ardeth Perfect has resubmitted the claim to her insurance company.  I left a detailed message for Kandra that this has been taken care of.  She is asked to call back for additional questions or concerns.

## 2017-10-28 ENCOUNTER — Telehealth: Payer: Self-pay | Admitting: *Deleted

## 2017-10-28 NOTE — Telephone Encounter (Signed)
Pt states she has been placed on Entyvio (vedolizumab) Is there any interactions between Prolia and Burr Medico and I did some research.   There is some reaction between the 2. but it is less than 1%.  Skin infections is a side effect of Prolia and while taking the 2 together it increases the risk by 0.7%  Per Sharrie Rothman ask Dr. Denman George

## 2017-10-28 NOTE — Telephone Encounter (Addendum)
Deductible Amount Met  OOP MAX $6000 ($3330mt)  Annual exam upcoming 01/01/18  Calcium  9.7           Date 05/05/17  Upcoming dental procedures NO  Prior Authorization needed YES but still valid through 3/519  Pt estimated Cost $0  PROLIA APPT SAME DAY AS ANNUAL 01/01/18  Coverage Details: $0 One Dose,$0 Admin fee

## 2017-10-30 NOTE — Telephone Encounter (Signed)
Should be okay.  I put that question to Dr. Cruzita Lederer and she refer to studies that did not appear to show significant increased adverse reactions

## 2017-11-06 DIAGNOSIS — Z85828 Personal history of other malignant neoplasm of skin: Secondary | ICD-10-CM | POA: Diagnosis not present

## 2017-11-06 DIAGNOSIS — L738 Other specified follicular disorders: Secondary | ICD-10-CM | POA: Diagnosis not present

## 2017-11-06 DIAGNOSIS — L570 Actinic keratosis: Secondary | ICD-10-CM | POA: Diagnosis not present

## 2017-11-06 DIAGNOSIS — L814 Other melanin hyperpigmentation: Secondary | ICD-10-CM | POA: Diagnosis not present

## 2017-11-13 ENCOUNTER — Encounter: Payer: Self-pay | Admitting: Gastroenterology

## 2017-11-13 ENCOUNTER — Ambulatory Visit: Payer: BLUE CROSS/BLUE SHIELD | Admitting: Gastroenterology

## 2017-11-13 VITALS — BP 144/82 | HR 75 | Ht 65.5 in | Wt 151.0 lb

## 2017-11-13 DIAGNOSIS — K219 Gastro-esophageal reflux disease without esophagitis: Secondary | ICD-10-CM | POA: Diagnosis not present

## 2017-11-13 DIAGNOSIS — R1012 Left upper quadrant pain: Secondary | ICD-10-CM

## 2017-11-13 DIAGNOSIS — K50119 Crohn's disease of large intestine with unspecified complications: Secondary | ICD-10-CM

## 2017-11-13 MED ORDER — FAMOTIDINE 10 MG PO TABS: 10.0000 mg | ORAL_TABLET | Freq: Two times a day (BID) | ORAL | Status: DC

## 2017-11-13 NOTE — Patient Instructions (Addendum)
If you are age 62 or older, your body mass index should be between 23-30. Your Body mass index is 24.75 kg/m. If this is out of the aforementioned range listed, please consider follow up with your Primary Care Provider.  If you are age 17 or younger, your body mass index should be between 19-25. Your Body mass index is 24.75 kg/m. If this is out of the aformentioned range listed, please consider follow up with your Primary Care Provider.  Discontinue Zantac.  Start using Pepcid 24m, twice a day. You may increase as needed.  We will request your records from Dr. SAudelia Hivesoffice.  You will be due for a colonoscopy in December.  We will contact you when it is time to schedule that.  Thank you for entrusting me with your care and [facility] Dr. SCarolina Cellar

## 2017-11-13 NOTE — Progress Notes (Signed)
HPI :  Crohn's History Ileocolonic Crohn's diagnosed > 20 years ago. History of colonic strictures. She has had longstanding untreated diseases for years, historically managed with steroids PRN. Previously followed by Dr. Earle Gell. Mother had Crohn's. She has never been on any maintenance medications for her disease until July 2019 when she started on Entyvio. History of skin cancers, osteoporosis.    SINCE LAST VISIT  62 year old female here for a follow-up visit.  Since I first saw her in clinic we performed a colonoscopy in March 2019. At that time she had a severe stricture of her sigmoid colon that could only be traversed with the upper endoscope. We were able to evaluate through the transverse colon which showed multiple areas of stricturing with luminal narrowing in deep ulcerations. Right colon was not evaluated. Biopsies showed no evidence of malignant tissue. She had an upper endoscopy at that time which showed no evidence of upper tract disease. She then had a CT enterography study which showed normal small intestine but stenosis in the transverse colon with long segment of left sided colonic inflammation.   We had multiple discussions about long-term management. She has been extremely hesitant to pursue biologic therapy, given history of her mother having issues with anti-TNF agents. In light of her skin cancers and other medical problem she wanted to avoid anti-TNF therapy, and other immunomodulators. We had a lengthy discussion about options and she eventually was agreeable to start [facility], which she started in July. Since being on Entyvio she's been doing quite well. She reports her bowel function seems improved, the pain in her abdomen and urgency seems much better. For the first time in year she had a solid bowel movement. She does continue to have some left upper quadrant pain which is present all the time and never goes away. She otherwise seems to be tolerating Entyvio well,  however she does feel worn out for about a day or 2 after the infusion. She is also taking probably off for her osteoporosis. I had placed her previously on Zantac for her history of heartburn, this has helped these symptoms however she is concerned about what has been in use about Zantac recently, and asked but other options. She asked about getting the flu shot.  Health Care Maintenance: - PCV13 and PPV 23 2016/2017 - flu shot UTD  Prior colonoscopies: 05/01/17 - normal EGD. Colonoscopy with severe Crohn's colitis and associated stricturing - unable to intubate right colon due to stricturing. 10/01/2010 - active Crohn's colitis 2007 - normal ileum, active colitis 1994 - colitis and ileitis  CT enterography - 05/09/2017 - long segment bowel wall thickening descending colon with stenosis in tranverse colon, no small bowel inflammatoin  Past Medical History:  Diagnosis Date  . Anemia   . Anxiety   . Arthritis   . Asthma    seasonal  . Atypical lobular hyperplasia (ALH) of left breast 07/2015  . Cancer ([place])    skin  . Cataract   . Crohn's disease ([place])    severe colonic stricturing  . GERD (gastroesophageal reflux disease)   . Hypertension   . Hypothyroidism   . IUD (intrauterine device) in place 1983   Safety coil per Dr. Valeta Harms note attempted to retrieve but unable to do so  . Osteoporosis 12/2016   T score -2.7 stable from prior DEXA on Prolia  . Thyroid disease    Hypothyroid     Past Surgical History:  Procedure Laterality Date  . Basal and  squamous cell Ca excised    . BREAST BIOPSY Left 2018  . BREAST EXCISIONAL BIOPSY Left 2018  . COLONOSCOPY N/A 07/01/2013   Procedure: COLONOSCOPY;  Surgeon: Garlan Fair, MD;  Location: [facility];  Service: Endoscopy;  Laterality: N/A;  . Fissure surg    . RADIOACTIVE SEED GUIDED EXCISIONAL BREAST BIOPSY Left 09/25/2015   Procedure: RADIOACTIVE SEED GUIDED EXCISIONAL BREAST BIOPSY;  Surgeon: Rolm Bookbinder, MD;   Location: [place];  Service: General;  Laterality: Left;  . TRIGGER FINGER RELEASE Right 02/01/2016   Procedure: RELEASE TRIGGER FINGER/A-1 PULLEY, right thumb;  Surgeon: Daryll Brod, MD;  Location: [place];  Service: Orthopedics;  Laterality: Right;  FAB   Family History  Problem Relation Age of Onset  . Colon cancer Mother   . Heart disease Mother        Atrial fibrillation  . Crohn's disease Mother   . Heart disease Maternal Grandmother   . Hypertension Maternal Grandmother   . Breast cancer Paternal Grandmother        age unknown  . Esophageal cancer Neg Hx   . Liver cancer Neg Hx   . Pancreatic cancer Neg Hx   . Rectal cancer Neg Hx   . Stomach cancer Neg Hx    Social History   Tobacco Use  . Smoking status: Never Smoker  . Smokeless tobacco: Never Used  Substance Use Topics  . Alcohol use: Yes    Alcohol/week: 2.0 standard drinks    Types: 2 Standard drinks or equivalent per week    Comment: Occasional  . Drug use: No   Current Outpatient Medications  Medication Sig Dispense Refill  . Biotin 5 MG CAPS Take 5 mg by mouth daily.     Marland Kitchen denosumab (PROLIA) 60 MG/ML SOLN injection Inject 60 mg into the skin every 6 (six) months. Administer in upper arm, thigh, or abdomen    . sodium chloride 0.9 % SOLN 250 mL with vedolizumab 300 MG SOLR 300 mg Inject 300 mg into the vein every 8 (eight) weeks. After induction phase of week 0,2,6 then every 8 weeks 300 mg 6  . SYNTHROID 75 MCG tablet Take 1 tablet (75 mcg total) by mouth daily before breakfast. 90 tablet 3  . verapamil (CALAN-SR) 180 MG CR tablet Take 1 tablet (180 mg total) by mouth daily. 30 tablet 3  . famotidine (PEPCID AC) 10 MG tablet Take 1 tablet (10 mg total) by mouth 2 (two) times daily.     No current facility-administered medications for this visit.    Allergies  Allergen Reactions  . Codeine Nausea Only    Needs something for nausea to go with pain pill  . Tetracyclines &  Related Diarrhea  . Biaxin [Clarithromycin] Palpitations  . Clarithromycin Palpitations     Review of Systems: All systems reviewed and negative except where noted in HPI.   Lab Results  Component Value Date   WBC 11.2 (H) 05/05/2017   HGB 10.7 (L) 05/05/2017   HCT 32.7 (L) 05/05/2017   MCV 80.3 05/05/2017   PLT 555.0 (H) 05/05/2017    Lab Results  Component Value Date   CREATININE 0.74 05/05/2017   BUN 9 05/05/2017   NA 139 05/05/2017   K 3.8 05/05/2017   CL 102 05/05/2017   CO2 29 05/05/2017    Lab Results  Component Value Date   ALT 11 05/05/2017   ALT 11 05/05/2017   AST 15 05/05/2017   AST  15 05/05/2017   ALKPHOS 50 05/05/2017   ALKPHOS 50 05/05/2017   BILITOT 0.3 05/05/2017   BILITOT 0.3 05/05/2017     Physical Exam: BP (!) 144/82   Pulse 75   Ht 5' 5.5" (1.664 m)   Wt 151 lb (68.5 kg)   BMI 24.75 kg/m  Constitutional: Pleasant,well-developed, female in no acute distress. HEENT: Normocephalic and atraumatic. Conjunctivae are normal. No scleral icterus. Neck supple.  Cardiovascular: Normal rate, regular rhythm.  Pulmonary/chest: Effort normal and breath sounds normal. No wheezing, rales or rhonchi. Abdominal: Soft, nondistended, LUQ TTP, There are no masses palpable. No hepatomegaly. Extremities: no edema Lymphadenopathy: No cervical adenopathy noted. Neurological: Alert and oriented to person place and time. Skin: Skin is warm and dry. No rashes noted. Psychiatric: Normal mood and affect. Behavior is normal.   ASSESSMENT AND PLAN: 62 year old female here for reassessment of the following issues:  Crohn's disease - severe colonic stricturing. Has never had maintenance medication or biologic therapy until started on Entyvio recently. Thus far she is clinically improved and appears to be tolerating it. We discussed her course at length and long term plans. Her PNA vaccines are up to date but due for flu shot which she wants to get at [employer]. I want  to get a copy of her most recent labs from Dr. Erling Conte. Hopefully she has a nice response to [employer]. We will plan on surveillance of her disease with a colonoscopy around December time frame. Otherwise continue medications as currently scheduled.  LUQ pain - either due to her stricturing Crohn's, versus neuropathic pain on the left costal margin. I reassured her nothing else concerning noted on CT scan. We'll await her course further on Entyvio.  GERD - doing well on Zantac over concerned about the recent reports of Zantac containing [place]o. Recommend switching to Pepcid 40m BID.  Plan: - continue Entyvio and her current medication regimen - Flu shot to be done in the near future - Will obtain results of basic labs - Plan on surveillance colonoscopy in December time frame to assess response to therapy. Hopefully her strictures have opened if there is an inflammatory component and that we can traverse her entire colon. - Stop Zantac, switch to Pepcid twice daily as needed  Patient agreed with the plan, all questions answered.  SCarolina Cellar MD [facility]

## 2017-11-16 DIAGNOSIS — Z23 Encounter for immunization: Secondary | ICD-10-CM | POA: Diagnosis not present

## 2017-11-19 ENCOUNTER — Telehealth: Payer: Self-pay | Admitting: Gastroenterology

## 2017-11-19 NOTE — Telephone Encounter (Signed)
Received labs from Dr. Audelia Hives office who is administering Entyvio. She had baseline labs there from pre-Entyvio infusion, I don't see any since she started the drug in July.  Almyra Free can you confirm this with her. If she hasn't had labs recently, recommend CBC and CMET. She can do it at our lab or can do it with Dr. Dossie Der if he is planning on doing it at her next infusion. Thanks

## 2017-11-20 NOTE — Telephone Encounter (Signed)
Spoke to patient and advised that we will need some blood work at her next infusion. She is scheduled for this in the next couple of weeks. Faxed request to [facility] to have them draw labs and send results here.

## 2017-11-20 NOTE — Telephone Encounter (Signed)
Left detailed message for patient that we need to get some labs done unless of course Dr. Dossie Der is planning on doing them at her next infusion. Asked her to call back and let us know.

## 2017-12-12 DIAGNOSIS — K5090 Crohn's disease, unspecified, without complications: Secondary | ICD-10-CM | POA: Diagnosis not present

## 2017-12-15 DIAGNOSIS — H2513 Age-related nuclear cataract, bilateral: Secondary | ICD-10-CM | POA: Diagnosis not present

## 2017-12-15 DIAGNOSIS — H43811 Vitreous degeneration, right eye: Secondary | ICD-10-CM | POA: Diagnosis not present

## 2017-12-15 DIAGNOSIS — H04123 Dry eye syndrome of bilateral lacrimal glands: Secondary | ICD-10-CM | POA: Diagnosis not present

## 2017-12-26 ENCOUNTER — Telehealth: Payer: Self-pay | Admitting: *Deleted

## 2017-12-26 NOTE — Telephone Encounter (Signed)
Left voicemail for patient to call back. 

## 2017-12-26 NOTE — Telephone Encounter (Signed)
-----   Message from Roetta Sessions, [place] sent at 11/13/2017 12:39 PM EDT ----- Regarding: schedule colon in december Pt was in clinic in Sept.  Due for colon in December. Call to schedule and make nurse visit when December schedule opens.

## 2017-12-29 ENCOUNTER — Encounter: Payer: Self-pay | Admitting: Gastroenterology

## 2017-12-29 NOTE — Telephone Encounter (Signed)
Pt scheduled for colon on 02/09/18 with Dr. Havery Moros.

## 2017-12-29 NOTE — Telephone Encounter (Signed)
Patient called back and scheduled colonoscopy and previsit.

## 2018-01-01 ENCOUNTER — Encounter: Payer: Self-pay | Admitting: Gynecology

## 2018-01-01 ENCOUNTER — Ambulatory Visit: Payer: BLUE CROSS/BLUE SHIELD | Admitting: Gynecology

## 2018-01-01 VITALS — BP 124/82 | Ht 65.0 in | Wt 155.0 lb

## 2018-01-01 DIAGNOSIS — Z975 Presence of (intrauterine) contraceptive device: Secondary | ICD-10-CM | POA: Diagnosis not present

## 2018-01-01 DIAGNOSIS — N952 Postmenopausal atrophic vaginitis: Secondary | ICD-10-CM | POA: Diagnosis not present

## 2018-01-01 DIAGNOSIS — Z01419 Encounter for gynecological examination (general) (routine) without abnormal findings: Secondary | ICD-10-CM

## 2018-01-01 DIAGNOSIS — M810 Age-related osteoporosis without current pathological fracture: Secondary | ICD-10-CM | POA: Diagnosis not present

## 2018-01-01 MED ORDER — DENOSUMAB 60 MG/ML ~~LOC~~ SOSY
60.0000 mg | PREFILLED_SYRINGE | Freq: Once | SUBCUTANEOUS | Status: AC
  Administered 2018-01-01: 60 mg via SUBCUTANEOUS

## 2018-01-01 NOTE — Patient Instructions (Signed)
Follow-up in 6 months for your next Prolia shot.  Follow-up in 1 year for your annual exam

## 2018-01-01 NOTE — Progress Notes (Signed)
    Robin [name] February 06, 1956 [id number]        62 y.o.  G0P0 for annual gynecologic exam.  Doing well without gynecologic complaints.  Past medical history,surgical history, problem list, medications, allergies, family history and social history were all reviewed and documented as reviewed in the EPIC chart.  ROS:  Performed with pertinent positives and negatives included in the history, assessment and plan.   Additional significant findings : None   Exam: Robin [name] assistant Vitals:   01/01/18 0909  BP: 124/82  Weight: 155 lb (70.3 kg)  Height: 5' 5"  (1.651 m)   Body mass index is 25.79 kg/m.  General appearance:  Normal affect, orientation and appearance. Skin: Grossly normal HEENT: Without gross lesions.  No cervical or supraclavicular adenopathy. Thyroid normal.  Lungs:  Clear without wheezing, rales or rhonchi Cardiac: RR, without RMG Abdominal:  Soft, nontender, without masses, guarding, rebound, organomegaly or hernia Breasts:  Examined lying and sitting without masses, retractions, discharge or axillary adenopathy. Pelvic:  Ext, BUS, Vagina: With atrophic changes  Cervix: With atrophic changes.  Pap smear done  Uterus: Anteverted, normal size, shape and contour, midline and mobile nontender   Adnexa: Without masses or tenderness    Anus and perineum: Normal   Rectovaginal: Normal sphincter tone without palpated masses or tenderness.    Assessment/Plan:  62 y.o. G0P0 female for annual gynecologic exam.   1. Postmenopausal.  No significant menopausal symptoms or any vaginal bleeding. 2. Osteoporosis.  DEXA 12/2016 T score -2.7 stable from prior DEXA.  Currently on Prolia for approximately 6 years.  Doing well.  She received shot today.  Will continue on Prolia for now.  Issues of stopping Prolia with accelerated bone loss discussed.  Plan follow-up DEXA next year at 2-year interval. 3. History of safety coil 1983.  Dr. Cherylann Banas was unable to remove it.  It was  unclear of location although she recently had CT scan for her GI follow-up and they identified it within the uterus.  We again discussed the issues of trying to remove it versus leaving it in situ.  Given she is having no issues with this my recommendation is to leave it and avoid complications of trying to remove it.  The patient agrees with the plan. 4. Colonoscopy 2019.  Repeat at their recommended interval. 5. Mammography 05/2017.  Continue with annual mammography when due.  Breast exam normal today. 6. Pap smear/HPV 11/2013.  Pap smear done today.  No history of significant abnormal Pap smears. 7. Health maintenance.  No routine lab work done as patient does this elsewhere.  Follow-up 1 year for annual exam, 6 months for her Prolia.   Anastasio Auerbach MD, 10:07 AM 01/01/2018

## 2018-01-01 NOTE — Addendum Note (Signed)
Addended by: Nelva Nay on: 01/01/2018 11:22 AM   Modules accepted: Orders

## 2018-01-02 LAB — PAP IG W/ RFLX HPV ASCU

## 2018-01-16 NOTE — Telephone Encounter (Signed)
PROLIA GIVEN 01/01/18 NEXT INJECTION 07/03/2018

## 2018-01-26 ENCOUNTER — Encounter: Payer: Self-pay | Admitting: Gastroenterology

## 2018-01-26 ENCOUNTER — Ambulatory Visit (AMBULATORY_SURGERY_CENTER): Payer: BLUE CROSS/BLUE SHIELD | Admitting: *Deleted

## 2018-01-26 VITALS — Ht 65.5 in | Wt 157.0 lb

## 2018-01-26 DIAGNOSIS — K50119 Crohn's disease of large intestine with unspecified complications: Secondary | ICD-10-CM

## 2018-01-26 MED ORDER — NA SULFATE-K SULFATE-MG SULF 17.5-3.13-1.6 GM/177ML PO SOLN: ORAL | 0 refills | Status: DC

## 2018-01-26 NOTE — Progress Notes (Signed)
Patient denies any allergies to eggs or soy. Patient denies any problems with anesthesia/sedation. Patient denies any oxygen use at home. Patient denies taking any diet/weight loss medications or blood thinners. EMMI education offered, pt declined. Suprep $50 coupon given to pt.

## 2018-02-02 ENCOUNTER — Telehealth: Payer: Self-pay | Admitting: Cardiology

## 2018-02-02 NOTE — Telephone Encounter (Signed)
New Message   Pt c/o medication issue:  1. Name of Medication: verapamil (CALAN-SR) 180 MG CR tablet  2. How are you currently taking this medication (dosage and times per day)? Take 1 tablet (180 mg total) by mouth daily.  3. Are you having a reaction (difficulty breathing--STAT)? no  4. What is your medication issue? Pt states she is needing a refill on this medication but it is on back order but the pharmacy has an alternative which is still Verapamil but just made by a different pharmaceutical company name [employer]  And the pill is a time released capsul which needs Dr. Lonia Skinner approval. Pt states she only has 4 pills left. Please call

## 2018-02-02 NOTE — Telephone Encounter (Signed)
Ok for different formulation of long acting verapamil at same dose [employer]

## 2018-02-02 NOTE — Telephone Encounter (Signed)
Left message for patient of dr Jacalyn Lefevre recommendations. Pharmacy made aware.

## 2018-02-02 NOTE — Telephone Encounter (Signed)
FORWARD TO DR Stanford Breed

## 2018-02-05 DIAGNOSIS — L570 Actinic keratosis: Secondary | ICD-10-CM | POA: Diagnosis not present

## 2018-02-05 DIAGNOSIS — Z85828 Personal history of other malignant neoplasm of skin: Secondary | ICD-10-CM | POA: Diagnosis not present

## 2018-02-05 DIAGNOSIS — L821 Other seborrheic keratosis: Secondary | ICD-10-CM | POA: Diagnosis not present

## 2018-02-05 DIAGNOSIS — L814 Other melanin hyperpigmentation: Secondary | ICD-10-CM | POA: Diagnosis not present

## 2018-02-09 ENCOUNTER — Ambulatory Visit (AMBULATORY_SURGERY_CENTER): Payer: BLUE CROSS/BLUE SHIELD | Admitting: Gastroenterology

## 2018-02-09 ENCOUNTER — Telehealth: Payer: Self-pay

## 2018-02-09 ENCOUNTER — Encounter: Payer: Self-pay | Admitting: Gastroenterology

## 2018-02-09 VITALS — BP 116/92 | HR 71 | Temp 98.2°F | Resp 9 | Ht 65.5 in | Wt 157.0 lb

## 2018-02-09 DIAGNOSIS — K56699 Other intestinal obstruction unspecified as to partial versus complete obstruction: Secondary | ICD-10-CM | POA: Diagnosis not present

## 2018-02-09 DIAGNOSIS — K6389 Other specified diseases of intestine: Secondary | ICD-10-CM | POA: Diagnosis not present

## 2018-02-09 DIAGNOSIS — K50119 Crohn's disease of large intestine with unspecified complications: Secondary | ICD-10-CM

## 2018-02-09 DIAGNOSIS — K644 Residual hemorrhoidal skin tags: Secondary | ICD-10-CM | POA: Diagnosis not present

## 2018-02-09 DIAGNOSIS — K633 Ulcer of intestine: Secondary | ICD-10-CM

## 2018-02-09 DIAGNOSIS — Z1211 Encounter for screening for malignant neoplasm of colon: Secondary | ICD-10-CM | POA: Diagnosis not present

## 2018-02-09 DIAGNOSIS — K529 Noninfective gastroenteritis and colitis, unspecified: Secondary | ICD-10-CM | POA: Diagnosis not present

## 2018-02-09 MED ORDER — SODIUM CHLORIDE 0.9 % IV SOLN: 500.0000 mL | Freq: Once | INTRAVENOUS | Status: DC

## 2018-02-09 NOTE — Progress Notes (Signed)
Called to room to assist during endoscopic procedure.  Patient ID and intended procedure confirmed with present staff. Received instructions for my participation in the procedure from the performing physician.  

## 2018-02-09 NOTE — Progress Notes (Signed)
A/ox3 pleased with MAC, report to RN 

## 2018-02-09 NOTE — Op Note (Signed)
[facility] Patient Name: Robin [name] Procedure Date: 02/09/2018 7:20 AM MRN: [id number] Endoscopist: Remo Lipps P. Havery Moros , MD Age: 62 Referring MD:  Date of Birth: 1955-11-09 Gender: Female Account #: [id number] Procedure:                Colonoscopy Indications:              High risk colon cancer surveillance: Crohn's                            disease large intestine - history of colonic                            strictures, could not traverse the entire colon on                            last exam, now on Entyvio with improvement of                            symptoms. Medicines:                Monitored Anesthesia Care Procedure:                Pre-Anesthesia Assessment:                           - Prior to the procedure, a History and Physical                            was performed, and patient medications and                            allergies were reviewed. The patient's tolerance of                            previous anesthesia was also reviewed. The risks                            and benefits of the procedure and the sedation                            options and risks were discussed with the patient.                            All questions were answered, and informed consent                            was obtained. Prior Anticoagulants: The patient has                            taken no previous anticoagulant or antiplatelet                            agents. ASA Grade Assessment: II - A patient with  mild systemic disease. After reviewing the risks                            and benefits, the patient was deemed in                            satisfactory condition to undergo the procedure.                           After obtaining informed consent, the colonoscope                            was passed under direct vision. Throughout the                            procedure, the patient's blood pressure, pulse, and                     oxygen saturations were monitored continuously.The                            colonoscopy was performed without difficulty. The                            patient tolerated the procedure well. The quality                            of the bowel preparation was adequate. The terminal                            ileum, ileocecal valve, appendiceal orifice, and                            rectum were photographed. The Endoscope was                            introduced through the anus and advanced to the the                            terminal ileum, with identification of the                            appendiceal orifice and IC valve. Scope In: 7:37:15 AM Scope Out: 0:03:70 AM Scope Withdrawal Time: 0 hours 10 minutes 26 seconds  Total Procedure Duration: 0 hours 18 minutes 31 seconds  Findings:                 The perianal and digital rectal examinations were                            normal.                           The terminal ileum appeared normal.  Benign-appearing, intrinsic stenosis were found in                            the sigmoid colon (one), in the transverse colon                            (one) and in the ascending colon (2). These had                            some ulceration but overal seemed improved compared                            to the last exam. The pediatric colonoscope was                            initially used but could not traverse the sigmoid                            stricture due to narrowed lumen. The upper                            endoscope was used to complete the exam. Biopsies                            were taken with a cold forceps for histology of                            each of the strictures.                           Anal papilla(e) were hypertrophied. Biopsies were                            taken with a cold forceps for histology to ensure                            no dysplasia (AIN).                            The exam was otherwise without abnormality. Complications:            No immediate complications. Estimated blood loss:                            Minimal. Estimated Blood Loss:     Estimated blood loss was minimal. Impression:               - The examined portion of the ileum was normal.                           - Strictures in the sigmoid colon, in the                            transverse colon and in the ascending colon -  improved compared to the prior exam, was able to                            reach cecum, but some inflammation remains.                            Biopsied.                           - Anal papilla(e) were hypertrophied. Biopsied.                           - The examination was otherwise normal. Recommendation:           - Patient has a contact number available for                            emergencies. The signs and symptoms of potential                            delayed complications were discussed with the                            patient. Return to normal activities tomorrow.                            Written discharge instructions were provided to the                            patient.                           - Resume previous diet.                           - Continue present medications.                           - Await pathology results.                           - Recommend checking Entyvio trough levels with                            next infusion Steven P. Armbruster, MD 02/09/2018 8:05:38 AM This report has been signed electronically.

## 2018-02-09 NOTE — Telephone Encounter (Signed)
-----   Message from Yetta Flock, MD sent at 02/09/2018  4:31 PM EST ----- Regarding: Entyvio levels Beth can you help coordinate Entyvio trough level to be drawn for this patient. Needs to be done on Thursday prior to her dose of Entyvio? Thanks

## 2018-02-09 NOTE — Progress Notes (Signed)
Pt's states no medical or surgical changes since previsit or office visit. 

## 2018-02-09 NOTE — Telephone Encounter (Signed)
Left a message to call. She will need to come to our office for the kit. She will take this to the lab to have her blood drawn.

## 2018-02-09 NOTE — Patient Instructions (Signed)
Discharge instructions given. Biopsies taken. Resume previous medications. YOU HAD AN ENDOSCOPIC PROCEDURE TODAY AT [facility]:   Refer to the procedure report that was given to you for any specific questions about what was found during the examination.  If the procedure report does not answer your questions, please call your gastroenterologist to clarify.  If you requested that your care partner not be given the details of your procedure findings, then the procedure report has been included in a sealed envelope for you to review at your convenience later.  YOU SHOULD EXPECT: Some feelings of bloating in the abdomen. Passage of more gas than usual.  Walking can help get rid of the air that was put into your GI tract during the procedure and reduce the bloating. If you had a lower endoscopy (such as a colonoscopy or flexible sigmoidoscopy) you may notice spotting of blood in your stool or on the toilet paper. If you underwent a bowel prep for your procedure, you may not have a normal bowel movement for a few days.  Please Note:  You might notice some irritation and congestion in your nose or some drainage.  This is from the oxygen used during your procedure.  There is no need for concern and it should clear up in a day or so.  SYMPTOMS TO REPORT IMMEDIATELY:   Following lower endoscopy (colonoscopy or flexible sigmoidoscopy):  Excessive amounts of blood in the stool  Significant tenderness or worsening of abdominal pains  Swelling of the abdomen that is new, acute  Fever of 100F or higher   For urgent or emergent issues, a gastroenterologist can be reached at any hour by calling [phone].   DIET:  We do recommend a small meal at first, but then you may proceed to your regular diet.  Drink plenty of fluids but you should avoid alcoholic beverages for 24 hours.  ACTIVITY:  You should plan to take it easy for the rest of today and you should NOT DRIVE or use heavy  machinery until tomorrow (because of the sedation medicines used during the test).    FOLLOW UP: Our staff will call the number listed on your records the next business day following your procedure to check on you and address any questions or concerns that you may have regarding the information given to you following your procedure. If we do not reach you, we will leave a message.  However, if you are feeling well and you are not experiencing any problems, there is no need to return our call.  We will assume that you have returned to your regular daily activities without incident.  If any biopsies were taken you will be contacted by phone or by letter within the next 1-3 weeks.  Please call us at [phone] if you have not heard about the biopsies in 3 weeks.    SIGNATURES/CONFIDENTIALITY: You and/or your care partner have signed paperwork which will be entered into your electronic medical record.  These signatures attest to the fact that that the information above on your After Visit Summary has been reviewed and is understood.  Full responsibility of the confidentiality of this discharge information lies with you and/or your care-partner.

## 2018-02-10 ENCOUNTER — Telehealth: Payer: Self-pay

## 2018-02-10 ENCOUNTER — Telehealth: Payer: Self-pay | Admitting: *Deleted

## 2018-02-10 NOTE — Telephone Encounter (Signed)
  Follow up Call-  Call back number 02/09/2018 05/01/2017  Post procedure Call Back phone  # [phone] cell [phone]  Permission to leave phone message Yes Yes  Some recent data might be hidden     Patient questions:  Do you have a fever, pain , or abdominal swelling? No. Pain Score  0 *  Have you tolerated food without any problems? Yes.    Have you been able to return to your normal activities? Yes.    Do you have any questions about your discharge instructions: Diet   No. Medications  No. Follow up visit  No.  Do you have questions or concerns about your Care? No.  Actions: * If pain score is 4 or above: No action needed, pain <4.

## 2018-02-10 NOTE — Telephone Encounter (Signed)
Left message, will try again this afternoon.

## 2018-02-10 NOTE — Telephone Encounter (Signed)
Spoke with [facility] about venipuncture services for an Entyvio trough. They do not do this test and decline to do the draw for the patient using the "kit" we have. Their office is not familiar with this.  The patient will come to [place] as originally planned. She does state she will not be able to be here by 5 pm.

## 2018-02-11 ENCOUNTER — Encounter: Payer: Self-pay | Admitting: Gastroenterology

## 2018-02-12 DIAGNOSIS — K5090 Crohn's disease, unspecified, without complications: Secondary | ICD-10-CM | POA: Diagnosis not present

## 2018-02-13 ENCOUNTER — Other Ambulatory Visit: Payer: Self-pay

## 2018-02-13 DIAGNOSIS — K50119 Crohn's disease of large intestine with unspecified complications: Secondary | ICD-10-CM

## 2018-02-16 ENCOUNTER — Encounter: Payer: Self-pay | Admitting: Gastroenterology

## 2018-02-26 ENCOUNTER — Other Ambulatory Visit: Payer: Self-pay | Admitting: Cardiology

## 2018-02-27 ENCOUNTER — Telehealth: Payer: Self-pay | Admitting: Gastroenterology

## 2018-02-27 NOTE — Telephone Encounter (Signed)
Entyvio level returned - 02/11/18 - level of 9 with no antibodies.   Given she had some ongoing inflammation on colonoscopy, I think she would benefit from higher Entyvio dose.  Sheri I would like to increase Entyvio dosing for this patient to every 4 weeks if you can let her know and help coordinate. Thanks

## 2018-03-02 ENCOUNTER — Telehealth: Payer: Self-pay | Admitting: Gastroenterology

## 2018-03-02 NOTE — Telephone Encounter (Signed)
See other phone note for details. 

## 2018-03-02 NOTE — Telephone Encounter (Signed)
I spoke with [facility].  They will initiate the request to increase to q 4 weeks.  Orders faxed to [phone]. Left message for patient to call back

## 2018-03-02 NOTE — Telephone Encounter (Signed)
PT called back I did advised of the last note in the system she still have additional questions and would like a call back

## 2018-03-03 NOTE — Telephone Encounter (Signed)
I left a detailed message for the patient that she is needing to increase to q 4 weeks and that she will be contacted directly to schedule.

## 2018-03-03 NOTE — Telephone Encounter (Signed)
Left message for patient to call back  

## 2018-03-03 NOTE — Telephone Encounter (Signed)
Pt returned your call, her only additional question was if she should contact [facility] to make an appt or if they will contact her. I told pt that they will contact her but if this is not correct, let me know and I will call her. Thank you.

## 2018-03-03 NOTE — Telephone Encounter (Signed)
See other phone notes for details.

## 2018-03-09 NOTE — Telephone Encounter (Signed)
Left a message for Stephanine at [facility] to call with an update on the status

## 2018-03-09 NOTE — Telephone Encounter (Signed)
I spoke with Robin [name] today about change in frequency of Entyvio to q 4 weeks.  Patient's insurance is requesting additional documentation.  That was sent in late last week.  They have 72 business hours to complete determination.  They hope to schedule an infusion for early next week.

## 2018-03-10 ENCOUNTER — Encounter: Payer: Self-pay | Admitting: Gynecology

## 2018-03-11 NOTE — Telephone Encounter (Signed)
Stephanie from [facility] called to inform that Entyvio inf q 4 w was approved by ins.  Colletta Maryland will reach out to pt to schedule infusion.

## 2018-03-19 DIAGNOSIS — K5090 Crohn's disease, unspecified, without complications: Secondary | ICD-10-CM | POA: Diagnosis not present

## 2018-03-20 DIAGNOSIS — S51859A Open bite of unspecified forearm, initial encounter: Secondary | ICD-10-CM | POA: Diagnosis not present

## 2018-03-29 ENCOUNTER — Other Ambulatory Visit: Payer: Self-pay | Admitting: Gastroenterology

## 2018-03-30 ENCOUNTER — Other Ambulatory Visit: Payer: Self-pay

## 2018-03-30 MED ORDER — FAMOTIDINE 20 MG PO TABS: 20.0000 mg | ORAL_TABLET | Freq: Two times a day (BID) | ORAL | 5 refills | Status: DC

## 2018-03-30 NOTE — Progress Notes (Signed)
Pt requested Pepcid 28m prescription to see how well her insurance will cover.  Ranitidine taken off her Med list.

## 2018-03-30 NOTE — Telephone Encounter (Signed)
Lm for pt to call back to discuss ranitidine Refill request

## 2018-04-09 DIAGNOSIS — Z85828 Personal history of other malignant neoplasm of skin: Secondary | ICD-10-CM | POA: Diagnosis not present

## 2018-04-09 DIAGNOSIS — D692 Other nonthrombocytopenic purpura: Secondary | ICD-10-CM | POA: Diagnosis not present

## 2018-04-09 DIAGNOSIS — D225 Melanocytic nevi of trunk: Secondary | ICD-10-CM | POA: Diagnosis not present

## 2018-04-09 DIAGNOSIS — L570 Actinic keratosis: Secondary | ICD-10-CM | POA: Diagnosis not present

## 2018-04-16 DIAGNOSIS — K5090 Crohn's disease, unspecified, without complications: Secondary | ICD-10-CM | POA: Diagnosis not present

## 2018-04-28 NOTE — Progress Notes (Signed)
HPI: Follow-up atrial fibrillation. Patient seen in the emergency room January 2016 with palpitations. She had an electrocardiogram that demonstrated fibrillation and converted spontaneously to sinus rhythm. She had an event monitor1/16that did not show evidence of atrial fibrillation. Her echocardiogram in February 2016 showed normal LV function, mild left ventricular hypertrophy, atrial septum aneurysmal.Exercise treadmill April 2017 with no ST changes.Apixaban recommended previously but pt declined. Since last seen,the patient denies any dyspnea on exertion, orthopnea, PND, pedal edema, palpitations, syncope or chest pain.   Current Outpatient Medications  Medication Sig Dispense Refill  . Biotin 5 MG CAPS Take 5 mg by mouth daily.     Marland Kitchen denosumab (PROLIA) 60 MG/ML SOLN injection Inject 60 mg into the skin every 6 (six) months. Administer in upper arm, thigh, or abdomen    . famotidine (PEPCID) 20 MG tablet Take 1 tablet (20 mg total) by mouth 2 (two) times daily. 60 tablet 5  . naproxen sodium (ALEVE) 220 MG tablet Take 220 mg by mouth daily as needed.    . sodium chloride 0.9 % SOLN 250 mL with vedolizumab 300 MG SOLR 300 mg Inject 300 mg into the vein every 8 (eight) weeks. After induction phase of week 0,2,6 then every 8 weeks 300 mg 6  . SYNTHROID 75 MCG tablet Take 1 tablet (75 mcg total) by mouth daily before breakfast. 90 tablet 3  . verapamil (CALAN-SR) 180 MG CR tablet Take 1 tablet (180 mg total) by mouth daily. 30 tablet 3  . verapamil (VERELAN PM) 180 MG 24 hr capsule TAKE ONE CAPSULE BY MOUTH DAILY 30 capsule 3   Current Facility-Administered Medications  Medication Dose Route Frequency Provider Last Rate Last Dose  . 0.9 %  sodium chloride infusion  500 mL Intravenous Once Armbruster, Carlota Raspberry, MD         Past Medical History:  Diagnosis Date  . Anemia   . Anxiety   . Arthritis   . Asthma    seasonal  . Atypical lobular hyperplasia (ALH) of left breast  07/2015  . Cancer ([place])    skin  . Cataract   . Crohn's disease ([place])    severe colonic stricturing  . GERD (gastroesophageal reflux disease)   . Hypertension   . Hypothyroidism   . IUD (intrauterine device) in place 1983   Safety coil per Dr. Valeta Harms note attempted to retrieve but unable to do so  . Osteoporosis 12/2016   T score -2.7 stable from prior DEXA on Prolia  . Thyroid disease    Hypothyroid    Past Surgical History:  Procedure Laterality Date  . Basal and squamous cell Ca excised    . BREAST BIOPSY Left 2018  . BREAST EXCISIONAL BIOPSY Left 2018  . COLONOSCOPY N/A 07/01/2013   Procedure: COLONOSCOPY;  Surgeon: Garlan Fair, MD;  Location: [facility];  Service: Endoscopy;  Laterality: N/A;  . COLONOSCOPY    . Fissure surg    . RADIOACTIVE SEED GUIDED EXCISIONAL BREAST BIOPSY Left 09/25/2015   Procedure: RADIOACTIVE SEED GUIDED EXCISIONAL BREAST BIOPSY;  Surgeon: Rolm Bookbinder, MD;  Location: [employer];  Service: General;  Laterality: Left;  . TRIGGER FINGER RELEASE Right 02/01/2016   Procedure: RELEASE TRIGGER FINGER/A-1 PULLEY, right thumb;  Surgeon: Daryll Brod, MD;  Location: [facility];  Service: Orthopedics;  Laterality: Right;  FAB    Social History   Socioeconomic History  . Marital status: Married    Spouse name: Not  on file  . Number of children: Not on file  . Years of education: Not on file  . Highest education level: Not on file  Occupational History  . Not on file  Social Needs  . Financial resource strain: Not on file  . Food insecurity:    Worry: Not on file    Inability: Not on file  . Transportation needs:    Medical: Not on file    Non-medical: Not on file  Tobacco Use  . Smoking status: Never Smoker  . Smokeless tobacco: Never Used  Substance and Sexual Activity  . Alcohol use: Yes    Alcohol/week: 2.0 standard drinks    Types: 2 Standard drinks or equivalent per week    Comment: Occasional    . Drug use: No  . Sexual activity: Yes    Birth control/protection: Post-menopausal    Comment: Pt. declined sexual hx questions  Lifestyle  . Physical activity:    Days per week: Not on file    Minutes per session: Not on file  . Stress: Not on file  Relationships  . Social connections:    Talks on phone: Not on file    Gets together: Not on file    Attends religious service: Not on file    Active member of club or organization: Not on file    Attends meetings of clubs or organizations: Not on file    Relationship status: Not on file  . Intimate partner violence:    Fear of current or ex partner: Not on file    Emotionally abused: Not on file    Physically abused: Not on file    Forced sexual activity: Not on file  Other Topics Concern  . Not on file  Social History Narrative  . Not on file    Family History  Problem Relation Age of Onset  . Colon cancer Mother   . Heart disease Mother        Atrial fibrillation  . Crohn's disease Mother   . Heart disease Maternal Grandmother   . Hypertension Maternal Grandmother   . Breast cancer Paternal Grandmother        age unknown  . Esophageal cancer Neg Hx   . Liver cancer Neg Hx   . Pancreatic cancer Neg Hx   . Rectal cancer Neg Hx   . Stomach cancer Neg Hx     ROS: no fevers or chills, productive cough, hemoptysis, dysphasia, odynophagia, melena, hematochezia, dysuria, hematuria, rash, seizure activity, orthopnea, PND, pedal edema, claudication. Remaining systems are negative.  Physical Exam: Well-developed well-nourished in no acute distress.  Skin is warm and dry.  HEENT is normal.  Neck is supple.  Chest is clear to auscultation with normal expansion.  Cardiovascular exam is regular rate and rhythm.  Abdominal exam nontender or distended. No masses palpated. Extremities show no edema. neuro grossly intact  ECG-sinus rhythm at a rate of 92, no significant ST changes.  Personally reviewed  A/P  1 paroxysmal  atrial fibrillation-patient has had no recurrent episodes.  Continue present dose of verapamil for rate control if atrial fibrillation recurs. CHADSvasc2.  She has previously declined anticoagulation and understands the risk of embolic event including CVA.  She would also be at increased risk of bleeding given Crohn's.  2 hypertension-patient's blood pressure is elevated.  Increase verapamil to 360 mg daily and follow.  3 chest pain-no recurrent symptoms to date.  Previous treadmill negative.  We will not pursue further evaluation unless  she has more symptoms.  Kirk Ruths, MD

## 2018-05-04 ENCOUNTER — Ambulatory Visit: Payer: BLUE CROSS/BLUE SHIELD | Admitting: Cardiology

## 2018-05-04 ENCOUNTER — Encounter: Payer: Self-pay | Admitting: Cardiology

## 2018-05-04 ENCOUNTER — Other Ambulatory Visit: Payer: Self-pay | Admitting: Cardiology

## 2018-05-04 VITALS — BP 140/84 | HR 92 | Ht 65.5 in | Wt 159.0 lb

## 2018-05-04 DIAGNOSIS — R072 Precordial pain: Secondary | ICD-10-CM

## 2018-05-04 DIAGNOSIS — I10 Essential (primary) hypertension: Secondary | ICD-10-CM | POA: Diagnosis not present

## 2018-05-04 DIAGNOSIS — I4891 Unspecified atrial fibrillation: Secondary | ICD-10-CM

## 2018-05-04 MED ORDER — VERAPAMIL HCL ER 360 MG PO CP24: 360.0000 mg | ORAL_CAPSULE | Freq: Every day | ORAL | 3 refills | Status: DC

## 2018-05-04 NOTE — Patient Instructions (Signed)
Medication Instructions:  INCREASE VERAPAMIL TO 360 MG ONCE DAILY=2 OF THE 180 MG TABLETS ONCE DAILY If you need a refill on your cardiac medications before your next appointment, please call your pharmacy.   Lab work: If you have labs (blood work) drawn today and your tests are completely normal, you will receive your results only by: Marland Kitchen MyChart Message (if you have MyChart) OR . A paper copy in the mail If you have any lab test that is abnormal or we need to change your treatment, we will call you to review the results  Follow-Up: At [facility], you and your health needs are our priority.  As part of our continuing mission to provide you with exceptional heart care, we have created designated Provider Care Teams.  These Care Teams include your primary Cardiologist (physician) and Advanced Practice Providers (APPs -  Physician Assistants and Nurse Practitioners) who all work together to provide you with the care you need, when you need it. You will need a follow up appointment in 12 months.  Please call our office 2 months in advance to schedule this appointment.  You may see Kirk Ruths MD or one of the following Advanced Practice Providers on your designated Care Team:   Kerin Ransom, PA-C Roby Lofts, [place] . Sande Rives, PA-C

## 2018-05-05 NOTE — Telephone Encounter (Signed)
Rx(s) sent to pharmacy electronically.  

## 2018-05-06 ENCOUNTER — Other Ambulatory Visit: Payer: Self-pay | Admitting: Cardiology

## 2018-05-06 DIAGNOSIS — I10 Essential (primary) hypertension: Secondary | ICD-10-CM

## 2018-05-06 DIAGNOSIS — I4891 Unspecified atrial fibrillation: Secondary | ICD-10-CM

## 2018-05-06 MED ORDER — VERAPAMIL HCL ER 180 MG PO CP24: 360.0000 mg | ORAL_CAPSULE | Freq: Every day | ORAL | 1 refills | Status: DC

## 2018-05-06 NOTE — Telephone Encounter (Signed)
Verapamil dose change to 378m daily during last office visit (2 caps of 1845mdaily okay by DR CrStanford Breed see AVS)  New Rx sent to pharmacy as requested.

## 2018-05-06 NOTE — Telephone Encounter (Signed)
New Message:   Linton Rump from [facility] called. He said they are unable to get the Verapamil 350. He wants to know if DR Stanford Breed wants to use something else or 2 180 mg of Verapamil?

## 2018-05-06 NOTE — Telephone Encounter (Signed)
Follow up   Pt is calling about her medication Verapamil. She says the Pharmacist needs it to be written out in a new dosage.  Please call

## 2018-05-14 ENCOUNTER — Telehealth: Payer: Self-pay | Admitting: Cardiology

## 2018-05-14 DIAGNOSIS — K5090 Crohn's disease, unspecified, without complications: Secondary | ICD-10-CM | POA: Diagnosis not present

## 2018-05-14 NOTE — Telephone Encounter (Signed)
New Message:   Pt said Dr Stanford Breed increased her Verapimil last week. She said her last blood pressure read was 159/94. She said she was a little concern and wanted to keep an eye on it.

## 2018-05-14 NOTE — Telephone Encounter (Signed)
Left message for pt to call.

## 2018-05-14 NOTE — Telephone Encounter (Signed)
Spoke with pt, she was getting an infusion today when she was told her bp was high. She was in a very stressful situation at that time. Reassurance given to the patient, she will continue to monitor and let us know if continues to be elevated.

## 2018-05-14 NOTE — Telephone Encounter (Signed)
Follow Up:    Pt is retuning your call.

## 2018-06-11 ENCOUNTER — Other Ambulatory Visit: Payer: Self-pay | Admitting: Internal Medicine

## 2018-06-11 DIAGNOSIS — Z1231 Encounter for screening mammogram for malignant neoplasm of breast: Secondary | ICD-10-CM

## 2018-06-11 DIAGNOSIS — Z79899 Other long term (current) drug therapy: Secondary | ICD-10-CM | POA: Diagnosis not present

## 2018-06-11 DIAGNOSIS — K5090 Crohn's disease, unspecified, without complications: Secondary | ICD-10-CM | POA: Diagnosis not present

## 2018-06-11 DIAGNOSIS — M810 Age-related osteoporosis without current pathological fracture: Secondary | ICD-10-CM | POA: Diagnosis not present

## 2018-07-01 ENCOUNTER — Telehealth: Payer: Self-pay | Admitting: *Deleted

## 2018-07-01 NOTE — Telephone Encounter (Addendum)
Deductible amount met  OOP MAX $6000 ($293mt)  Annual exam 01/01/18 TF  Calcium 9.2            Date 09/04/17  Upcoming dental procedures   Prior Authorization needed YES in process Pt estimated Cost $0 Approved 07/01/2018 to 07/01/2019   Appt 07/13/2018 @ 9:00    Coverage Details: $0 one dose,$0 admin fee

## 2018-07-03 ENCOUNTER — Other Ambulatory Visit: Payer: Self-pay | Admitting: Surgery

## 2018-07-03 DIAGNOSIS — E042 Nontoxic multinodular goiter: Secondary | ICD-10-CM

## 2018-07-08 ENCOUNTER — Other Ambulatory Visit: Payer: Self-pay

## 2018-07-08 DIAGNOSIS — N6092 Unspecified benign mammary dysplasia of left breast: Secondary | ICD-10-CM

## 2018-07-08 NOTE — Progress Notes (Signed)
Robin [name]  Telephone:([phone]) [phone] Fax:(336) [phone]     ID: Robin [name] DOB: 1955-07-22  MR#: [id number]  JTT#:[id number]  Patient Care Team: Robin Huddle, MD as PCP - General (Internal Medicine) Robin Bookbinder, MD as Consulting Physician (General Surgery) Robin [name], Robin Dad, MD as Consulting Physician (Oncology) Robin [name], Robin Block, MD as Consulting Physician (Gynecology) Robin Sine, MD as Consulting Physician (Dermatology) Robin Breed Denice Bors, MD as Consulting Physician (Cardiology) Robin Fair, MD as Consulting Physician (Gastroenterology) Robin [name], Robin Raspberry, MD as Consulting Physician (Gastroenterology) OTHER MD:   CHIEF COMPLAINT: atypical lobular hyperplasia  CURRENT TREATMENT:  observation   INTERVAL HISTORY: Robin [name] returns today for follow-up of her history of atypical lobular hyperplasia.   Since her last visit, she has not undergone any additional studies. She is scheduled to undergo thyroid ultrasound on 07/21/2018 and bilateral screening mammogram on 08/13/2018.  Her atrial fibrillation is controlled or verapamil.   REVIEW OF SYSTEMS: Robin [name] reports doing well overall. She loves being home-- she has worked to make her home the way she likes it. She notes, however, that she finds things around the house that remind her of her mother. She lives with her husband and two cats. Her husband works outside the home in Architect, and then he comes home and showers. She underwent repeat colonoscopy on 02/09/2018 for Crohn's surveillance, which returned benign pathology [phone]).   The patient denies unusual headaches, visual changes, nausea, vomiting, stiff neck, dizziness, or gait imbalance. There has been no cough, phlegm production, or pleurisy, no chest pain or pressure, and no change in bowel or bladder habits. The patient denies fever, rash, bleeding, unexplained fatigue or unexplained weight loss. A detailed review of systems was  otherwise entirely negative.   HISTORY OF CURRENT ILLNESS: from the original intake note:  [name] herself noted a change in her left breast and brought it to Dr. Christia Reading Fontaine's attention. Hhe set her up for diagnostic left breast mammogram with tomography and left breast ultrasonography 07/17/2015. This found the breast density to be category C.there was a subtle area of distortion in the left breast upper outer quadrant which was not palpable on examination. Ultrasonography found no definite correlate. Tomosynthesis guided biopsy was performed 07/27/2015, and showed ([id number]) atypical lobular hyperplasia.  The patient was evaluated by surgery and after appropriate discussion excisional biopsy was performed 09/25/2015. This showed ([id number] [phone]) a complex sclerosing lesion with the usual ductal hyperplasia but also again atypical lobular hyperplasia.  The patient's subsequent history is as detailed below   PAST MEDICAL HISTORY: Past Medical History:  Diagnosis Date  . Anemia   . Anxiety   . Arthritis   . Asthma    seasonal  . Atypical lobular hyperplasia (ALH) of left breast 07/2015  . Cancer ([place])    skin  . Cataract   . Crohn's disease ([place])    severe colonic stricturing  . GERD (gastroesophageal reflux disease)   . Hypertension   . Hypothyroidism   . IUD (intrauterine device) in place 1983   Safety coil per Dr. Valeta Harms note attempted to retrieve but unable to do so  . Osteoporosis 12/2016   T score -2.7 stable from prior DEXA on Prolia  . Thyroid disease    Hypothyroid    PAST SURGICAL HISTORY: Past Surgical History:  Procedure Laterality Date  . Basal and squamous cell Ca excised    . BREAST BIOPSY Left 2018  . BREAST EXCISIONAL BIOPSY Left 2018  . COLONOSCOPY N/A 07/01/2013  Procedure: COLONOSCOPY;  Surgeon: Robin Fair, MD;  Location: [facility];  Service: Endoscopy;  Laterality: N/A;  . COLONOSCOPY    . Fissure surg    . RADIOACTIVE SEED  GUIDED EXCISIONAL BREAST BIOPSY Left 09/25/2015   Procedure: RADIOACTIVE SEED GUIDED EXCISIONAL BREAST BIOPSY;  Surgeon: Robin Bookbinder, MD;  Location: [facility];  Service: General;  Laterality: Left;  . TRIGGER FINGER RELEASE Right 02/01/2016   Procedure: RELEASE TRIGGER FINGER/A-1 PULLEY, right thumb;  Surgeon: Daryll Brod, MD;  Location: [facility];  Service: Orthopedics;  Laterality: Right;  FAB    FAMILY HISTORY Family History  Problem Relation Age of Onset  . Colon cancer Mother   . Heart disease Mother        Atrial fibrillation  . Crohn's disease Mother   . Heart disease Maternal Grandmother   . Hypertension Maternal Grandmother   . Breast cancer Paternal Grandmother        age unknown  . Esophageal cancer Neg Hx   . Liver cancer Neg Hx   . Pancreatic cancer Neg Hx   . Rectal cancer Neg Hx   . Stomach cancer Neg Hx   the patient has no information regarding her father. Her mother passed away in 7440 at 38 years old from small cell lung cancer; she had a history of colon polyps. There are no siblings. There is no history of breast or ovarian cancer in the family.  GYNECOLOGIC HISTORY:  No LMP recorded. Patient is postmenopausal. Robin [name] does not recall when she had her first period. She is GX P0. 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 Her husband Wille Glaser is in the roofing business. At home is just the 2 of them and 2 cats.    ADVANCED DIRECTIVES: in place   HEALTH MAINTENANCE: Social History   Tobacco Use  . Smoking status: Never Smoker  . Smokeless tobacco: Never Used  Substance Use Topics  . Alcohol use: Yes    Alcohol/week: 2.0 standard drinks    Types: 2 Standard drinks or equivalent per week    Comment: Occasional  . Drug use: No     Colonoscopy: UTD/ [facility]  PAP: 12/2017, Fontaine  Bone density: 12/2016, -2.7 (stable,  on prolia)   Allergies  Allergen Reactions  . Codeine Nausea Only    Needs something for nausea to go with pain pill  . Tetracyclines & Related Diarrhea  . Biaxin [Clarithromycin] Palpitations  . Clarithromycin Palpitations    Current Outpatient Medications  Medication Sig Dispense Refill  . Biotin 5 MG CAPS Take 5 mg by mouth daily.     Marland Kitchen denosumab (PROLIA) 60 MG/ML SOLN injection Inject 60 mg into the skin every 6 (six) months. Administer in upper arm, thigh, or abdomen    . famotidine (PEPCID) 20 MG tablet Take 1 tablet (20 mg total) by mouth 2 (two) times daily. 60 tablet 5  . naproxen sodium (ALEVE) 220 MG tablet Take 220 mg by mouth daily as needed.    . sodium chloride 0.9 % SOLN 250 mL with vedolizumab 300 MG SOLR 300 mg Inject 300 mg into the vein every 8 (eight) weeks. After induction phase of week 0,2,6 then every 8 weeks 300 mg 6  . SYNTHROID 75 MCG tablet Take 1 tablet (75 mcg total) by mouth daily before breakfast. 90 tablet 3  . verapamil (VERELAN PM) 180 MG 24  hr capsule Take 2 capsules (360 mg total) by mouth daily. 180 capsule 1   Current Facility-Administered Medications  Medication Dose Route Frequency Provider Last Rate Last Dose  . 0.9 %  sodium chloride infusion  500 mL Intravenous Once Robin [name], Robin Raspberry, MD        OBJECTIVE: middle-aged white woman who appears well Vitals:   07/09/18 1350  BP: 140/66  Pulse: 99  Resp: 18  Temp: 98.6 F (37 C)  SpO2: 98%     Body mass index is 26.99 kg/m.    ECOG FS:0 - Asymptomatic  Sclerae unicteric, pupils round and equal Mask No cervical or supraclavicular adenopathy Lungs no rales or rhonchi Heart regular rate and rhythm Abd soft, nontender, positive bowel sounds MSK no focal spinal tenderness, no upper extremity lymphedema Neuro: nonfocal, well oriented, appropriate affect Breasts: Right breast is benign.  The left breast has undergone lumpectomy.  It is slightly smaller than the right but the cosmetic  result is excellent.  Both axillae are benign.   LAB RESULTS: Outside labs reviewed  CMP     Component Value Date/Time   NA 142 07/09/2018 1329   K 3.8 07/09/2018 1329   CL 107 07/09/2018 1329   CO2 29 07/09/2018 1329   GLUCOSE 110 (H) 07/09/2018 1329   BUN 12 07/09/2018 1329   CREATININE 1.01 (H) 07/09/2018 1329   CALCIUM 9.5 07/09/2018 1329   PROT 7.2 07/09/2018 1329   ALBUMIN 3.6 07/09/2018 1329   AST 25 07/09/2018 1329   ALT 19 07/09/2018 1329   ALKPHOS 57 07/09/2018 1329   BILITOT 0.2 (L) 07/09/2018 1329   GFRNONAA 59 (L) 07/09/2018 1329   GFRAA >60 07/09/2018 1329    INo results found for: SPEP, UPEP  Lab Results  Component Value Date   WBC 9.9 07/09/2018   NEUTROABS 5.9 07/09/2018   HGB 11.4 (L) 07/09/2018   HCT 37.2 07/09/2018   MCV 86.7 07/09/2018   PLT 433 (H) 07/09/2018      Chemistry      Component Value Date/Time   NA 142 07/09/2018 1329   K 3.8 07/09/2018 1329   CL 107 07/09/2018 1329   CO2 29 07/09/2018 1329   BUN 12 07/09/2018 1329   CREATININE 1.01 (H) 07/09/2018 1329      Component Value Date/Time   CALCIUM 9.5 07/09/2018 1329   ALKPHOS 57 07/09/2018 1329   AST 25 07/09/2018 1329   ALT 19 07/09/2018 1329   BILITOT 0.2 (L) 07/09/2018 1329       No results found for: LABCA2  No components found for: UVOZD664  No results for input(s): INR in the last 168 hours.  Urinalysis    Component Value Date/Time   COLORURINE YELLOW 08/16/2015 0006   APPEARANCEUR CLEAR 08/16/2015 0006   LABSPEC 1.022 08/16/2015 0006   PHURINE 5.5 08/16/2015 0006   GLUCOSEU NEGATIVE 08/16/2015 0006   HGBUR LARGE (A) 08/16/2015 0006   BILIRUBINUR NEGATIVE 08/16/2015 0006   KETONESUR NEGATIVE 08/16/2015 0006   PROTEINUR NEGATIVE 08/16/2015 0006   UROBILINOGEN 0.2 12/16/2013 0854   NITRITE NEGATIVE 08/16/2015 0006   LEUKOCYTESUR MODERATE (A) 08/16/2015 0006     STUDIES: No results found.   ELIGIBLE FOR AVAILABLE RESEARCH PROTOCOL: no    ASSESSMENT: 63 y.o. [place], [place] woman Status post left breast upper outer quadrant biopsy 07/27/2015 showing atypical lobular hyperplasia  (1) left lumpectomy 09/25/2015 showed a complex sclerosing lesion and atypical lobular hyperplasia.  (2) Crohn's disease  PLAN: Jaquia is  now nearly 3 years out from surgery for her atypical lobular hyperplasia.  There have been no unusual developments.  Unfortunately her mammogram could not be done before today's visit because of the pandemic  She is scheduled for screening mammography in June.  She understands that there is no need to do diagnostic in her situation  I have encouraged her to start a walking program.  Otherwise she is doing very well and adapting nicely to the current distancing guidelines  I am going to see her again late June of next year after her mammogram in 2021  She had some skin and nail issues that we also discussed.  She knows to call for any other issues that may develop before that visit.   Paddy Neis, Robin Dad, MD  07/09/18 2:27 PM Medical Oncology and Hematology [facility] [address] [place], [place] [address] Tel. [phone]    Fax. [phone]    I, Wilburn Mylar, am acting as scribe for Dr. Virgie [name]. Collins Dimaria.  I, Lurline Del MD, have reviewed the above documentation for accuracy and completeness, and I agree with the above.

## 2018-07-09 ENCOUNTER — Inpatient Hospital Stay: Payer: BLUE CROSS/BLUE SHIELD

## 2018-07-09 ENCOUNTER — Inpatient Hospital Stay: Payer: BLUE CROSS/BLUE SHIELD | Attending: Oncology | Admitting: Oncology

## 2018-07-09 ENCOUNTER — Other Ambulatory Visit: Payer: Self-pay

## 2018-07-09 VITALS — BP 140/66 | HR 99 | Temp 98.6°F | Resp 18 | Ht 65.5 in | Wt 164.7 lb

## 2018-07-09 DIAGNOSIS — Z79899 Other long term (current) drug therapy: Secondary | ICD-10-CM | POA: Diagnosis not present

## 2018-07-09 DIAGNOSIS — I788 Other diseases of capillaries: Secondary | ICD-10-CM | POA: Diagnosis not present

## 2018-07-09 DIAGNOSIS — M818 Other osteoporosis without current pathological fracture: Secondary | ICD-10-CM

## 2018-07-09 DIAGNOSIS — N6092 Unspecified benign mammary dysplasia of left breast: Secondary | ICD-10-CM

## 2018-07-09 DIAGNOSIS — K5090 Crohn's disease, unspecified, without complications: Secondary | ICD-10-CM

## 2018-07-09 DIAGNOSIS — L738 Other specified follicular disorders: Secondary | ICD-10-CM | POA: Diagnosis not present

## 2018-07-09 DIAGNOSIS — D485 Neoplasm of uncertain behavior of skin: Secondary | ICD-10-CM | POA: Diagnosis not present

## 2018-07-09 DIAGNOSIS — I10 Essential (primary) hypertension: Secondary | ICD-10-CM | POA: Insufficient documentation

## 2018-07-09 DIAGNOSIS — I480 Paroxysmal atrial fibrillation: Secondary | ICD-10-CM

## 2018-07-09 DIAGNOSIS — J45909 Unspecified asthma, uncomplicated: Secondary | ICD-10-CM | POA: Insufficient documentation

## 2018-07-09 DIAGNOSIS — E039 Hypothyroidism, unspecified: Secondary | ICD-10-CM

## 2018-07-09 DIAGNOSIS — I4891 Unspecified atrial fibrillation: Secondary | ICD-10-CM | POA: Insufficient documentation

## 2018-07-09 DIAGNOSIS — L570 Actinic keratosis: Secondary | ICD-10-CM | POA: Diagnosis not present

## 2018-07-09 DIAGNOSIS — L821 Other seborrheic keratosis: Secondary | ICD-10-CM | POA: Diagnosis not present

## 2018-07-09 DIAGNOSIS — F419 Anxiety disorder, unspecified: Secondary | ICD-10-CM | POA: Insufficient documentation

## 2018-07-09 DIAGNOSIS — Z85828 Personal history of other malignant neoplasm of skin: Secondary | ICD-10-CM | POA: Diagnosis not present

## 2018-07-09 LAB — CBC WITH DIFFERENTIAL (CANCER CENTER ONLY)
Abs Immature Granulocytes: 0.05 10*3/uL (ref 0.00–0.07)
Basophils Absolute: 0.1 10*3/uL (ref 0.0–0.1)
Basophils Relative: 1 %
Eosinophils Absolute: 0.2 10*3/uL (ref 0.0–0.5)
Eosinophils Relative: 2 %
HCT: 37.2 % (ref 36.0–46.0)
Hemoglobin: 11.4 g/dL — ABNORMAL LOW (ref 12.0–15.0)
Immature Granulocytes: 1 %
Lymphocytes Relative: 29 %
Lymphs Abs: 2.9 10*3/uL (ref 0.7–4.0)
MCH: 26.6 pg (ref 26.0–34.0)
MCHC: 30.6 g/dL (ref 30.0–36.0)
MCV: 86.7 fL (ref 80.0–100.0)
Monocytes Absolute: 0.8 10*3/uL (ref 0.1–1.0)
Monocytes Relative: 8 %
Neutro Abs: 5.9 10*3/uL (ref 1.7–7.7)
Neutrophils Relative %: 59 %
Platelet Count: 433 10*3/uL — ABNORMAL HIGH (ref 150–400)
RBC: 4.29 MIL/uL (ref 3.87–5.11)
RDW: 16.1 % — ABNORMAL HIGH (ref 11.5–15.5)
WBC Count: 9.9 10*3/uL (ref 4.0–10.5)
nRBC: 0 % (ref 0.0–0.2)

## 2018-07-09 LAB — CMP (CANCER CENTER ONLY)
ALT: 19 U/L (ref 0–44)
AST: 25 U/L (ref 15–41)
Albumin: 3.6 g/dL (ref 3.5–5.0)
Alkaline Phosphatase: 57 U/L (ref 38–126)
Anion gap: 6 (ref 5–15)
BUN: 12 mg/dL (ref 8–23)
CO2: 29 mmol/L (ref 22–32)
Calcium: 9.5 mg/dL (ref 8.9–10.3)
Chloride: 107 mmol/L (ref 98–111)
Creatinine: 1.01 mg/dL — ABNORMAL HIGH (ref 0.44–1.00)
GFR, Est AFR Am: >60 mL/min (ref >60)
GFR, Estimated: 59 mL/min — ABNORMAL LOW (ref >60)
Glucose, Bld: 110 mg/dL — ABNORMAL HIGH (ref 70–99)
Potassium: 3.8 mmol/L (ref 3.5–5.1)
Sodium: 142 mmol/L (ref 135–145)
Total Bilirubin: 0.2 mg/dL — ABNORMAL LOW (ref 0.3–1.2)
Total Protein: 7.2 g/dL (ref 6.5–8.1)

## 2018-07-10 DIAGNOSIS — K5090 Crohn's disease, unspecified, without complications: Secondary | ICD-10-CM | POA: Diagnosis not present

## 2018-07-13 ENCOUNTER — Ambulatory Visit (INDEPENDENT_AMBULATORY_CARE_PROVIDER_SITE_OTHER): Payer: BLUE CROSS/BLUE SHIELD | Admitting: Gynecology

## 2018-07-13 ENCOUNTER — Other Ambulatory Visit: Payer: Self-pay

## 2018-07-13 DIAGNOSIS — M810 Age-related osteoporosis without current pathological fracture: Secondary | ICD-10-CM

## 2018-07-13 MED ORDER — DENOSUMAB 60 MG/ML ~~LOC~~ SOSY
60.0000 mg | PREFILLED_SYRINGE | Freq: Once | SUBCUTANEOUS | Status: AC
  Administered 2018-07-13: 12:00:00 60 mg via SUBCUTANEOUS

## 2018-07-16 NOTE — Telephone Encounter (Signed)
[place] 07/13/2018 NEXT INJECTION 01/14/2019

## 2018-07-21 ENCOUNTER — Ambulatory Visit
Admission: RE | Admit: 2018-07-21 | Discharge: 2018-07-21 | Disposition: A | Discharge status: Home / Self Care | Payer: BLUE CROSS/BLUE SHIELD | Source: Ambulatory Visit | Attending: Surgery | Admitting: Surgery

## 2018-07-21 DIAGNOSIS — E042 Nontoxic multinodular goiter: Secondary | ICD-10-CM | POA: Diagnosis not present

## 2018-07-21 DIAGNOSIS — E041 Nontoxic single thyroid nodule: Secondary | ICD-10-CM | POA: Diagnosis not present

## 2018-08-13 ENCOUNTER — Ambulatory Visit
Admission: RE | Admit: 2018-08-13 | Discharge: 2018-08-13 | Disposition: A | Discharge status: Home / Self Care | Payer: BC Managed Care – PPO | Source: Ambulatory Visit | Attending: Internal Medicine | Admitting: Internal Medicine

## 2018-08-13 ENCOUNTER — Other Ambulatory Visit: Payer: Self-pay

## 2018-08-13 DIAGNOSIS — Z1231 Encounter for screening mammogram for malignant neoplasm of breast: Secondary | ICD-10-CM

## 2018-08-14 DIAGNOSIS — K5090 Crohn's disease, unspecified, without complications: Secondary | ICD-10-CM | POA: Diagnosis not present

## 2018-08-24 ENCOUNTER — Other Ambulatory Visit: Payer: Self-pay | Admitting: Cardiology

## 2018-08-24 DIAGNOSIS — I4891 Unspecified atrial fibrillation: Secondary | ICD-10-CM

## 2018-08-24 DIAGNOSIS — I10 Essential (primary) hypertension: Secondary | ICD-10-CM

## 2018-08-25 ENCOUNTER — Telehealth: Payer: Self-pay | Admitting: Cardiology

## 2018-08-25 DIAGNOSIS — I4891 Unspecified atrial fibrillation: Secondary | ICD-10-CM

## 2018-08-25 DIAGNOSIS — I10 Essential (primary) hypertension: Secondary | ICD-10-CM

## 2018-08-25 MED ORDER — VERAPAMIL HCL ER 180 MG PO CP24: 360.0000 mg | ORAL_CAPSULE | Freq: Every day | ORAL | 3 refills | Status: DC

## 2018-08-25 NOTE — Telephone Encounter (Signed)
New Message            Patient is calling to get a Rx rewritten for 90 days "Verapamil", she was told to do two pills and now the Rx is only showing one pill, pls [facility] to [facility] [address] on [address]

## 2018-08-25 NOTE — Telephone Encounter (Signed)
Spoke with pt, Refill sent to the pharmacy electronically.  

## 2018-09-09 DIAGNOSIS — H04123 Dry eye syndrome of bilateral lacrimal glands: Secondary | ICD-10-CM | POA: Diagnosis not present

## 2018-09-09 DIAGNOSIS — H2513 Age-related nuclear cataract, bilateral: Secondary | ICD-10-CM | POA: Diagnosis not present

## 2018-09-09 DIAGNOSIS — H43811 Vitreous degeneration, right eye: Secondary | ICD-10-CM | POA: Diagnosis not present

## 2018-09-09 DIAGNOSIS — H35371 Puckering of macula, right eye: Secondary | ICD-10-CM | POA: Diagnosis not present

## 2018-09-11 DIAGNOSIS — K5090 Crohn's disease, unspecified, without complications: Secondary | ICD-10-CM | POA: Diagnosis not present

## 2018-09-16 ENCOUNTER — Encounter: Payer: Self-pay | Admitting: Gynecology

## 2018-10-01 DIAGNOSIS — H35349 Macular cyst, hole, or pseudohole, unspecified eye: Secondary | ICD-10-CM | POA: Diagnosis not present

## 2018-10-01 DIAGNOSIS — N62 Hypertrophy of breast: Secondary | ICD-10-CM | POA: Diagnosis not present

## 2018-10-01 DIAGNOSIS — Z79899 Other long term (current) drug therapy: Secondary | ICD-10-CM | POA: Diagnosis not present

## 2018-10-01 DIAGNOSIS — K50118 Crohn's disease of large intestine with other complication: Secondary | ICD-10-CM | POA: Diagnosis not present

## 2018-10-08 DIAGNOSIS — L814 Other melanin hyperpigmentation: Secondary | ICD-10-CM | POA: Diagnosis not present

## 2018-10-08 DIAGNOSIS — Z85828 Personal history of other malignant neoplasm of skin: Secondary | ICD-10-CM | POA: Diagnosis not present

## 2018-10-08 DIAGNOSIS — L821 Other seborrheic keratosis: Secondary | ICD-10-CM | POA: Diagnosis not present

## 2018-10-08 DIAGNOSIS — D485 Neoplasm of uncertain behavior of skin: Secondary | ICD-10-CM | POA: Diagnosis not present

## 2018-10-08 DIAGNOSIS — L570 Actinic keratosis: Secondary | ICD-10-CM | POA: Diagnosis not present

## 2018-10-15 DIAGNOSIS — K5090 Crohn's disease, unspecified, without complications: Secondary | ICD-10-CM | POA: Diagnosis not present

## 2018-11-13 DIAGNOSIS — K5090 Crohn's disease, unspecified, without complications: Secondary | ICD-10-CM | POA: Diagnosis not present

## 2018-11-24 ENCOUNTER — Encounter: Payer: Self-pay | Admitting: Gynecology

## 2018-12-03 DIAGNOSIS — E042 Nontoxic multinodular goiter: Secondary | ICD-10-CM | POA: Diagnosis not present

## 2018-12-11 DIAGNOSIS — K5090 Crohn's disease, unspecified, without complications: Secondary | ICD-10-CM | POA: Diagnosis not present

## 2018-12-21 DIAGNOSIS — H04123 Dry eye syndrome of bilateral lacrimal glands: Secondary | ICD-10-CM | POA: Diagnosis not present

## 2018-12-21 DIAGNOSIS — H35371 Puckering of macula, right eye: Secondary | ICD-10-CM | POA: Diagnosis not present

## 2018-12-21 DIAGNOSIS — H5203 Hypermetropia, bilateral: Secondary | ICD-10-CM | POA: Diagnosis not present

## 2018-12-21 DIAGNOSIS — H43811 Vitreous degeneration, right eye: Secondary | ICD-10-CM | POA: Diagnosis not present

## 2018-12-21 DIAGNOSIS — H2513 Age-related nuclear cataract, bilateral: Secondary | ICD-10-CM | POA: Diagnosis not present

## 2018-12-21 DIAGNOSIS — H524 Presbyopia: Secondary | ICD-10-CM | POA: Diagnosis not present

## 2019-01-04 ENCOUNTER — Other Ambulatory Visit: Payer: Self-pay

## 2019-01-05 ENCOUNTER — Encounter: Payer: Self-pay | Admitting: Gynecology

## 2019-01-05 ENCOUNTER — Ambulatory Visit: Payer: BC Managed Care – PPO | Admitting: Gynecology

## 2019-01-05 ENCOUNTER — Telehealth: Payer: Self-pay | Admitting: *Deleted

## 2019-01-05 VITALS — BP 124/82 | Ht 65.0 in | Wt 167.0 lb

## 2019-01-05 DIAGNOSIS — M810 Age-related osteoporosis without current pathological fracture: Secondary | ICD-10-CM

## 2019-01-05 DIAGNOSIS — N952 Postmenopausal atrophic vaginitis: Secondary | ICD-10-CM | POA: Diagnosis not present

## 2019-01-05 DIAGNOSIS — Z01419 Encounter for gynecological examination (general) (routine) without abnormal findings: Secondary | ICD-10-CM

## 2019-01-05 NOTE — Telephone Encounter (Signed)
Prolia insurance verification has been sent awaiting Summary of benefits

## 2019-01-05 NOTE — Progress Notes (Signed)
    Robin [name] 26-Sep-1955 [id number]        63 y.o.  G0P0 for annual gynecologic exam.  Without gynecologic complaints  Past medical history,surgical history, problem list, medications, allergies, family history and social history were all reviewed and documented as reviewed in the EPIC chart.  ROS:  Performed with pertinent positives and negatives included in the history, assessment and plan.   Additional significant findings : None   Exam: [name] assistant Vitals:   01/05/19 0902  BP: 124/82  Weight: 167 lb (75.8 kg)  Height: 5' 5"  (1.651 m)   Body mass index is 27.79 kg/m.  General appearance:  Normal affect, orientation and appearance. Skin: Grossly normal HEENT: Without gross lesions.  No cervical or supraclavicular adenopathy. Thyroid normal.  Lungs:  Clear without wheezing, rales or rhonchi Cardiac: RR, without RMG Abdominal:  Soft, nontender, without masses, guarding, rebound, organomegaly or hernia Breasts:  Examined lying and sitting without masses, retractions, discharge or axillary adenopathy. Pelvic:  Ext, BUS, Vagina: Normal with atrophic changes  Cervix: Normal with atrophic changes  Uterus: Anteverted, normal size, shape and contour, midline and mobile nontender   Adnexa: Without masses or tenderness    Anus and perineum: Normal   Rectovaginal: Normal sphincter tone without palpated masses or tenderness.    Assessment/Plan:  63 y.o. G0P0 female for annual gynecologic exam.   1. Postmenopausal.  No significant menopausal symptoms or any vaginal bleeding. 2. Osteoporosis.  DEXA 2018 T score -2.7 stable from prior DEXA.  Currently on Prolia for 7 years.  Doing well with this.  We will continue on Prolia this year and repeat her bone density now at 2-year interval. 3. History of safety coil 1983.  Dr. Cherylann Banas was unable to remove it previously.  Recent CT scan demonstrated intrauterine placement.  We again discussed the issues of trying to remove it  versus letting it stay in place.  As she is not having any issues with it my recommendation would be to leave it in place avoiding complications trying to remove it.  Patient agrees with the plan. 4. Mammography 07/2018.  Continue with annual mammography when due.  Breast exam normal today. 5. Pap smear 2019.  No Pap smear done today.  No history of significant abnormal Pap smears.  Plan repeat Pap smear at 3-year interval per current screening guidelines. 6. Colonoscopy 2019.  Repeat at their recommended interval. 7. Health maintenance.  No routine lab work done as patient does this elsewhere.   Anastasio Auerbach MD, 9:44 AM 01/05/2019

## 2019-01-05 NOTE — Patient Instructions (Addendum)
Continue on Prolia every 6 months.  Follow-up for the bone density as scheduled.  Follow-up in 1 year for annual exam, sooner as needed.

## 2019-01-08 DIAGNOSIS — Z85828 Personal history of other malignant neoplasm of skin: Secondary | ICD-10-CM | POA: Diagnosis not present

## 2019-01-08 DIAGNOSIS — L570 Actinic keratosis: Secondary | ICD-10-CM | POA: Diagnosis not present

## 2019-01-08 DIAGNOSIS — K5090 Crohn's disease, unspecified, without complications: Secondary | ICD-10-CM | POA: Diagnosis not present

## 2019-01-08 DIAGNOSIS — D0471 Carcinoma in situ of skin of right lower limb, including hip: Secondary | ICD-10-CM | POA: Diagnosis not present

## 2019-01-08 DIAGNOSIS — L308 Other specified dermatitis: Secondary | ICD-10-CM | POA: Diagnosis not present

## 2019-01-08 DIAGNOSIS — D1721 Benign lipomatous neoplasm of skin and subcutaneous tissue of right arm: Secondary | ICD-10-CM | POA: Diagnosis not present

## 2019-01-13 IMAGING — CT CT ENTEROGRAPHY (ABD-PELV W/ CM)
2 of 6 series · 15 of 46 positions shown, 17 images · IV contrast (ISOVUE 300)
Comparison: None.

CLINICAL DATA: Crohn's disease, with stricture on colonoscopy.

EXAM:
CT ABDOMEN AND PELVIS WITH CONTRAST (ENTEROGRAPHY)
TECHNIQUE: Multidetector CT of the abdomen and pelvis during bolus
administration of intravenous contrast. Negative oral contrast was
given.
CONTRAST:  100mL FQ48M6-WNN IOPAMIDOL (FQ48M6-WNN) INJECTION 61%

[Series 4: entero thins · axial · 0.73mm/px · z∈[-541,-79]mm · 12 of 259 slices shown, 14 images]
[im 14/259  soft-tissue]
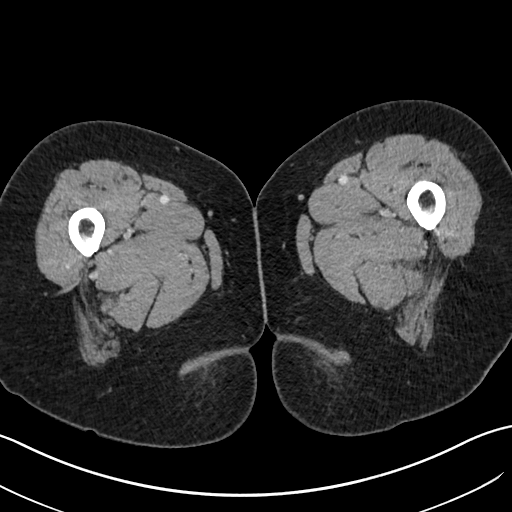
[im 14/259  bone]
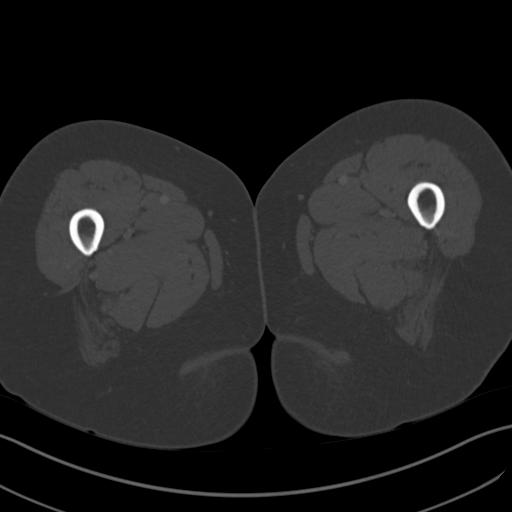
[im 41/259  soft-tissue]
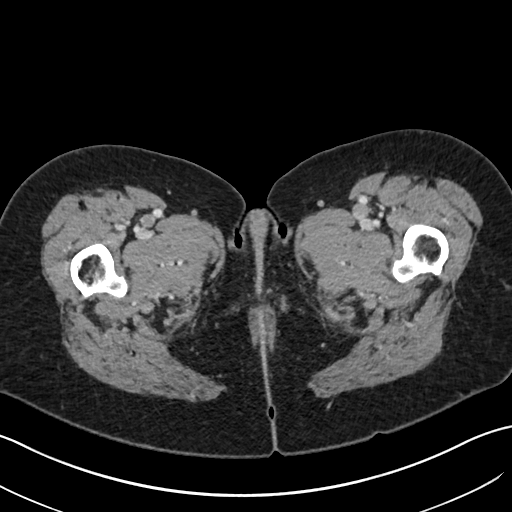
[im 55/259  soft-tissue]
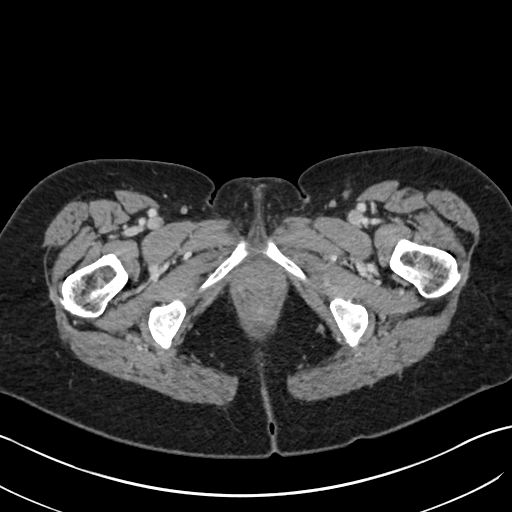
[im 82/259  soft-tissue]
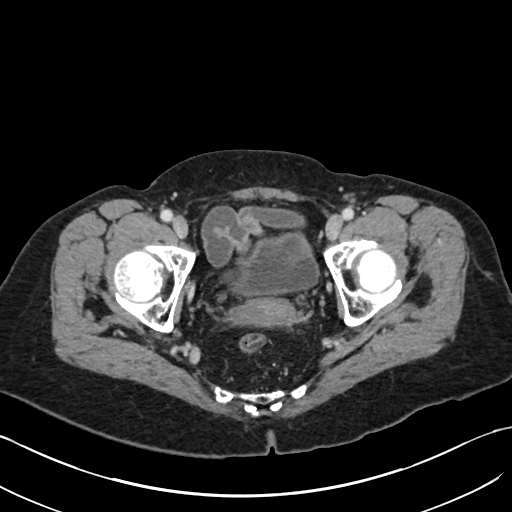
[im 96/259  soft-tissue]
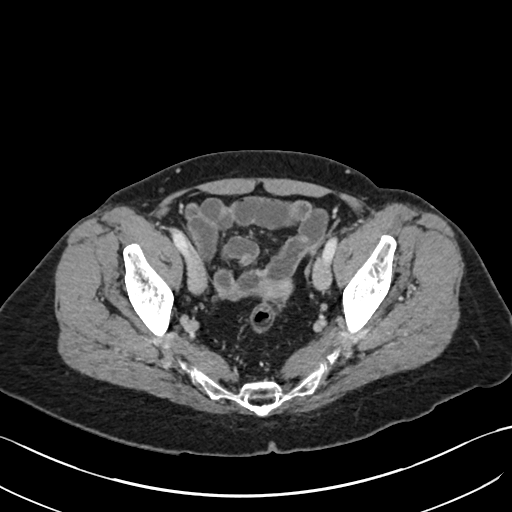
[im 123/259  soft-tissue]
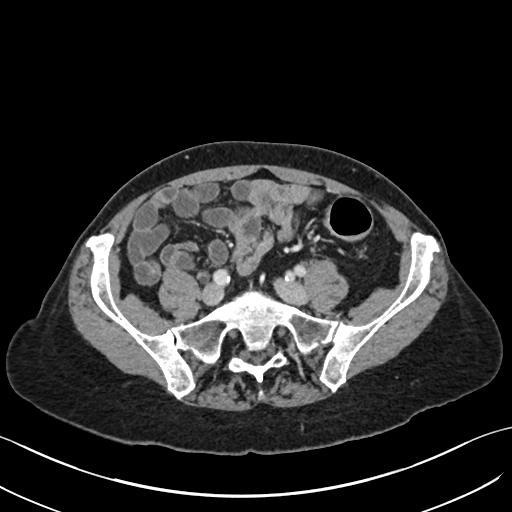
[im 136/259  soft-tissue]
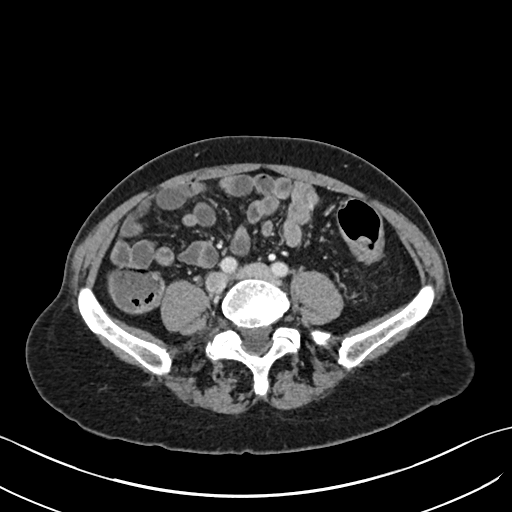
[im 163/259  soft-tissue]
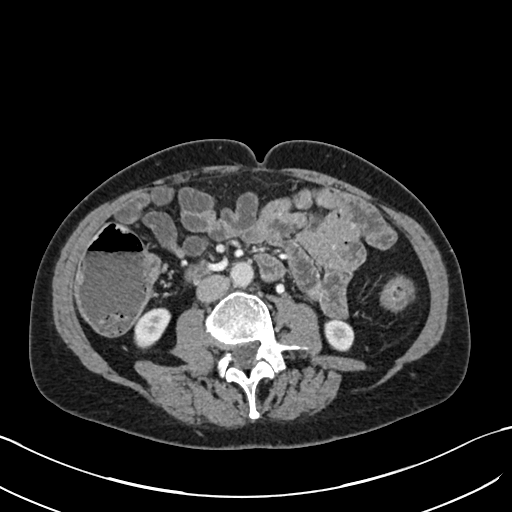
[im 177/259  soft-tissue]
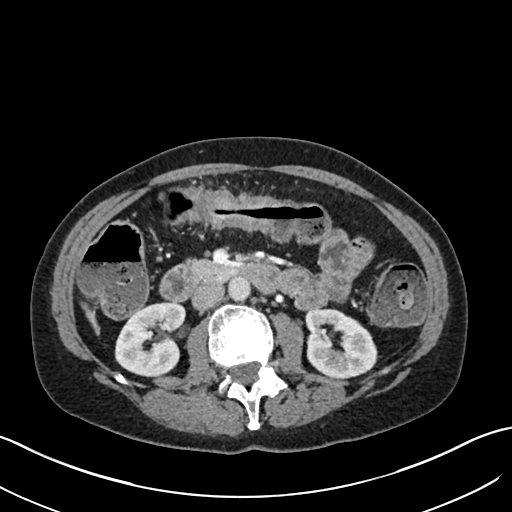
[im 177/259  bone]
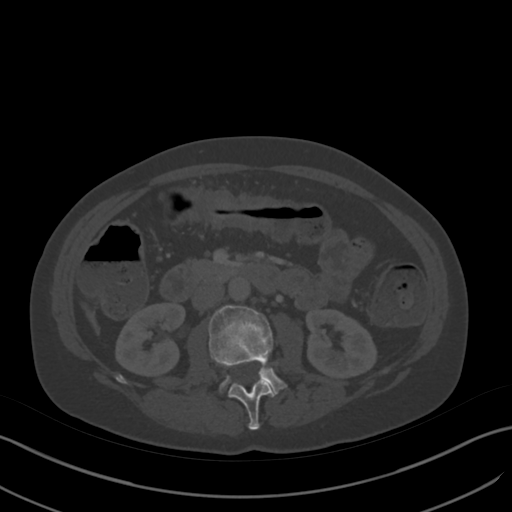
[im 204/259  soft-tissue]
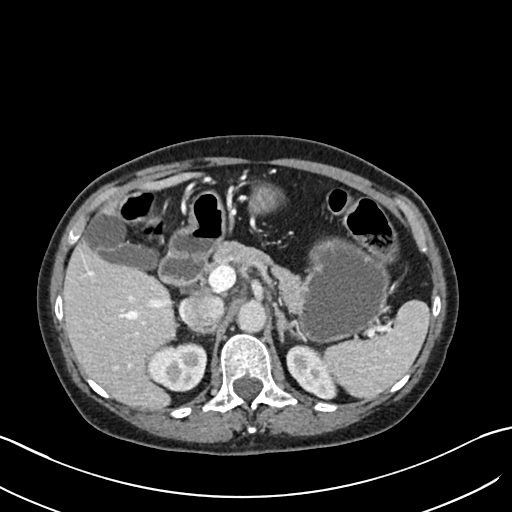
[im 218/259  soft-tissue]
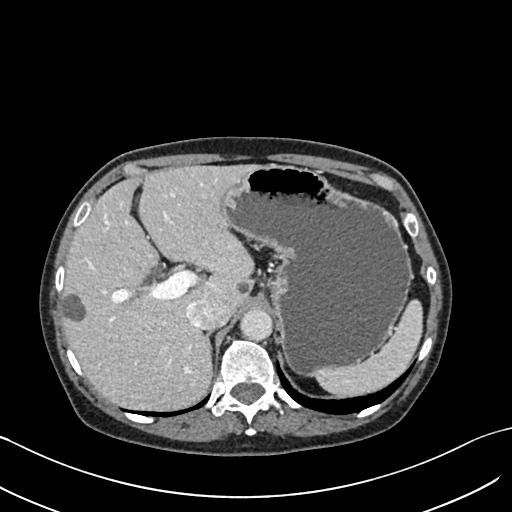
[im 245/259  soft-tissue]
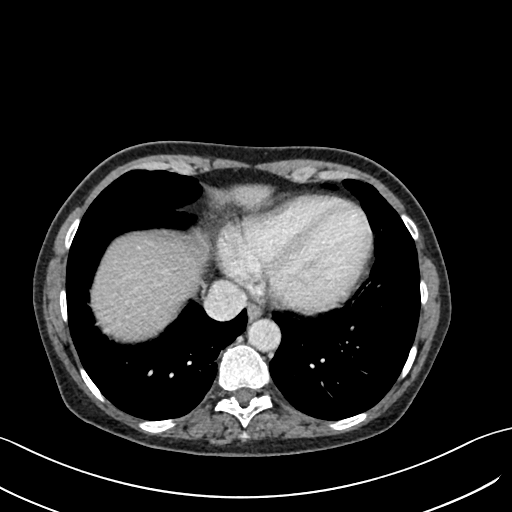

[Series 7: coronal · coronal · 0.64mm/px · 3 of 74 slices shown]
[im 25/74  soft-tissue]
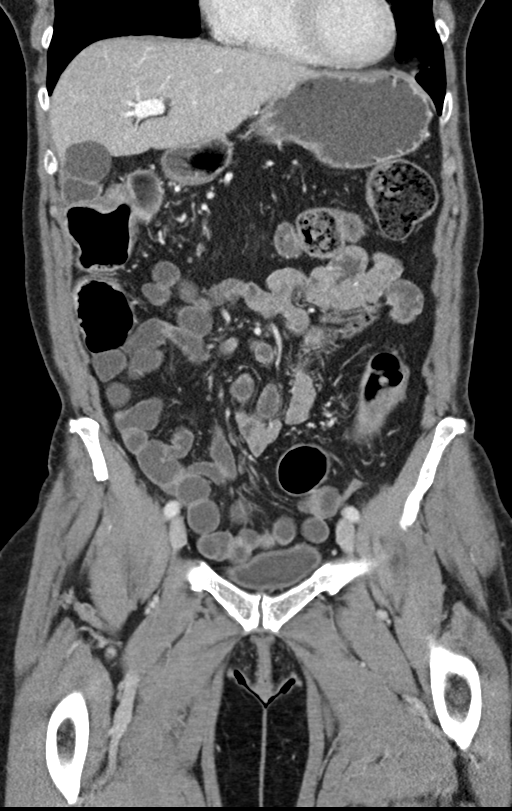
[im 33/74  soft-tissue]
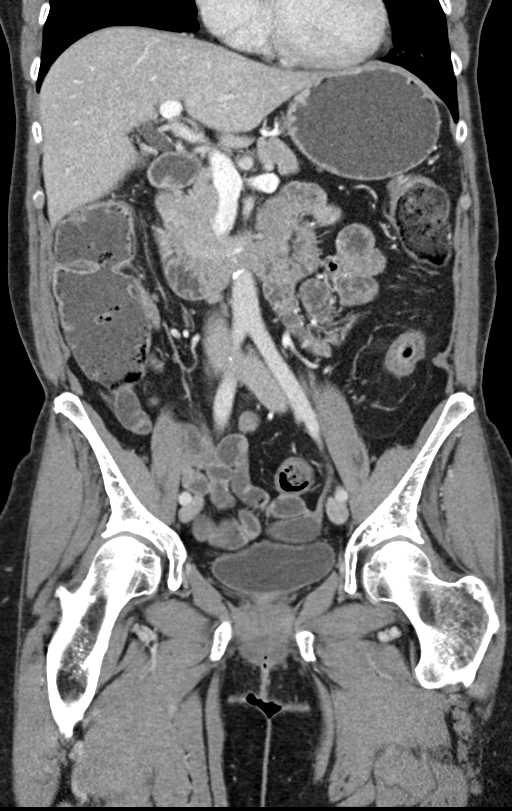
[im 41/74  soft-tissue]
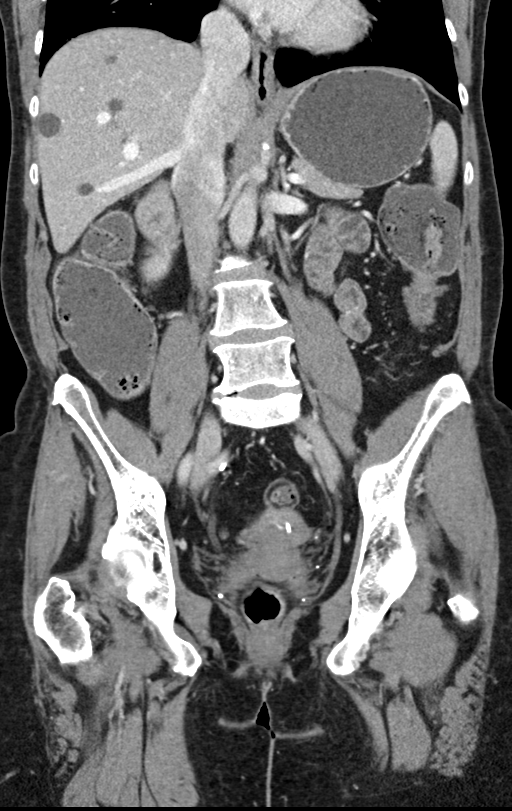

[15 of 46 positions shown; findings below may reference images not displayed]

FINDINGS: Lower chest:  Lung bases are essentially clear.

Hepatobiliary: Scattered hepatic cysts, measuring up to 3.0 cm
inferiorly in segment 4B (series 2/image 21).

Gallbladder is unremarkable. No intrahepatic or extrahepatic ductal
dilatation.

Pancreas: Within normal limits.

Spleen: Within normal limits.

Adrenals/Urinary Tract: Adrenal glands are within normal limits.

Kidneys are within normal limits.  No hydronephrosis.

Bladder is within normal limits.

Stomach/Bowel: Stomach is within normal limits.

Status post ileocecal resection. Neoterminal ileum is within normal
limits (series 2/image 44).

No small bowel wall thickening or mass is seen. No evidence of small
bowel obstruction.

Status post appendectomy.

Long segment wall thickening/stricture involving the descending
colon (coronal image 38), likely accounting for the finding on
colonoscopy.

Additional medium segment wall thickening with mild
narrowing/stricture involving the mid transverse colon (series
2/image 33).

No evidence of pericolonic fistula or abscess.

Vascular/Lymphatic: No evidence of abdominal aortic aneurysm.

Mild atherosclerotic calcifications the abdominal aorta and branch
vessels.

No suspicious abdominopelvic lymphadenopathy.

Reproductive: Uterus is notable for an IUD in satisfactory position.

Left ovary is within normal limits.  No right adnexal mass.

Other: No abdominopelvic ascites.

Tiny fat containing right inguinal hernia.

Musculoskeletal: Mild superior endplate compression fracture
deformity at L3, unchanged from 5003.
IMPRESSION: Status post ileocecal resection. No small bowel wall thickening or
mass is seen. No evidence of small bowel obstruction.

Long segment wall thickening/stricture involving the descending
colon. Additional medial segment wall thickening with mild
narrowing/stricture involving the mid transverse colon. These
findings are compatible with patient's known stricturing Crohn's
disease.

No evidence of pericolonic fistula or abscess.

## 2019-01-26 NOTE — Telephone Encounter (Signed)
Deductible Amount met   Annual exam 03/06/2018 TF  Calcium 9.5       Date 07/09/2018  Upcoming dental procedures   Prior Authorization needed yes but on file  Pt estimated Cost $0   Appt 01/28/2019 @ 9:00    Coverage Details: 0% one doe,0% admin fee

## 2019-01-28 ENCOUNTER — Ambulatory Visit (INDEPENDENT_AMBULATORY_CARE_PROVIDER_SITE_OTHER): Payer: BC Managed Care – PPO | Admitting: Gynecology

## 2019-01-28 ENCOUNTER — Other Ambulatory Visit: Payer: Self-pay

## 2019-01-28 DIAGNOSIS — M810 Age-related osteoporosis without current pathological fracture: Secondary | ICD-10-CM

## 2019-01-28 MED ORDER — DENOSUMAB 60 MG/ML ~~LOC~~ SOSY
60.0000 mg | PREFILLED_SYRINGE | Freq: Once | SUBCUTANEOUS | Status: AC
  Administered 2019-01-28: 60 mg via SUBCUTANEOUS

## 2019-02-04 ENCOUNTER — Telehealth: Payer: Self-pay | Admitting: Gastroenterology

## 2019-02-04 NOTE — Telephone Encounter (Signed)
Faxed order to [facility] for Entyvio-314m IV every 4 weeks(per Dr.Armbruster's phone note on 02/27/18)

## 2019-02-05 DIAGNOSIS — K5090 Crohn's disease, unspecified, without complications: Secondary | ICD-10-CM | POA: Diagnosis not present

## 2019-03-05 ENCOUNTER — Other Ambulatory Visit: Payer: Self-pay

## 2019-03-05 ENCOUNTER — Telehealth: Payer: Self-pay

## 2019-03-05 ENCOUNTER — Telehealth: Payer: Self-pay | Admitting: Gastroenterology

## 2019-03-05 DIAGNOSIS — R1012 Left upper quadrant pain: Secondary | ICD-10-CM

## 2019-03-05 NOTE — Telephone Encounter (Signed)
Called patient back,  and she states she has a pressure below her left ribs that is very uncomfortable and buffy. That it has gotten worse the last 2 weeks. States it is not really pain and she has no other symptoms. It is not tender to the touch. Please advise

## 2019-03-05 NOTE — Telephone Encounter (Signed)
Pt reported that she is experiencing LUQ pain.  Please advise.

## 2019-03-05 NOTE — Telephone Encounter (Signed)
Patient does not have any other symptoms, no fever, abd pain or any other Crohn's symptoms. She doesn't remember how long ago she had labs at [facility]. I called them and they are closed. Will try again Monday.

## 2019-03-05 NOTE — Telephone Encounter (Signed)
[facility] to see if patient has had recent lab there. Office is closed for the day. Will try Monday

## 2019-03-05 NOTE — Telephone Encounter (Signed)
Hard to say what this is just based on description over the phone. In looking through the chart she has complained of LUQ pain in the past. Hopefully her Crohn's is well controlled on her regimen. I have not seen her in over a year, would be good to make a office follow up for her. If she is not nauseated / vomiting / and bowels okay, then hopefully not related to her Crohn's. Hopefully no fever. If none of those things I would monitor and see how she does over the weekend. I think she has had baseline labs by the provider who infuses her [facility], however is she has not had labs in recent months we can do that (CBC, CMET). If she does have fever, vomiting, bowel changes otherwise or worsens in the interim she should let us know. Thanks

## 2019-03-08 ENCOUNTER — Telehealth: Payer: Self-pay

## 2019-03-08 NOTE — Telephone Encounter (Signed)
Okay thanks for letting me know

## 2019-03-08 NOTE — Telephone Encounter (Signed)
[facility] and patient has not had labs there since Oct. 2020. Called patient and requested she come into our lab for CBC and CMET. She wants it done at [facility], so she only gets stuck once. She is scheduled for a Entyvio infusion on 03/19/19 and they agreed to do the labs then

## 2019-03-10 ENCOUNTER — Other Ambulatory Visit: Payer: Self-pay

## 2019-03-11 ENCOUNTER — Other Ambulatory Visit: Payer: Self-pay | Admitting: Gynecology

## 2019-03-11 ENCOUNTER — Other Ambulatory Visit: Payer: Self-pay | Admitting: Obstetrics & Gynecology

## 2019-03-11 ENCOUNTER — Other Ambulatory Visit: Payer: Self-pay | Admitting: *Deleted

## 2019-03-11 ENCOUNTER — Ambulatory Visit (INDEPENDENT_AMBULATORY_CARE_PROVIDER_SITE_OTHER): Payer: BC Managed Care – PPO

## 2019-03-11 DIAGNOSIS — M810 Age-related osteoporosis without current pathological fracture: Secondary | ICD-10-CM

## 2019-03-11 DIAGNOSIS — Z780 Asymptomatic menopausal state: Secondary | ICD-10-CM

## 2019-03-18 NOTE — Telephone Encounter (Signed)
PROLIA GIVEN 01/28/2019 NEXT INJECTION 07/30/2019

## 2019-03-19 DIAGNOSIS — K5090 Crohn's disease, unspecified, without complications: Secondary | ICD-10-CM | POA: Diagnosis not present

## 2019-04-01 ENCOUNTER — Telehealth: Payer: Self-pay | Admitting: *Deleted

## 2019-04-01 NOTE — Telephone Encounter (Signed)
Patient received her recent dexa results 1/14/21scanned in chart in mail currently on Prolia doing well. She wanted to confirm no new bone loss? She saw on report on the 1st sheet it mentioned "right femoral neck -3.0" and the area under comparison notes increase in bone mineral density T-score at total right hip. She worried something is wrong with her neck and hip. Please advise

## 2019-04-01 NOTE — Telephone Encounter (Signed)
That is exactly right that looks like the total right hip showed an increase in bone mineral density with a score changing from -1.8 in 2018 to -1.5 on this most recent study.  However, the right femoral neck itself indicated some loss, but overall there is improvement in the right hip.

## 2019-04-02 NOTE — Telephone Encounter (Signed)
Patient informed. 

## 2019-04-02 NOTE — Telephone Encounter (Signed)
Left message for patient to call.

## 2019-04-08 ENCOUNTER — Encounter: Payer: Self-pay | Admitting: Gynecology

## 2019-04-12 DIAGNOSIS — L821 Other seborrheic keratosis: Secondary | ICD-10-CM | POA: Diagnosis not present

## 2019-04-12 DIAGNOSIS — Z85828 Personal history of other malignant neoplasm of skin: Secondary | ICD-10-CM | POA: Diagnosis not present

## 2019-04-12 DIAGNOSIS — D225 Melanocytic nevi of trunk: Secondary | ICD-10-CM | POA: Diagnosis not present

## 2019-04-12 DIAGNOSIS — C44722 Squamous cell carcinoma of skin of right lower limb, including hip: Secondary | ICD-10-CM | POA: Diagnosis not present

## 2019-04-12 DIAGNOSIS — L570 Actinic keratosis: Secondary | ICD-10-CM | POA: Diagnosis not present

## 2019-04-12 DIAGNOSIS — L814 Other melanin hyperpigmentation: Secondary | ICD-10-CM | POA: Diagnosis not present

## 2019-04-22 DIAGNOSIS — K5090 Crohn's disease, unspecified, without complications: Secondary | ICD-10-CM | POA: Diagnosis not present

## 2019-05-07 ENCOUNTER — Ambulatory Visit: Payer: BC Managed Care – PPO | Attending: Internal Medicine

## 2019-05-07 DIAGNOSIS — Z23 Encounter for immunization: Secondary | ICD-10-CM

## 2019-05-07 NOTE — Progress Notes (Signed)
   Covid-19 Vaccination Clinic  Name:  TIERRIA WATSON    MRN: [id number] DOB: 12/22/1955  05/07/2019  Ms. Craton was observed post Covid-19 immunization for 15 minutes without incident. She was provided with Vaccine Information Sheet and instruction to access the V-Safe system.   Ms. Stewart was instructed to call 911 with any severe reactions post vaccine: Marland Kitchen Difficulty breathing  . Swelling of face and throat  . A fast heartbeat  . A bad rash all over body  . Dizziness and weakness   Immunizations Administered    Name Date Dose VIS Date Route   Pfizer COVID-19 Vaccine 05/07/2019  2:15 PM 0.3 mL 02/05/2019 Intramuscular   Manufacturer: [place]   Lot: [id number]   [place]: [id number]

## 2019-05-21 DIAGNOSIS — K5090 Crohn's disease, unspecified, without complications: Secondary | ICD-10-CM | POA: Diagnosis not present

## 2019-05-31 ENCOUNTER — Ambulatory Visit: Payer: BC Managed Care – PPO | Attending: Internal Medicine

## 2019-05-31 DIAGNOSIS — Z23 Encounter for immunization: Secondary | ICD-10-CM

## 2019-05-31 NOTE — Progress Notes (Signed)
   Covid-19 Vaccination Clinic  Name:  Robin [name]    MRN: [id number] DOB: 04/15/1955  05/31/2019  [name] was observed post Covid-19 immunization for 15 minutes without incident. She was provided with Vaccine Information Sheet and instruction to access the V-Safe system.   [name] was instructed to call 911 with any severe reactions post vaccine: Marland Kitchen Difficulty breathing  . Swelling of face and throat  . A fast heartbeat  . A bad rash all over body  . Dizziness and weakness   Immunizations Administered    Name Date Dose VIS Date Route   Pfizer COVID-19 Vaccine 05/31/2019  4:03 PM 0.3 mL 02/05/2019 Intramuscular   Manufacturer: [place]   Lot: [id number]   [place]: [id number]

## 2019-06-18 ENCOUNTER — Ambulatory Visit (INDEPENDENT_AMBULATORY_CARE_PROVIDER_SITE_OTHER): Payer: BC Managed Care – PPO | Admitting: *Deleted

## 2019-06-18 ENCOUNTER — Other Ambulatory Visit: Payer: Self-pay

## 2019-06-18 ENCOUNTER — Telehealth: Payer: Self-pay | Admitting: Cardiology

## 2019-06-18 VITALS — BP 170/80 | HR 78 | Ht 65.0 in | Wt 171.0 lb

## 2019-06-18 DIAGNOSIS — I10 Essential (primary) hypertension: Secondary | ICD-10-CM | POA: Diagnosis not present

## 2019-06-18 DIAGNOSIS — I4891 Unspecified atrial fibrillation: Secondary | ICD-10-CM | POA: Diagnosis not present

## 2019-06-18 DIAGNOSIS — K5090 Crohn's disease, unspecified, without complications: Secondary | ICD-10-CM | POA: Diagnosis not present

## 2019-06-18 NOTE — Progress Notes (Signed)
1.) Reason for visit: ECG and elevated blood pressure  2.) Name of MD requesting visit: crenshaw  3.) H&P: hx of artial fib and hypertension  4.) ROS related to problem: patient called and reported elevated heart rate and elevated bp after her infusion for crohn's today.  5.) Assessment and plan per MD: patient here and reports anxiety and stress related to taking care of her husband. She appears anxious and is talking fast. Her blood pressure elevated here in the office, she does not have a way of checking it at home. She also feels like her heart is racing when the ECG is ran, it shows sinus rhythm with a rate of 78. Reassurance given to patient. She will purchase a bp cuff and bp log given to the patient. She will check her bp once daily and record the readings and pulse and bring to follow up appt in may with dr Stanford Breed. Patient agreed with this plan and also agreed to contact her medical doctor regarding something for her anxiety. She was feeling better when she left the office.

## 2019-06-18 NOTE — Telephone Encounter (Signed)
Spoke with pt, she reports having a lot of stress and anxiety. She is not able to check her bp at [place] and she feels her heart is racing and can not check her pulse. She is going out of town tomorrow, she will come to the office today for ecg and bp check.

## 2019-06-18 NOTE — Telephone Encounter (Signed)
Pt c/o BP issue: STAT if pt c/o blurred vision, one-sided weakness or slurred speech  1. What are your last 5 BP readings? *134/86, 150/92- pt just had her infusion for Crohn- she was told to call Dr Stanford Breed, because her blood pressure was up  2. Are you having any other symptoms (ex. Dizziness, headache, blurred vision, passed out)? Tired, and feels llike her heart is racing  3. What is your BP issue?  Blood pressure  Is running high

## 2019-06-28 NOTE — Progress Notes (Signed)
HPI: Follow-up atrial fibrillation. Patient seen in the emergency room January 2016 with palpitations. She had an electrocardiogram that demonstrated fibrillation and converted spontaneously to sinus rhythm. She had an event monitor1/16that did not show evidence of atrial fibrillation. Her echocardiogram in February 2016 showed normal LV function, mild left ventricular hypertrophy, atrial septum aneurysmal.Exercise treadmill April 2017 with no ST changes.Apixaban recommended previously but pt declined. Since last seen,there is no dyspnea, chest pain, palpitations or syncope.  Current Outpatient Medications  Medication Sig Dispense Refill  . denosumab (PROLIA) 60 MG/ML SOLN injection Inject 60 mg into the skin every 6 (six) months. Administer in upper arm, thigh, or abdomen    . naproxen sodium (ALEVE) 220 MG tablet Take 220 mg by mouth daily as needed.    Marland Kitchen SYNTHROID 75 MCG tablet Take 1 tablet (75 mcg total) by mouth daily before breakfast. 90 tablet 3  . vedolizumab (ENTYVIO) 300 MG injection Inject into the vein.    . verapamil (VERELAN PM) 180 MG 24 hr capsule Take 2 capsules (360 mg total) by mouth daily. 180 capsule 3   Current Facility-Administered Medications  Medication Dose Route Frequency Provider Last Rate Last Admin  . 0.9 %  sodium chloride infusion  500 mL Intravenous Once Armbruster, Carlota Raspberry, MD         Past Medical History:  Diagnosis Date  . Anemia   . Anxiety   . Arthritis   . Asthma    seasonal  . Atypical lobular hyperplasia (ALH) of left breast 07/2015  . Cancer ([place])    skin  . Cataract   . Crohn's disease ([place])    severe colonic stricturing  . GERD (gastroesophageal reflux disease)   . Hypertension   . Hypothyroidism   . IUD (intrauterine device) in place 1983   Safety coil per Dr. Valeta Harms note attempted to retrieve but unable to do so  . Osteoporosis 12/2016   T score -2.7 stable from prior DEXA on Prolia  . Thyroid disease    Hypothyroid    Past Surgical History:  Procedure Laterality Date  . Basal and squamous cell Ca excised    . BREAST BIOPSY Left 2018  . BREAST EXCISIONAL BIOPSY Left 2018  . COLONOSCOPY N/A 07/01/2013   Procedure: COLONOSCOPY;  Surgeon: Garlan Fair, MD;  Location: [facility];  Service: Endoscopy;  Laterality: N/A;  . COLONOSCOPY    . Fissure surg    . RADIOACTIVE SEED GUIDED EXCISIONAL BREAST BIOPSY Left 09/25/2015   Procedure: RADIOACTIVE SEED GUIDED EXCISIONAL BREAST BIOPSY;  Surgeon: Rolm Bookbinder, MD;  Location: [facility];  Service: General;  Laterality: Left;  . TRIGGER FINGER RELEASE Right 02/01/2016   Procedure: RELEASE TRIGGER FINGER/A-1 PULLEY, right thumb;  Surgeon: Daryll Brod, MD;  Location: [facility];  Service: Orthopedics;  Laterality: Right;  FAB    Social History   Socioeconomic History  . Marital status: Married    Spouse name: Not on file  . Number of children: Not on file  . Years of education: Not on file  . Highest education level: Not on file  Occupational History  . Not on file  Tobacco Use  . Smoking status: Never Smoker  . Smokeless tobacco: Never Used  Substance and Sexual Activity  . Alcohol use: Yes    Comment: Occasional  . Drug use: No  . Sexual activity: Yes    Birth control/protection: Post-menopausal    Comment: Pt. declined sexual hx questions  Other Topics Concern  . Not on file  Social History Narrative  . Not on file   Social Determinants of Health   Financial Resource Strain:   . Difficulty of Paying Living Expenses:   Food Insecurity:   . Worried About Charity fundraiser in the Last Year:   . Arboriculturist in the Last Year:   Transportation Needs:   . Film/video editor (Medical):   Marland Kitchen Lack of Transportation (Non-Medical):   Physical Activity:   . Days of Exercise per Week:   . Minutes of Exercise per Session:   Stress:   . Feeling of Stress :   Social Connections:   .  Frequency of Communication with Friends and Family:   . Frequency of Social Gatherings with Friends and Family:   . Attends Religious Services:   . Active Member of Clubs or Organizations:   . Attends Archivist Meetings:   Marland Kitchen Marital Status:   Intimate Partner Violence:   . Fear of Current or Ex-Partner:   . Emotionally Abused:   Marland Kitchen Physically Abused:   . Sexually Abused:     Family History  Problem Relation Age of Onset  . Colon cancer Mother   . Heart disease Mother        Atrial fibrillation  . Crohn's disease Mother   . Heart disease Maternal Grandmother   . Hypertension Maternal Grandmother   . Breast cancer Paternal Grandmother        age unknown  . Esophageal cancer Neg Hx   . Liver cancer Neg Hx   . Pancreatic cancer Neg Hx   . Rectal cancer Neg Hx   . Stomach cancer Neg Hx     ROS: no fevers or chills, productive cough, hemoptysis, dysphasia, odynophagia, melena, hematochezia, dysuria, hematuria, rash, seizure activity, orthopnea, PND, pedal edema, claudication. Remaining systems are negative.  Physical Exam: Well-developed well-nourished in no acute distress.  Skin is warm and dry.  HEENT is normal.  Neck is supple.  Chest is clear to auscultation with normal expansion.  Cardiovascular exam is regular rate and rhythm.  Abdominal exam nontender or distended. No masses palpated. Extremities show no edema. neuro grossly intact  A/P  1 paroxysmal atrial fibrillation-no recurrences.  Continue verapamil at present dose. CHADSvasc-2.  Patient continues to decline anticoagulation and understands the higher risk of embolic event including CVA.  She does have Crohn's and would be at higher risk for GI bleeding on anticoagulation.  2 hypertension-blood pressure elevated.  Add losartan 50 mg daily.  Check potassium and renal function in 1 week.  Monitor blood pressure and adjust regimen as needed.  3 history of chest pain-no recurrent symptoms.  Previous  treadmill unremarkable.  Kirk Ruths, MD

## 2019-07-12 ENCOUNTER — Ambulatory Visit: Payer: BC Managed Care – PPO | Admitting: Cardiology

## 2019-07-12 ENCOUNTER — Other Ambulatory Visit: Payer: Self-pay

## 2019-07-12 ENCOUNTER — Other Ambulatory Visit: Payer: Self-pay | Admitting: Internal Medicine

## 2019-07-12 ENCOUNTER — Encounter: Payer: Self-pay | Admitting: Cardiology

## 2019-07-12 VITALS — BP 148/84 | HR 83 | Ht 65.5 in | Wt 168.4 lb

## 2019-07-12 DIAGNOSIS — I10 Essential (primary) hypertension: Secondary | ICD-10-CM

## 2019-07-12 DIAGNOSIS — R072 Precordial pain: Secondary | ICD-10-CM | POA: Diagnosis not present

## 2019-07-12 DIAGNOSIS — Z1231 Encounter for screening mammogram for malignant neoplasm of breast: Secondary | ICD-10-CM

## 2019-07-12 DIAGNOSIS — I480 Paroxysmal atrial fibrillation: Secondary | ICD-10-CM

## 2019-07-12 MED ORDER — LOSARTAN POTASSIUM 50 MG PO TABS: 50.0000 mg | ORAL_TABLET | Freq: Every day | ORAL | 3 refills | Status: DC

## 2019-07-12 NOTE — Patient Instructions (Signed)
Medication Instructions:   START LOSARTAN 50 MG ONCE DAILY  *If you need a refill on your cardiac medications before your next appointment, please call your pharmacy*   Lab Work:  Your physician recommends that you return for lab work in: [place]  If you have labs (blood work) drawn today and your tests are completely normal, you will receive your results only by: Marland Kitchen MyChart Message (if you have MyChart) OR . A paper copy in the mail If you have any lab test that is abnormal or we need to change your treatment, we will call you to review the results.   Follow-Up: At [facility], you and your health needs are our priority.  As part of our continuing mission to provide you with exceptional heart care, we have created designated Provider Care Teams.  These Care Teams include your primary Cardiologist (physician) and Advanced Practice Providers (APPs -  Physician Assistants and Nurse Practitioners) who all work together to provide you with the care you need, when you need it.  We recommend signing up for the patient portal called "MyChart".  Sign up information is provided on this After Visit Summary.  MyChart is used to connect with patients for Virtual Visits (Telemedicine).  Patients are able to view lab/test results, encounter notes, upcoming appointments, etc.  Non-urgent messages can be sent to your provider as well.   To learn more about what you can do with MyChart, go to [email or web address].    Your next appointment:   12 month(s)  The format for your next appointment:   Either In Person or Virtual  Provider:   You may see Kirk Ruths MD or one of the following Advanced Practice Providers on your designated Care Team:    Kerin Ransom, PA-C  [place], [place]  Coletta Memos, [place]

## 2019-07-15 DIAGNOSIS — I788 Other diseases of capillaries: Secondary | ICD-10-CM | POA: Diagnosis not present

## 2019-07-15 DIAGNOSIS — Z85828 Personal history of other malignant neoplasm of skin: Secondary | ICD-10-CM | POA: Diagnosis not present

## 2019-07-15 DIAGNOSIS — C44722 Squamous cell carcinoma of skin of right lower limb, including hip: Secondary | ICD-10-CM | POA: Diagnosis not present

## 2019-07-15 DIAGNOSIS — L570 Actinic keratosis: Secondary | ICD-10-CM | POA: Diagnosis not present

## 2019-07-15 DIAGNOSIS — L821 Other seborrheic keratosis: Secondary | ICD-10-CM | POA: Diagnosis not present

## 2019-07-15 DIAGNOSIS — L814 Other melanin hyperpigmentation: Secondary | ICD-10-CM | POA: Diagnosis not present

## 2019-07-16 ENCOUNTER — Encounter: Payer: Self-pay | Admitting: Cardiology

## 2019-07-16 DIAGNOSIS — K5090 Crohn's disease, unspecified, without complications: Secondary | ICD-10-CM | POA: Diagnosis not present

## 2019-07-16 DIAGNOSIS — Z79899 Other long term (current) drug therapy: Secondary | ICD-10-CM | POA: Diagnosis not present

## 2019-07-16 DIAGNOSIS — M810 Age-related osteoporosis without current pathological fracture: Secondary | ICD-10-CM | POA: Diagnosis not present

## 2019-07-22 DIAGNOSIS — I10 Essential (primary) hypertension: Secondary | ICD-10-CM | POA: Diagnosis not present

## 2019-07-22 DIAGNOSIS — K5090 Crohn's disease, unspecified, without complications: Secondary | ICD-10-CM | POA: Diagnosis not present

## 2019-07-22 DIAGNOSIS — M810 Age-related osteoporosis without current pathological fracture: Secondary | ICD-10-CM | POA: Diagnosis not present

## 2019-07-22 DIAGNOSIS — Z79899 Other long term (current) drug therapy: Secondary | ICD-10-CM | POA: Diagnosis not present

## 2019-07-22 LAB — BASIC METABOLIC PANEL
BUN/Creatinine Ratio: 19 (ref 12–28)
BUN: 14 mg/dL (ref 8–27)
CO2: 23 mmol/L (ref 20–29)
Calcium: 9 mg/dL (ref 8.7–10.3)
Chloride: 104 mmol/L (ref 96–106)
Creatinine, Ser: 0.72 mg/dL (ref 0.57–1.00)
GFR calc Af Amer: 102 mL/min/{1.73_m2} (ref >59)
GFR calc non Af Amer: 89 mL/min/{1.73_m2} (ref >59)
Glucose: 85 mg/dL (ref 65–99)
Potassium: 5 mmol/L (ref 3.5–5.2)
Sodium: 139 mmol/L (ref 134–144)

## 2019-07-23 ENCOUNTER — Encounter: Payer: Self-pay | Admitting: *Deleted

## 2019-07-28 ENCOUNTER — Telehealth: Payer: Self-pay | Admitting: *Deleted

## 2019-07-28 NOTE — Telephone Encounter (Signed)
Prolia insurance verification has been sent awaiting Summary of benefits

## 2019-07-30 NOTE — Telephone Encounter (Addendum)
Deductible N/A  OOP MAX $6000 ($358.38)  Annual exam 01/05/2019 TF  Calcium 9.0            Date 07/22/2019  Upcoming dental procedures   Prior Authorization needed YES approved 07/30/2019-07/29/2020  Pt estimated Cost $25  appt 08/06/2019    Coverage Details: $25 one dose, $0 admin fee

## 2019-08-06 ENCOUNTER — Other Ambulatory Visit: Payer: Self-pay

## 2019-08-06 ENCOUNTER — Ambulatory Visit (INDEPENDENT_AMBULATORY_CARE_PROVIDER_SITE_OTHER): Payer: BC Managed Care – PPO | Admitting: *Deleted

## 2019-08-06 DIAGNOSIS — M810 Age-related osteoporosis without current pathological fracture: Secondary | ICD-10-CM

## 2019-08-06 MED ORDER — DENOSUMAB 60 MG/ML ~~LOC~~ SOSY
60.0000 mg | PREFILLED_SYRINGE | Freq: Once | SUBCUTANEOUS | Status: AC
  Administered 2019-08-06: 60 mg via SUBCUTANEOUS

## 2019-08-16 ENCOUNTER — Other Ambulatory Visit: Payer: Self-pay

## 2019-08-16 ENCOUNTER — Ambulatory Visit
Admission: RE | Admit: 2019-08-16 | Discharge: 2019-08-16 | Disposition: A | Discharge status: Home / Self Care | Payer: BC Managed Care – PPO | Source: Ambulatory Visit | Attending: Internal Medicine | Admitting: Internal Medicine

## 2019-08-16 DIAGNOSIS — Z1231 Encounter for screening mammogram for malignant neoplasm of breast: Secondary | ICD-10-CM

## 2019-08-19 ENCOUNTER — Other Ambulatory Visit: Payer: Self-pay | Admitting: Oncology

## 2019-08-19 ENCOUNTER — Other Ambulatory Visit: Payer: Self-pay | Admitting: *Deleted

## 2019-08-19 ENCOUNTER — Telehealth: Payer: Self-pay | Admitting: Oncology

## 2019-08-19 ENCOUNTER — Inpatient Hospital Stay: Payer: BC Managed Care – PPO | Attending: Oncology | Admitting: Oncology

## 2019-08-19 ENCOUNTER — Other Ambulatory Visit: Payer: Self-pay

## 2019-08-19 ENCOUNTER — Inpatient Hospital Stay: Payer: BC Managed Care – PPO

## 2019-08-19 VITALS — BP 144/84 | HR 88 | Temp 98.2°F | Resp 16 | Ht 65.5 in | Wt 170.4 lb

## 2019-08-19 DIAGNOSIS — Z801 Family history of malignant neoplasm of trachea, bronchus and lung: Secondary | ICD-10-CM | POA: Diagnosis not present

## 2019-08-19 DIAGNOSIS — Z800 Family history of malignant neoplasm of digestive organs: Secondary | ICD-10-CM | POA: Diagnosis not present

## 2019-08-19 DIAGNOSIS — N6092 Unspecified benign mammary dysplasia of left breast: Secondary | ICD-10-CM

## 2019-08-19 DIAGNOSIS — Z79899 Other long term (current) drug therapy: Secondary | ICD-10-CM | POA: Insufficient documentation

## 2019-08-19 DIAGNOSIS — M810 Age-related osteoporosis without current pathological fracture: Secondary | ICD-10-CM | POA: Insufficient documentation

## 2019-08-19 DIAGNOSIS — Z86010 Personal history of colonic polyps: Secondary | ICD-10-CM | POA: Diagnosis not present

## 2019-08-19 DIAGNOSIS — K5090 Crohn's disease, unspecified, without complications: Secondary | ICD-10-CM | POA: Diagnosis not present

## 2019-08-19 DIAGNOSIS — Z803 Family history of malignant neoplasm of breast: Secondary | ICD-10-CM | POA: Diagnosis not present

## 2019-08-19 LAB — CMP (CANCER CENTER ONLY)
ALT: 21 U/L (ref 0–44)
AST: 22 U/L (ref 15–41)
Albumin: 3.8 g/dL (ref 3.5–5.0)
Alkaline Phosphatase: 65 U/L (ref 38–126)
Anion gap: 9 (ref 5–15)
BUN: 11 mg/dL (ref 8–23)
CO2: 26 mmol/L (ref 22–32)
Calcium: 9.2 mg/dL (ref 8.9–10.3)
Chloride: 106 mmol/L (ref 98–111)
Creatinine: 0.84 mg/dL (ref 0.44–1.00)
GFR, Est AFR Am: >60 mL/min (ref >60)
GFR, Estimated: >60 mL/min (ref >60)
Glucose, Bld: 100 mg/dL — ABNORMAL HIGH (ref 70–99)
Potassium: 4.1 mmol/L (ref 3.5–5.1)
Sodium: 141 mmol/L (ref 135–145)
Total Bilirubin: 0.3 mg/dL (ref 0.3–1.2)
Total Protein: 7.3 g/dL (ref 6.5–8.1)

## 2019-08-19 LAB — CBC WITH DIFFERENTIAL (CANCER CENTER ONLY)
Abs Immature Granulocytes: 0.06 10*3/uL (ref 0.00–0.07)
Basophils Absolute: 0.1 10*3/uL (ref 0.0–0.1)
Basophils Relative: 1 %
Eosinophils Absolute: 0.2 10*3/uL (ref 0.0–0.5)
Eosinophils Relative: 2 %
HCT: 39.5 % (ref 36.0–46.0)
Hemoglobin: 12.7 g/dL (ref 12.0–15.0)
Immature Granulocytes: 1 %
Lymphocytes Relative: 26 %
Lymphs Abs: 2.6 10*3/uL (ref 0.7–4.0)
MCH: 28.8 pg (ref 26.0–34.0)
MCHC: 32.2 g/dL (ref 30.0–36.0)
MCV: 89.6 fL (ref 80.0–100.0)
Monocytes Absolute: 0.8 10*3/uL (ref 0.1–1.0)
Monocytes Relative: 8 %
Neutro Abs: 6.2 10*3/uL (ref 1.7–7.7)
Neutrophils Relative %: 62 %
Platelet Count: 383 10*3/uL (ref 150–400)
RBC: 4.41 MIL/uL (ref 3.87–5.11)
RDW: 13.6 % (ref 11.5–15.5)
WBC Count: 9.9 10*3/uL (ref 4.0–10.5)
nRBC: 0 % (ref 0.0–0.2)

## 2019-08-19 NOTE — Telephone Encounter (Signed)
Scheduled appts per 6/24 los. Gave pt a print out of AVS.

## 2019-08-19 NOTE — Progress Notes (Signed)
[facility]  Telephone:([phone] [phone]) [phone]     ID: Robin [name] DOB: 07/22/1955  MR#: [id number]  XKG#:[id number]  Patient Care Team: Josetta Huddle, MD as PCP - General (Internal Medicine) Rolm Bookbinder, MD as Consulting Physician (General Surgery) Kasie Leccese, Virgie Dad, MD as Consulting Physician (Oncology) Fontaine, Belinda Block, MD (Inactive) as Consulting Physician (Gynecology) Harriett Sine, MD as Consulting Physician (Dermatology) Stanford Breed Denice Bors, MD as Consulting Physician (Cardiology) Garlan Fair, MD as Consulting Physician (Gastroenterology) Armbruster, Carlota Raspberry, MD as Consulting Physician (Gastroenterology) OTHER MD:   CHIEF COMPLAINT: atypical lobular hyperplasia  CURRENT TREATMENT:  observation   INTERVAL HISTORY: Robin [name] returns today for follow-up of her history of atypical lobular hyperplasia. She continues under observation.  Since her last visit, she underwent bilateral screening mammography with tomography at [facility] on 08/16/2019 showing: breast density category B; no evidence of malignancy in either breast.  She also underwent bone density screening on 03/11/2019 showing a T-score of -3.0, which is considered osteoporotic.  Her atrial fibrillation is controlled or verapamil.   REVIEW OF SYSTEMS: Robin [name] on vedolizumab for her Crohn's and is doing very well with it symptomatically.  She is back to work and depression and is concerned that she has to get pretty close to be able to fit in her shoes.  She is wearing a mask.  She has had both her Pfizer vaccine doses in February which she tolerated well.  [place] and she does see Dr. Elvera Lennox in dermatology every 3 months just to make sure nothing worrisome develops.  A detailed review of systems today was otherwise stable.   HISTORY OF CURRENT ILLNESS: from the original intake note:  Robin [name] herself noted a change in her left breast and  brought it to Dr. Christia Reading Fontaine's attention. Hhe set her up for diagnostic left breast mammogram with tomography and left breast ultrasonography 07/17/2015. This found the breast density to be category C.there was a subtle area of distortion in the left breast upper outer quadrant which was not palpable on examination. Ultrasonography found no definite correlate. Tomosynthesis guided biopsy was performed 07/27/2015, and showed ([id number]) atypical lobular hyperplasia.  The patient was evaluated by surgery and after appropriate discussion excisional biopsy was performed 09/25/2015. This showed ([id number]) a complex sclerosing lesion with the usual ductal hyperplasia but also again atypical lobular hyperplasia.  The patient's subsequent history is as detailed below   PAST MEDICAL HISTORY: Past Medical History:  Diagnosis Date  . Anemia   . Anxiety   . Arthritis   . Asthma    seasonal  . Atypical lobular hyperplasia (ALH) of left breast 07/2015  . Cancer ([place])    skin  . Cataract   . Crohn's disease ([place])    severe colonic stricturing  . GERD (gastroesophageal reflux disease)   . Hypertension   . Hypothyroidism   . IUD (intrauterine device) in place 1983   Safety coil per Dr. Valeta Harms note attempted to retrieve but unable to do so  . Osteoporosis 12/2016   T score -2.7 stable from prior DEXA on Prolia  . Thyroid disease    Hypothyroid    PAST SURGICAL HISTORY: Past Surgical History:  Procedure Laterality Date  . Basal and squamous cell Ca excised    . BREAST BIOPSY Left 2018  . BREAST EXCISIONAL BIOPSY Left 2018  . COLONOSCOPY N/A 07/01/2013   Procedure: COLONOSCOPY;  Surgeon: Garlan Fair, MD;  [facility];  Service: Endoscopy;  Laterality: N/A;  . COLONOSCOPY    . Fissure surg    . RADIOACTIVE SEED GUIDED EXCISIONAL BREAST BIOPSY Left 09/25/2015   Procedure: RADIOACTIVE SEED GUIDED EXCISIONAL BREAST BIOPSY;  Surgeon: Rolm Bookbinder, MD;  Location:  [facility];  Service: General;  Laterality: Left;  . TRIGGER FINGER RELEASE Right 02/01/2016   Procedure: RELEASE TRIGGER FINGER/A-1 PULLEY, right thumb;  Surgeon: Daryll Brod, MD;  Location: [facility];  Service: Orthopedics;  Laterality: Right;  FAB    FAMILY HISTORY Family History  Problem Relation Age of Onset  . Colon cancer Mother   . Heart disease Mother        Atrial fibrillation  . Crohn's disease Mother   . Heart disease Maternal Grandmother   . Hypertension Maternal Grandmother   . Breast cancer Paternal Grandmother        age unknown  . Esophageal cancer Neg Hx   . Liver cancer Neg Hx   . Pancreatic cancer Neg Hx   . Rectal cancer Neg Hx   . Stomach cancer Neg Hx   the patient has no information regarding her father. Her mother passed away in [id number] at 34 years old from small cell lung cancer; she had a history of colon polyps. There are no siblings. There is no history of breast or ovarian cancer in the family.   GYNECOLOGIC HISTORY:  No LMP recorded. Patient is postmenopausal. Robin [name] does not recall when she had her first period. She is GX P0. 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 Her husband Wille Glaser is in the roofing business. At home is just the 2 of them and 2 cats.    ADVANCED DIRECTIVES: in place   HEALTH MAINTENANCE: Social History   Tobacco Use  . Smoking status: Never Smoker  . Smokeless tobacco: Never Used  Vaping Use  . Vaping Use: Never used  Substance Use Topics  . Alcohol use: Yes    Comment: Occasional  . Drug use: No     Colonoscopy: UTD/ [facility]  PAP: 12/2017, Fontaine  Bone density: 12/2016, -2.7 (stable, on prolia)   Allergies  Allergen Reactions  . Codeine Nausea Only    Needs something for nausea to go with pain pill  . Tetracyclines & Related Diarrhea  . Biaxin [Clarithromycin] Palpitations    . Clarithromycin Palpitations    Current Outpatient Medications  Medication Sig Dispense Refill  . denosumab (PROLIA) 60 MG/ML SOLN injection Inject 60 mg into the skin every 6 (six) months. Administer in upper arm, thigh, or abdomen    . losartan (COZAAR) 50 MG tablet Take 1 tablet (50 mg total) by mouth daily. 90 tablet 3  . naproxen sodium (ALEVE) 220 MG tablet Take 220 mg by mouth daily as needed.    Marland Kitchen SYNTHROID 75 MCG tablet Take 1 tablet (75 mcg total) by mouth daily before breakfast. 90 tablet 3  . vedolizumab (ENTYVIO) 300 MG injection Inject into the vein.    . verapamil (VERELAN PM) 180 MG 24 hr capsule Take 2 capsules (360 mg total) by mouth daily. 180 capsule 3   Current Facility-Administered Medications  Medication Dose Route Frequency Provider Last Rate Last Admin  . 0.9 %  sodium chloride infusion  500 mL Intravenous Once Armbruster, Carlota Raspberry, MD        OBJECTIVE:  white woman in no  acute distress Vitals:   08/19/19 1406  BP: (!) 144/84  Pulse: 88  Resp: 16  Temp: 98.2 F (36.8 C)  SpO2: 97%     Body mass index is 27.92 kg/m.    ECOG FS:0 - Asymptomatic  Sclerae unicteric, EOMs intact Wearing a mask No cervical or supraclavicular adenopathy Lungs no rales or rhonchi Heart regular rate and rhythm Abd soft, nontender, positive bowel sounds MSK no focal spinal tenderness, no upper extremity lymphedema Neuro: nonfocal, well oriented, appropriate affect Breasts: Her just to stand as the shoulders and upper chest.  There are no masses or other changes of concern.  Both axillae are benign.   LAB RESULTS:   CMP     Component Value Date/Time   NA 141 08/19/2019 1332   NA 139 07/22/2019 0927   K 4.1 08/19/2019 1332   CL 106 08/19/2019 1332   CO2 26 08/19/2019 1332   GLUCOSE 100 (H) 08/19/2019 1332   BUN 11 08/19/2019 1332   BUN 14 07/22/2019 0927   CREATININE 0.84 08/19/2019 1332   CALCIUM 9.2 08/19/2019 1332   PROT 7.3 08/19/2019 1332   ALBUMIN 3.8  08/19/2019 1332   AST 22 08/19/2019 1332   ALT 21 08/19/2019 1332   ALKPHOS 65 08/19/2019 1332   BILITOT 0.3 08/19/2019 1332   GFRNONAA >60 08/19/2019 1332   GFRAA >60 08/19/2019 1332    INo results found for: SPEP, UPEP  Lab Results  Component Value Date   WBC 9.9 08/19/2019   NEUTROABS 6.2 08/19/2019   HGB 12.7 08/19/2019   HCT 39.5 08/19/2019   MCV 89.6 08/19/2019   PLT 383 08/19/2019      Chemistry      Component Value Date/Time   NA 141 08/19/2019 1332   NA 139 07/22/2019 0927   K 4.1 08/19/2019 1332   CL 106 08/19/2019 1332   CO2 26 08/19/2019 1332   BUN 11 08/19/2019 1332   BUN 14 07/22/2019 0927   CREATININE 0.84 08/19/2019 1332      Component Value Date/Time   CALCIUM 9.2 08/19/2019 1332   ALKPHOS 65 08/19/2019 1332   AST 22 08/19/2019 1332   ALT 21 08/19/2019 1332   BILITOT 0.3 08/19/2019 1332       No results found for: LABCA2  No components found for: LABCA125  No results for input(s): INR in the last 168 hours.  Urinalysis    Component Value Date/Time   COLORURINE YELLOW 08/16/2015 0006   APPEARANCEUR CLEAR 08/16/2015 0006   LABSPEC 1.022 08/16/2015 0006   PHURINE 5.5 08/16/2015 0006   GLUCOSEU NEGATIVE 08/16/2015 0006   HGBUR LARGE (A) 08/16/2015 0006   BILIRUBINUR NEGATIVE 08/16/2015 0006   KETONESUR NEGATIVE 08/16/2015 0006   PROTEINUR NEGATIVE 08/16/2015 0006   UROBILINOGEN 0.2 12/16/2013 0854   NITRITE NEGATIVE 08/16/2015 0006   LEUKOCYTESUR MODERATE (A) 08/16/2015 0006     STUDIES: MM 3D SCREEN BREAST BILATERAL  Result Date: 08/18/2019 CLINICAL DATA:  Screening. EXAM: DIGITAL SCREENING BILATERAL MAMMOGRAM WITH TOMO AND CAD COMPARISON:  Previous exam(s). ACR Breast Density Category b: There are scattered areas of fibroglandular density. FINDINGS: There are no findings suspicious for malignancy. Images were processed with CAD. IMPRESSION: No mammographic evidence of malignancy. A result letter of this screening mammogram will be  mailed directly to the patient. RECOMMENDATION: Screening mammogram in one year. (Code:SM-B-01Y) BI-RADS CATEGORY  1: Negative. Electronically Signed   By: Kristopher Oppenheim M.D.   On: 08/18/2019 12:37  ELIGIBLE FOR AVAILABLE RESEARCH PROTOCOL: no   ASSESSMENT: 64 y.o. [place], [place] woman Status post left breast upper outer quadrant biopsy 07/27/2015 showing atypical lobular hyperplasia  (1) left lumpectomy 09/25/2015 showed a complex sclerosing lesion and atypical lobular hyperplasia.  (2) Crohn's disease: on vedolizumab   PLAN: Kathaleya is now 39 years into observation for her history of atypical ductal hyperplasia.  There is no evidence of disease.  We discussed her mammogram which shows very low density which is very favorable.  She was concerned about going back to work even though she has been vaccinated because she is on an immunosuppressive drug for her Crohn's.  We looked that up and discuss the results and that primarily immunosuppressive state got but does not significantly immunosuppressed systemically so I think she will be fine.  Nevertheless I would still wear a mask and keep distance during work if she is able to.  She does be Sundays a lot.  This ages the skin and also of course relates to squamous and melanoma cancers of the skin.  She does have intensive dermatologic follow-up for that so that is the way she is dealing with that particular choice.  Otherwise she is doing great and she will see me again in 1 year.  She knows to call for any other issue that may develop before then  Total encounter time 20 minutes.*   Alexarae Oliva, Virgie Dad, MD  08/19/19 2:34 PM Medical Oncology and Hematology [facility] [place], [place] [address] Tel. [phone]    Fax. [phone]    I, Wilburn Mylar, am acting as scribe for Dr. Virgie Dad. Trellis Guirguis.  I, Lurline Del MD, have reviewed the above documentation for accuracy and completeness, and I  agree with the above.   *Total Encounter Time as defined by the Centers for Medicare and Medicaid Services includes, in addition to the face-to-face time of a patient visit (documented in the note above) non-face-to-face time: obtaining and reviewing outside history, ordering and reviewing medications, tests or procedures, care coordination (communications with other health care professionals or caregivers) and documentation in the medical record.

## 2019-08-20 DIAGNOSIS — K5090 Crohn's disease, unspecified, without complications: Secondary | ICD-10-CM | POA: Diagnosis not present

## 2019-09-15 ENCOUNTER — Encounter: Payer: Self-pay | Admitting: Gynecology

## 2019-09-17 DIAGNOSIS — K5090 Crohn's disease, unspecified, without complications: Secondary | ICD-10-CM | POA: Diagnosis not present

## 2019-09-29 ENCOUNTER — Other Ambulatory Visit: Payer: Self-pay | Admitting: Cardiology

## 2019-09-29 DIAGNOSIS — I4891 Unspecified atrial fibrillation: Secondary | ICD-10-CM

## 2019-09-29 DIAGNOSIS — I10 Essential (primary) hypertension: Secondary | ICD-10-CM

## 2019-10-08 DIAGNOSIS — L821 Other seborrheic keratosis: Secondary | ICD-10-CM | POA: Diagnosis not present

## 2019-10-08 DIAGNOSIS — L814 Other melanin hyperpigmentation: Secondary | ICD-10-CM | POA: Diagnosis not present

## 2019-10-08 DIAGNOSIS — L905 Scar conditions and fibrosis of skin: Secondary | ICD-10-CM | POA: Diagnosis not present

## 2019-10-08 DIAGNOSIS — Z85828 Personal history of other malignant neoplasm of skin: Secondary | ICD-10-CM | POA: Diagnosis not present

## 2019-10-08 DIAGNOSIS — L570 Actinic keratosis: Secondary | ICD-10-CM | POA: Diagnosis not present

## 2019-10-12 DIAGNOSIS — Z0000 Encounter for general adult medical examination without abnormal findings: Secondary | ICD-10-CM | POA: Diagnosis not present

## 2019-10-12 DIAGNOSIS — R000 Tachycardia, unspecified: Secondary | ICD-10-CM | POA: Diagnosis not present

## 2019-10-12 DIAGNOSIS — E559 Vitamin D deficiency, unspecified: Secondary | ICD-10-CM | POA: Diagnosis not present

## 2019-10-12 DIAGNOSIS — Z79899 Other long term (current) drug therapy: Secondary | ICD-10-CM | POA: Diagnosis not present

## 2019-10-12 DIAGNOSIS — K50118 Crohn's disease of large intestine with other complication: Secondary | ICD-10-CM | POA: Diagnosis not present

## 2019-10-12 DIAGNOSIS — D649 Anemia, unspecified: Secondary | ICD-10-CM | POA: Diagnosis not present

## 2019-10-12 DIAGNOSIS — E7800 Pure hypercholesterolemia, unspecified: Secondary | ICD-10-CM | POA: Diagnosis not present

## 2019-10-12 DIAGNOSIS — M810 Age-related osteoporosis without current pathological fracture: Secondary | ICD-10-CM | POA: Diagnosis not present

## 2019-10-12 DIAGNOSIS — I10 Essential (primary) hypertension: Secondary | ICD-10-CM | POA: Diagnosis not present

## 2019-10-15 DIAGNOSIS — K5090 Crohn's disease, unspecified, without complications: Secondary | ICD-10-CM | POA: Diagnosis not present

## 2019-11-03 DIAGNOSIS — L700 Acne vulgaris: Secondary | ICD-10-CM | POA: Diagnosis not present

## 2019-11-03 DIAGNOSIS — L820 Inflamed seborrheic keratosis: Secondary | ICD-10-CM | POA: Diagnosis not present

## 2019-11-03 DIAGNOSIS — D485 Neoplasm of uncertain behavior of skin: Secondary | ICD-10-CM | POA: Diagnosis not present

## 2019-11-03 DIAGNOSIS — L570 Actinic keratosis: Secondary | ICD-10-CM | POA: Diagnosis not present

## 2019-11-03 DIAGNOSIS — Z85828 Personal history of other malignant neoplasm of skin: Secondary | ICD-10-CM | POA: Diagnosis not present

## 2019-11-03 DIAGNOSIS — L738 Other specified follicular disorders: Secondary | ICD-10-CM | POA: Diagnosis not present

## 2019-11-03 DIAGNOSIS — L720 Epidermal cyst: Secondary | ICD-10-CM | POA: Diagnosis not present

## 2019-11-19 DIAGNOSIS — K5090 Crohn's disease, unspecified, without complications: Secondary | ICD-10-CM | POA: Diagnosis not present

## 2019-11-19 DIAGNOSIS — Z79899 Other long term (current) drug therapy: Secondary | ICD-10-CM | POA: Diagnosis not present

## 2019-11-22 DIAGNOSIS — R635 Abnormal weight gain: Secondary | ICD-10-CM | POA: Diagnosis not present

## 2019-11-22 DIAGNOSIS — I10 Essential (primary) hypertension: Secondary | ICD-10-CM | POA: Diagnosis not present

## 2019-12-13 NOTE — Telephone Encounter (Signed)
PROLIA GIVEN 08/06/2019 NEXT INJECTION 02/06/2020

## 2019-12-24 DIAGNOSIS — K5090 Crohn's disease, unspecified, without complications: Secondary | ICD-10-CM | POA: Diagnosis not present

## 2019-12-27 DIAGNOSIS — H2513 Age-related nuclear cataract, bilateral: Secondary | ICD-10-CM | POA: Diagnosis not present

## 2019-12-27 DIAGNOSIS — H43811 Vitreous degeneration, right eye: Secondary | ICD-10-CM | POA: Diagnosis not present

## 2019-12-27 DIAGNOSIS — H04123 Dry eye syndrome of bilateral lacrimal glands: Secondary | ICD-10-CM | POA: Diagnosis not present

## 2019-12-27 DIAGNOSIS — H35371 Puckering of macula, right eye: Secondary | ICD-10-CM | POA: Diagnosis not present

## 2020-01-06 ENCOUNTER — Other Ambulatory Visit: Payer: Self-pay

## 2020-01-06 ENCOUNTER — Encounter: Payer: Self-pay | Admitting: Obstetrics and Gynecology

## 2020-01-06 ENCOUNTER — Ambulatory Visit: Payer: BC Managed Care – PPO | Admitting: Obstetrics and Gynecology

## 2020-01-06 ENCOUNTER — Encounter: Payer: BC Managed Care – PPO | Admitting: Gynecology

## 2020-01-06 VITALS — BP 130/82 | Ht 66.0 in | Wt 172.0 lb

## 2020-01-06 DIAGNOSIS — M810 Age-related osteoporosis without current pathological fracture: Secondary | ICD-10-CM | POA: Diagnosis not present

## 2020-01-06 DIAGNOSIS — Z01419 Encounter for gynecological examination (general) (routine) without abnormal findings: Secondary | ICD-10-CM | POA: Diagnosis not present

## 2020-01-06 NOTE — Progress Notes (Signed)
Robin [name] [birth year] [id number]  SUBJECTIVE:  64 y.o. G0P0 female here for annual routine gynecologic exam and Pap smear. She has no gynecologic concerns.   Current Outpatient Medications  Medication Sig Dispense Refill  . denosumab (PROLIA) 60 MG/ML SOLN injection Inject 60 mg into the skin every 6 (six) months. Administer in upper arm, thigh, or abdomen    . naproxen sodium (ALEVE) 220 MG tablet Take 220 mg by mouth daily as needed.    Marland Kitchen SYNTHROID 75 MCG tablet Take 1 tablet (75 mcg total) by mouth daily before breakfast. 90 tablet 3  . vedolizumab (ENTYVIO) 300 MG injection Inject into the vein.    . verapamil (VERELAN PM) 180 MG 24 hr capsule TAKE 2 CAPSULES (360 MG TOTAL) BY MOUTH DAILY. 180 capsule 3  . losartan (COZAAR) 50 MG tablet Take 1 tablet (50 mg total) by mouth daily. 90 tablet 3   Current Facility-Administered Medications  Medication Dose Route Frequency Provider Last Rate Last Admin  . 0.9 %  sodium chloride infusion  500 mL Intravenous Once Armbruster, Carlota Raspberry, MD       Allergies: Codeine, Tetracyclines & related, Biaxin [clarithromycin], and Clarithromycin  No LMP recorded. Patient is postmenopausal.  Past medical history,surgical history, problem list, medications, allergies, family history and social history were all reviewed and documented as reviewed in the EPIC chart.  ROS: Pertinent positives and negatives as reviewed in HPI   OBJECTIVE:  BP 130/82   Ht 5' 6"  (1.676 m)   Wt 172 lb (78 kg)   BMI 27.76 kg/m  The patient appears well, alert, oriented, in no distress. ENT normal.  Neck supple. No cervical or supraclavicular adenopathy or thyromegaly.  Lungs are clear, good air entry, no wheezes, rhonchi or rales. S1 and S2 normal, no murmurs, regular rate and rhythm.  Abdomen soft without tenderness, guarding, mass or organomegaly.  Neurological is normal, no focal findings.  BREAST EXAM: breasts appear normal, no suspicious masses, no skin or  nipple changes or axillary nodes  PELVIC EXAM: VULVA: normal appearing vulva with atrophic change, no masses, tenderness or lesions, VAGINA: normal appearing vagina with atrophic change, normal color and discharge, no lesions, CERVIX: normal appearing cervix without discharge or lesions, UTERUS: uterus is normal size, shape, consistency and nontender, ADNEXA: normal adnexa in size, nontender and no masses  Chaperone: [name] present during the examination  ASSESSMENT:  64 y.o. G0P0 here for annual gynecologic exam  PLAN:   1. Postmenopausal.  No significant hot flashes or night sweats.  No vaginal bleeding. 2. Pap smear 2019.  No significant history of abnormal Pap smears.  Next Pap smear due 2022 following the current guidelines recommending the 3 year interval. 3.  History of atypical ductal hyperplasia.  Mammogram 07/2019.  Actively follows with Dr. Jana Hakim.  Normal breast exam today.  She is reminded to schedule an annual mammogram when due. 4.  History of safety coil placement in 1983, previous on successful removal attempt.  Recently had a CT in the past several years which demonstrated intrauterine placement.  She has been fine with leaving it in place unless anything changes in regards to any symptoms and she will let us know. 5. Colonoscopy 2019.  She will follow up at the recommended interval.  6. Osteoporosis.  DEXA 02/2019.  T score -3.0.  Maintained on Prolia which she will continue.  Next DEXA recommended in 2023 and would review this at her next appointment. 7. Health maintenance.  No labs  today as she normally has these completed elsewhere.  Return annually or sooner, prn.  Joseph Pierini MD 01/06/20

## 2020-02-01 ENCOUNTER — Telehealth: Payer: Self-pay | Admitting: *Deleted

## 2020-02-01 NOTE — Telephone Encounter (Addendum)
Deductible n/a  OOP MAX n/a  Annual exam 01/06/2020  Calcium   9.0          Date 07/22/2019  Upcoming dental procedures   Prior Authorization needed yes on file 07/29/2020  Pt estimated Cost $0   APPT 02/08/2020    Coverage Details: $0 one dose, $0 admin fee

## 2020-02-04 DIAGNOSIS — K5090 Crohn's disease, unspecified, without complications: Secondary | ICD-10-CM | POA: Diagnosis not present

## 2020-02-08 ENCOUNTER — Ambulatory Visit (INDEPENDENT_AMBULATORY_CARE_PROVIDER_SITE_OTHER): Payer: BC Managed Care – PPO | Admitting: *Deleted

## 2020-02-08 ENCOUNTER — Other Ambulatory Visit: Payer: Self-pay

## 2020-02-08 DIAGNOSIS — M810 Age-related osteoporosis without current pathological fracture: Secondary | ICD-10-CM | POA: Diagnosis not present

## 2020-02-08 MED ORDER — DENOSUMAB 60 MG/ML ~~LOC~~ SOSY
60.0000 mg | PREFILLED_SYRINGE | Freq: Once | SUBCUTANEOUS | Status: AC
  Administered 2020-02-08: 60 mg via SUBCUTANEOUS

## 2020-02-21 ENCOUNTER — Ambulatory Visit
Admission: RE | Admit: 2020-02-21 | Discharge: 2020-02-21 | Disposition: A | Discharge status: Home / Self Care | Payer: BC Managed Care – PPO | Source: Ambulatory Visit | Attending: Internal Medicine | Admitting: Internal Medicine

## 2020-02-21 ENCOUNTER — Other Ambulatory Visit: Payer: Self-pay | Admitting: Internal Medicine

## 2020-02-21 DIAGNOSIS — M25571 Pain in right ankle and joints of right foot: Secondary | ICD-10-CM

## 2020-02-21 DIAGNOSIS — R635 Abnormal weight gain: Secondary | ICD-10-CM | POA: Diagnosis not present

## 2020-02-21 DIAGNOSIS — E559 Vitamin D deficiency, unspecified: Secondary | ICD-10-CM | POA: Diagnosis not present

## 2020-02-21 DIAGNOSIS — I10 Essential (primary) hypertension: Secondary | ICD-10-CM | POA: Diagnosis not present

## 2020-02-21 DIAGNOSIS — K50118 Crohn's disease of large intestine with other complication: Secondary | ICD-10-CM | POA: Diagnosis not present

## 2020-02-21 DIAGNOSIS — J4520 Mild intermittent asthma, uncomplicated: Secondary | ICD-10-CM | POA: Diagnosis not present

## 2020-02-21 DIAGNOSIS — M7731 Calcaneal spur, right foot: Secondary | ICD-10-CM | POA: Diagnosis not present

## 2020-03-09 DIAGNOSIS — K5090 Crohn's disease, unspecified, without complications: Secondary | ICD-10-CM | POA: Diagnosis not present

## 2020-03-30 DIAGNOSIS — L821 Other seborrheic keratosis: Secondary | ICD-10-CM | POA: Diagnosis not present

## 2020-03-30 DIAGNOSIS — L570 Actinic keratosis: Secondary | ICD-10-CM | POA: Diagnosis not present

## 2020-03-30 DIAGNOSIS — L814 Other melanin hyperpigmentation: Secondary | ICD-10-CM | POA: Diagnosis not present

## 2020-03-30 DIAGNOSIS — Z85828 Personal history of other malignant neoplasm of skin: Secondary | ICD-10-CM | POA: Diagnosis not present

## 2020-03-30 DIAGNOSIS — L738 Other specified follicular disorders: Secondary | ICD-10-CM | POA: Diagnosis not present

## 2020-04-07 DIAGNOSIS — K5090 Crohn's disease, unspecified, without complications: Secondary | ICD-10-CM | POA: Diagnosis not present

## 2020-05-05 DIAGNOSIS — K5090 Crohn's disease, unspecified, without complications: Secondary | ICD-10-CM | POA: Diagnosis not present

## 2020-06-02 DIAGNOSIS — K5090 Crohn's disease, unspecified, without complications: Secondary | ICD-10-CM | POA: Diagnosis not present

## 2020-06-27 DIAGNOSIS — E041 Nontoxic single thyroid nodule: Secondary | ICD-10-CM | POA: Diagnosis not present

## 2020-06-27 DIAGNOSIS — K50118 Crohn's disease of large intestine with other complication: Secondary | ICD-10-CM | POA: Diagnosis not present

## 2020-06-27 DIAGNOSIS — E7800 Pure hypercholesterolemia, unspecified: Secondary | ICD-10-CM | POA: Diagnosis not present

## 2020-06-27 DIAGNOSIS — I10 Essential (primary) hypertension: Secondary | ICD-10-CM | POA: Diagnosis not present

## 2020-06-30 DIAGNOSIS — K5090 Crohn's disease, unspecified, without complications: Secondary | ICD-10-CM | POA: Diagnosis not present

## 2020-07-04 DIAGNOSIS — L570 Actinic keratosis: Secondary | ICD-10-CM | POA: Diagnosis not present

## 2020-07-04 DIAGNOSIS — D0471 Carcinoma in situ of skin of right lower limb, including hip: Secondary | ICD-10-CM | POA: Diagnosis not present

## 2020-07-04 DIAGNOSIS — L821 Other seborrheic keratosis: Secondary | ICD-10-CM | POA: Diagnosis not present

## 2020-07-04 DIAGNOSIS — L720 Epidermal cyst: Secondary | ICD-10-CM | POA: Diagnosis not present

## 2020-07-04 DIAGNOSIS — L814 Other melanin hyperpigmentation: Secondary | ICD-10-CM | POA: Diagnosis not present

## 2020-07-04 DIAGNOSIS — Z85828 Personal history of other malignant neoplasm of skin: Secondary | ICD-10-CM | POA: Diagnosis not present

## 2020-07-06 ENCOUNTER — Other Ambulatory Visit: Payer: Self-pay | Admitting: Internal Medicine

## 2020-07-06 DIAGNOSIS — M810 Age-related osteoporosis without current pathological fracture: Secondary | ICD-10-CM | POA: Diagnosis not present

## 2020-07-06 DIAGNOSIS — Z79899 Other long term (current) drug therapy: Secondary | ICD-10-CM | POA: Diagnosis not present

## 2020-07-06 DIAGNOSIS — K5090 Crohn's disease, unspecified, without complications: Secondary | ICD-10-CM | POA: Diagnosis not present

## 2020-07-06 DIAGNOSIS — Z1231 Encounter for screening mammogram for malignant neoplasm of breast: Secondary | ICD-10-CM

## 2020-07-07 ENCOUNTER — Telehealth: Payer: Self-pay | Admitting: *Deleted

## 2020-07-07 NOTE — Telephone Encounter (Signed)
Prolia insurance verification has been sent awaiting Summary of benefits

## 2020-07-07 NOTE — Telephone Encounter (Signed)
PROLIA GIVEN 02/08/2020 NEXT INJECTION 08/09/2020

## 2020-07-08 NOTE — Progress Notes (Signed)
HPI: Follow-up atrial fibrillation. Patient seen in the emergency room January 2016 with palpitations. She had an electrocardiogram that demonstrated fibrillation and converted spontaneously to sinus rhythm. She had an event monitor1/16that did not show evidence of atrial fibrillation. Her echocardiogram in February 2016 showed normal LV function, mild left ventricular hypertrophy, atrial septum aneurysmal.Exercise treadmill April 2017 with no ST changes.Apixaban recommended previously but pt declined. Since last seen,patient denies dyspnea, chest pain or syncope.  Occasional palpitations.  Current Outpatient Medications  Medication Sig Dispense Refill  . denosumab (PROLIA) 60 MG/ML SOLN injection Inject 60 mg into the skin every 6 (six) months. Administer in upper arm, thigh, or abdomen    . naproxen sodium (ALEVE) 220 MG tablet Take 220 mg by mouth daily as needed.    Marland Kitchen SYNTHROID 75 MCG tablet Take 1 tablet (75 mcg total) by mouth daily before breakfast. 90 tablet 3  . vedolizumab (ENTYVIO) 300 MG injection Inject into the vein.    . verapamil (VERELAN PM) 180 MG 24 hr capsule TAKE 2 CAPSULES (360 MG TOTAL) BY MOUTH DAILY. 180 capsule 3  . losartan (COZAAR) 50 MG tablet Take 1 tablet (50 mg total) by mouth daily. 90 tablet 3   Current Facility-Administered Medications  Medication Dose Route Frequency Provider Last Rate Last Admin  . 0.9 %  sodium chloride infusion  500 mL Intravenous Once Armbruster, Carlota Raspberry, MD         Past Medical History:  Diagnosis Date  . Anemia   . Anxiety   . Arthritis   . Asthma    seasonal  . Atypical lobular hyperplasia (ALH) of left breast 07/2015  . Cancer ([place])    skin  . Cataract   . Crohn's disease ([place])    severe colonic stricturing  . GERD (gastroesophageal reflux disease)   . Hypertension   . Hypothyroidism   . IUD (intrauterine device) in place 1983   Safety coil per Dr. Valeta Harms note attempted to retrieve but unable to do so   . Osteoporosis 12/2016   T score -2.7 stable from prior DEXA on Prolia  . Thyroid disease    Hypothyroid    Past Surgical History:  Procedure Laterality Date  . Basal and squamous cell Ca excised    . BREAST BIOPSY Left 2018  . BREAST EXCISIONAL BIOPSY Left 2018  . COLONOSCOPY N/A 07/01/2013   Procedure: COLONOSCOPY;  Surgeon: Garlan Fair, MD;  Location: [facility];  Service: Endoscopy;  Laterality: N/A;  . COLONOSCOPY    . Fissure surg    . RADIOACTIVE SEED GUIDED EXCISIONAL BREAST BIOPSY Left 09/25/2015   Procedure: RADIOACTIVE SEED GUIDED EXCISIONAL BREAST BIOPSY;  Surgeon: Rolm Bookbinder, MD;  Location: [facility];  Service: General;  Laterality: Left;  . TRIGGER FINGER RELEASE Right 02/01/2016   Procedure: RELEASE TRIGGER FINGER/A-1 PULLEY, right thumb;  Surgeon: Daryll Brod, MD;  Location: [facility];  Service: Orthopedics;  Laterality: Right;  FAB    Social History   Socioeconomic History  . Marital status: Married    Spouse name: Not on file  . Number of children: Not on file  . Years of education: Not on file  . Highest education level: Not on file  Occupational History  . Not on file  Tobacco Use  . Smoking status: Never Smoker  . Smokeless tobacco: Never Used  Vaping Use  . Vaping Use: Never used  Substance and Sexual Activity  . Alcohol use: Yes  Comment: Occasional  . Drug use: No  . Sexual activity: Not Currently    Birth control/protection: Post-menopausal    Comment: Pt. declined sexual hx questions  Other Topics Concern  . Not on file  Social History Narrative  . Not on file   Social Determinants of Health   Financial Resource Strain: Not on file  Food Insecurity: Not on file  Transportation Needs: Not on file  Physical Activity: Not on file  Stress: Not on file  Social Connections: Not on file  Intimate Partner Violence: Not on file    Family History  Problem Relation Age of Onset  . Colon cancer  Mother   . Heart disease Mother        Atrial fibrillation  . Crohn's disease Mother   . Heart disease Maternal Grandmother   . Hypertension Maternal Grandmother   . Breast cancer Paternal Grandmother        age unknown  . Esophageal cancer Neg Hx   . Liver cancer Neg Hx   . Pancreatic cancer Neg Hx   . Rectal cancer Neg Hx   . Stomach cancer Neg Hx     ROS: no fevers or chills, productive cough, hemoptysis, dysphasia, odynophagia, melena, hematochezia, dysuria, hematuria, rash, seizure activity, orthopnea, PND, pedal edema, claudication. Remaining systems are negative.  Physical Exam: Well-developed well-nourished in no acute distress.  Skin is warm and dry.  HEENT is normal.  Neck is supple.  Chest is clear to auscultation with normal expansion.  Cardiovascular exam is regular rate and rhythm.  Abdominal exam nontender or distended. No masses palpated. Extremities show no edema. neuro grossly intact  ECG-normal sinus rhythm at a rate of 85, no ST changes.  Personally reviewed  A/P  1 paroxysmal atrial fibrillation-by history she has had no recurrences though occasional palpitations.  Continue verapamil.  Her CHA2DS2-VASc is 3.  I have recommended anticoagulation to prevent the risk of an embolic event but she continues to be hesitant due to her history of Crohn's and risk of GI bleed.  She will contact us if she would be agreeable in the future.   2 hypertension-blood pressure controlled.  Continue present medical regimen.  Kirk Ruths, MD

## 2020-07-18 ENCOUNTER — Other Ambulatory Visit: Payer: Self-pay

## 2020-07-18 ENCOUNTER — Ambulatory Visit: Payer: BC Managed Care – PPO | Admitting: Cardiology

## 2020-07-18 ENCOUNTER — Encounter: Payer: Self-pay | Admitting: Cardiology

## 2020-07-18 VITALS — BP 130/84 | HR 85 | Ht 65.0 in | Wt 169.8 lb

## 2020-07-18 DIAGNOSIS — I10 Essential (primary) hypertension: Secondary | ICD-10-CM

## 2020-07-18 DIAGNOSIS — I480 Paroxysmal atrial fibrillation: Secondary | ICD-10-CM | POA: Diagnosis not present

## 2020-07-18 NOTE — Patient Instructions (Signed)

## 2020-07-19 ENCOUNTER — Encounter: Payer: Self-pay | Admitting: Physician Assistant

## 2020-07-19 ENCOUNTER — Ambulatory Visit: Payer: BC Managed Care – PPO | Admitting: Physician Assistant

## 2020-07-19 VITALS — BP 122/78 | HR 92 | Ht 64.5 in | Wt 169.5 lb

## 2020-07-19 DIAGNOSIS — K50119 Crohn's disease of large intestine with unspecified complications: Secondary | ICD-10-CM | POA: Diagnosis not present

## 2020-07-19 NOTE — Patient Instructions (Signed)
If you are age 65 or older, your body mass index should be between 23-30. Your Body mass index is 28.65 kg/m. If this is out of the aforementioned range listed, please consider follow up with your Primary Care Provider.  If you are age 51 or younger, your body mass index should be between 19-25. Your Body mass index is 28.65 kg/m. If this is out of the aformentioned range listed, please consider follow up with your Primary Care Provider.   Continue your Entyvio schedule   Our office will reach out to you about your Colonoscopy scheduling with Dr. Havery Moros.  Thank you for entrusting me with your care and choosing [facility].  Amy Esterwood, PA-C

## 2020-07-20 ENCOUNTER — Encounter: Payer: Self-pay | Admitting: Physician Assistant

## 2020-07-20 NOTE — Progress Notes (Signed)
Subjective:    Patient ID: Robin [name], female    DOB: 06-Feb-1956, 65 y.o.   MRN: [id number]  HPI [name] is a pleasant 65 year old female, established with Dr. Havery Moros.  She comes in today for follow-up, to discuss possible follow-up colonoscopy and also is concerned about a small nodule that she has just felt near her rectum. Patient was last seen here in September 2019.  She had colonoscopy in March 2019 at which time she was found to have a severe stricture of the sigmoid colon that could only be traversed with the upper endoscope, she also had multiple areas of stricturing and luminal narrowing and ulcerations in the transverse colon, right colon was not evaluated at that time.  Biopsy showed no evidence of malignancy.  EGD was negative.  CT enterography showed a normal small intestine but stenosis in the transverse colon with long segment of left-sided inflammation. She was started on Entyvio, symptoms clinically improved and then she underwent follow-up colonoscopy in December 2019 which still required use of the upper endoscope to traverse the strictures, she had benign stenoses in the sigmoid colon transverse colon and ascending colon with some ulceration though improved over the prior colonoscopy.  Biopsy showed chronic active inflammation consistent with ulcer, she was also noted to have benign anal papillae hypertrophy and 1 hyperplastic polyp.  She has been receiving Entyvio through the infusion center at [facility], monthly.  Labs have been followed by Dr. Dossie Der. Patient says that she feels like she is doing well she continues to have diarrhea and says that her stools are never formed but she goes much less frequently than she used to.  Generally having 1-3 bowel movements per day.  She says the differences at this point she is not having any abdominal pain has no urgency for bowel movements and sleeps through the night without need to get up for a bowel movement.  She occasionally  has a mild discomfort in her left upper quadrant.  She has not had noted any melena or hematochezia. She says she is supposed to go for labs at the infusion center later this week.  She mentioned that she was told one of her hepatitis B labs returned positive and she is to have further labs done for that. She says a couple of days ago she noticed a little bump just inside the anus which she had not noticed previously.  She has no complaints of rectal pain or discomfort, has not had issues with hemorrhoids, no bleeding.  Prior hepatitis labs here 2019 hepatitis B surface antigen negative, surface antibody negative and core IgM negative  Review of labs from [facility] shows recent hepatitis B core antibody reactive, hepatitis B surface antigen negative, core IgM not done  Review of Systems Pertinent positive and negative review of systems were noted in the above HPI section.  All other review of systems was otherwise negative.  Outpatient Encounter Medications as of 07/19/2020  Medication Sig  . denosumab (PROLIA) 60 MG/ML SOLN injection Inject 60 mg into the skin every 6 (six) months. Administer in upper arm, thigh, or abdomen  . losartan-hydrochlorothiazide (HYZAAR) 100-12.5 MG tablet Take 1 tablet by mouth daily.  . naproxen sodium (ALEVE) 220 MG tablet Take 220 mg by mouth daily as needed.  Marland Kitchen SYNTHROID 75 MCG tablet Take 1 tablet (75 mcg total) by mouth daily before breakfast.  . vedolizumab (ENTYVIO) 300 MG injection Inject into the vein.  . verapamil (VERELAN PM) 180 MG 24  hr capsule TAKE 2 CAPSULES (360 MG TOTAL) BY MOUTH DAILY.  . [DISCONTINUED] losartan (COZAAR) 50 MG tablet Take 1 tablet (50 mg total) by mouth daily.   Facility-Administered Encounter Medications as of 07/19/2020  Medication  . 0.9 %  sodium chloride infusion   Allergies  Allergen Reactions  . Codeine Nausea Only    Needs something for nausea to go with pain pill  . Tetracyclines & Related Diarrhea  . Biaxin  [Clarithromycin] Palpitations  . Clarithromycin Palpitations   Patient Active Problem List   Diagnosis Date Noted  . Atypical lobular hyperplasia of left breast 08/07/2015  . Essential hypertension 05/08/2015  . Atrial fibrillation ([place]) 03/15/2014  . Chest pain 03/15/2014  . 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 Hypothyroidism 12/02/2011  . Osteoporosis    Social History   Socioeconomic History  . Marital status: Married    Spouse name: Not on file  . Number of children: Not on file  . Years of education: Not on file  . Highest education level: Not on file  Occupational History  . Not on file  Tobacco Use  . Smoking status: Never Smoker  . Smokeless tobacco: Never Used  Vaping Use  . Vaping Use: Never used  Substance and Sexual Activity  . Alcohol use: Yes    Comment: Occasional  . Drug use: No  . Sexual activity: Not Currently    Birth control/protection: Post-menopausal    Comment: Pt. declined sexual hx questions  Other Topics Concern  . Not on file  Social History Narrative  . Not on file   Social Determinants of Health   Financial Resource Strain: Not on file  Food Insecurity: Not on file  Transportation Needs: Not on file  Physical Activity: Not on file  Stress: Not on file  Social Connections: Not on file  Intimate Partner Violence: Not on file    Ms. Swoveland's family history includes Breast cancer in her paternal grandmother; Colon cancer in her mother; Crohn's disease in her mother; Heart disease in her maternal grandmother and mother; Hypertension in her maternal grandmother.      Objective:    Vitals:   07/19/20 1521  BP: 122/78  Pulse: 92    Physical Exam Well-developed well-nourished older female e in no acute distress.  Height, Weight, 169 BMI 28.6  HEENT; nontraumatic normocephalic, EOMI, PE R LA, sclera anicteric. Oropharynx; not examined today Neck; supple, no JVD Cardiovascular; regular rate and  rhythm with S1-S2, no murmur rub or gallop Pulmonary; Clear bilaterally Abdomen; soft, nontender, nondistended, no palpable mass or hepatosplenomegaly, bowel sounds are active Rectal; no external lesions noted, on digital exam she has a small mobile hypertrophied anal papillae palpable, nontender Skin; benign exam, no jaundice rash or appreciable lesions Extremities; no clubbing cyanosis or edema skin warm and dry Neuro/Psych; alert and oriented x4, grossly nonfocal mood and affect appropriate       Assessment & Plan:   #53 65 year old white female with Crohn's colitis, maintained on Entyvio over the past 2-1/2 years.  She has had clinical improvement in symptoms though continues with mild diarrhea she does not have any other active symptoms. Last colonoscopy December 2019 showed improvement in activity of disease after initiation of Entyvio.  #2 hypertrophied anal papillae noted on prior colonoscopy, and on exam today.  This was discussed with the patient and she was reassured  #3 hepatitis B core antibody reactive-follow-up labs pending per [facility] medical infusion center  Plan;  will ask [facility] to fax results of her follow-up hepatitis labs Patient is currently doing well, will discuss further with Dr. Havery Moros regarding need for follow-up colonoscopy, and timing. Continue Entyvio 300 mg infusion monthly.   Jillana Selph Genia Harold PA-C 07/20/2020   Cc: Josetta Huddle, MD

## 2020-07-21 NOTE — Progress Notes (Signed)
Agree with assessment and plan as outlined. Given her colonic stricturing disease and that it has been since 2019 for her last colonoscopy, I think reasonable to proceed with colonoscopy at this time for surveillance purposes. Amy can you please forward to nursing staff to get her scheduled if she is willing to proceed? Thanks

## 2020-07-26 NOTE — Progress Notes (Signed)
Robin [name] - I saw this pt last week for follow up of Crohns - I discussed with Dr Havery Moros and he would like to do a follow up Colonoscopy at this point (Which I told pt was likely ) - please call her and get her scheduled for Colon with Dr Havery Moros - thanks

## 2020-07-27 ENCOUNTER — Telehealth: Payer: Self-pay

## 2020-07-27 NOTE — Telephone Encounter (Signed)
Called the patient to discuss and schedule her colonoscopy due to Crohn's. No answer. Left her a voicemail. Openings are in August.

## 2020-07-27 NOTE — Telephone Encounter (Addendum)
  Filed: 07/26/2020 12:40 PM Encounter Date: 07/19/2020 Status: Signed  Editor: Leotis Pain (Physician Assistant Certified)      Beth - I saw this pt last week for follow up of Crohns - I discussed with Dr Havery Moros and he would like to do a follow up Colonoscopy at this point (Which I told pt was likely ) - please call her and get her scheduled for Colon with Dr Havery Moros - thanks

## 2020-07-28 DIAGNOSIS — K5090 Crohn's disease, unspecified, without complications: Secondary | ICD-10-CM | POA: Diagnosis not present

## 2020-07-28 NOTE — Telephone Encounter (Addendum)
Deductible n/a  OOP MAX $6000 ($185.33mt)  Annual exam 01/06/2020  Calcium 9.0             Date 08/19/2019  Upcoming dental procedures NO  Prior Authorization needed YES approved valid 07/28/2020-07/27/2021   Pt estimated Cost $100   APPT 08/10/2020   Coverage Details: $50 ONE DOSE, $50 ADMIN FEE

## 2020-08-01 ENCOUNTER — Other Ambulatory Visit: Payer: Self-pay

## 2020-08-01 NOTE — Telephone Encounter (Signed)
Patient is not returning the call. Letter mailed to the patient.

## 2020-08-10 ENCOUNTER — Other Ambulatory Visit: Payer: Self-pay

## 2020-08-10 ENCOUNTER — Ambulatory Visit (INDEPENDENT_AMBULATORY_CARE_PROVIDER_SITE_OTHER): Payer: BC Managed Care – PPO | Admitting: Gynecology

## 2020-08-10 DIAGNOSIS — M810 Age-related osteoporosis without current pathological fracture: Secondary | ICD-10-CM | POA: Diagnosis not present

## 2020-08-10 MED ORDER — DENOSUMAB 60 MG/ML ~~LOC~~ SOSY
60.0000 mg | PREFILLED_SYRINGE | Freq: Once | SUBCUTANEOUS | Status: AC
  Administered 2020-08-10: 60 mg via SUBCUTANEOUS

## 2020-08-18 ENCOUNTER — Other Ambulatory Visit: Payer: Self-pay | Admitting: *Deleted

## 2020-08-18 DIAGNOSIS — N6092 Unspecified benign mammary dysplasia of left breast: Secondary | ICD-10-CM

## 2020-08-20 NOTE — Progress Notes (Signed)
Robin [name]  [phone] Fax:(336) [phone]     ID: Robin [name] DOB: 08-Aug-1955  MR#: [id number]  IOX#:[id number]  Patient Care Team: Josetta Huddle, MD as PCP - General (Internal Medicine) Rolm Bookbinder, MD as Consulting Physician (General Surgery) Jillianna Stanek, Virgie Dad, MD as Consulting Physician (Oncology) Harriett Sine, MD as Consulting Physician (Dermatology) Stanford Breed Denice Bors, MD as Consulting Physician (Cardiology) Armbruster, Carlota Raspberry, MD as Consulting Physician (Gastroenterology) OTHER MD:   CHIEF COMPLAINT: atypical lobular hyperplasia  CURRENT TREATMENT:  observation   INTERVAL HISTORY: [name] returns today for follow-up of her history of atypical lobular hyperplasia and osteoporosis. She continues under observation.  She is scheduled for routine screening mammogram on 09/01/2020.   REVIEW OF SYSTEMS: [name] continues to spend quite a bit of time at her pool and as a result has significant actinic changes.  She does see Dr. Salley Scarlet every 3 months she says.  She is concerned that she has been advised to take Eliquis for A. fib and that is discussed below.  For exercise she enjoys hiking and fishing.  A detailed review of systems today was otherwise stable  COVID 19 VACCINATION STATUS: [place] x2, most recently 05/2019   HISTORY OF CURRENT ILLNESS: from the original intake note:  Robin [name] herself noted a change in her left breast and brought it to Dr. Christia Reading Fontaine's attention. Hhe set her up for diagnostic left breast mammogram with tomography and left breast ultrasonography 07/17/2015. This found the breast density to be category C.there was a subtle area of distortion in the left breast upper outer quadrant which was not palpable on examination. Ultrasonography found no definite correlate. Tomosynthesis guided biopsy was performed 07/27/2015, and showed ([id number]) atypical lobular hyperplasia.  The patient was evaluated by surgery  and after appropriate discussion excisional biopsy was performed 09/25/2015. This showed ([id number] [phone]) a complex sclerosing lesion with the usual ductal hyperplasia but also again atypical lobular hyperplasia.  The patient's subsequent history is as detailed below   PAST MEDICAL HISTORY: Past Medical History:  Diagnosis Date   Anemia    Anxiety    Arthritis    Asthma    seasonal   Atypical lobular hyperplasia (ALH) of left breast 07/2015   Cancer ([place])    skin   Cataract    Crohn's disease ([place])    severe colonic stricturing   GERD (gastroesophageal reflux disease)    Hypertension    Hypothyroidism    IUD (intrauterine device) in place 1983   Safety coil per Dr. Valeta Harms note attempted to retrieve but unable to do so   Osteoporosis 12/2016   T score -2.7 stable from prior DEXA on Prolia   Thyroid disease    Hypothyroid    PAST SURGICAL HISTORY: Past Surgical History:  Procedure Laterality Date   Basal and squamous cell Ca excised     BREAST BIOPSY Left 2018   BREAST EXCISIONAL BIOPSY Left 2018   COLONOSCOPY N/A 07/01/2013   Procedure: COLONOSCOPY;  Surgeon: Garlan Fair, MD;  Location: [facility];  Service: Endoscopy;  Laterality: N/A;   COLONOSCOPY     Fissure surg     RADIOACTIVE SEED GUIDED EXCISIONAL BREAST BIOPSY Left 09/25/2015   Procedure: RADIOACTIVE SEED GUIDED EXCISIONAL BREAST BIOPSY;  Surgeon: Rolm Bookbinder, MD;  Location: [facility];  Service: General;  Laterality: Left;   TRIGGER FINGER RELEASE Right 02/01/2016   Procedure: RELEASE TRIGGER FINGER/A-1 PULLEY, right thumb;  Surgeon: Daryll Brod, MD;  Location: [facility];  Service: Orthopedics;  Laterality: Right;  FAB    FAMILY HISTORY Family History  Problem Relation Age of Onset   Colon cancer Mother    Heart disease Mother        Atrial fibrillation   Crohn's disease Mother    Heart disease Maternal Grandmother    Hypertension Maternal Grandmother    Breast  cancer Paternal Grandmother        age unknown   Esophageal cancer Neg Hx    Liver cancer Neg Hx    Pancreatic cancer Neg Hx    Rectal cancer Neg Hx    Stomach cancer Neg Hx   the patient has no information regarding her father. Her mother passed away in 2769 at 65 years old from small cell lung cancer; she had a history of colon polyps. There are no siblings. There is no history of breast or ovarian cancer in the family.   GYNECOLOGIC HISTORY:  No LMP recorded. Patient is postmenopausal. [name] does not recall when she had her first period. She is GX P0. She never used hormone replacement or oral contraceptives   SOCIAL HISTORY:  [name] works in Press photographer, currently Publishing copy in estates. Her husband Wille Glaser is in the roofing business. At home is just the 2 of them and 2 cats.    ADVANCED DIRECTIVES: in place   HEALTH MAINTENANCE: Social History   Tobacco Use   Smoking status: Never   Smokeless tobacco: Never  Vaping Use   Vaping Use: Never used  Substance Use Topics   Alcohol use: Yes    Comment: Occasional   Drug use: No     Colonoscopy: UTD/ [facility]  PAP: 12/2017, Fontaine  Bone density: 12/2016, -2.7 (stable, on prolia)   Allergies  Allergen Reactions   Codeine Nausea Only    Needs something for nausea to go with pain pill   Tetracyclines & Related Diarrhea   Biaxin [Clarithromycin] Palpitations   Clarithromycin Palpitations    Current Outpatient Medications  Medication Sig Dispense Refill   denosumab (PROLIA) 60 MG/ML SOLN injection Inject 60 mg into the skin every 6 (six) months. Administer in upper arm, thigh, or abdomen     losartan-hydrochlorothiazide (HYZAAR) 100-12.5 MG tablet Take 1 tablet by mouth daily.     naproxen sodium (ALEVE) 220 MG tablet Take 220 mg by mouth daily as needed.     SYNTHROID 75 MCG tablet Take 1 tablet (75 mcg total) by mouth daily before breakfast. 90 tablet 3   vedolizumab (ENTYVIO) 300 MG injection Inject into the vein.      verapamil (VERELAN PM) 180 MG 24 hr capsule TAKE 2 CAPSULES (360 MG TOTAL) BY MOUTH DAILY. 180 capsule 3   Current Facility-Administered Medications  Medication Dose Route Frequency Provider Last Rate Last Admin   0.9 %  sodium chloride infusion  500 mL Intravenous Once Armbruster, Carlota Raspberry, MD        OBJECTIVE:  white woman who appears younger than stated Vitals:   08/21/20 0952  BP: 135/76  Pulse: 83  Resp: 18  Temp: 98.1 F (36.7 C)  SpO2: 98%      Body mass index is 28.89 kg/m.    ECOG FS:0 - Asymptomatic  Sclerae unicteric, EOMs intact Wearing a mask No cervical or supraclavicular adenopathy Lungs no rales or rhonchi Heart regular rate and rhythm Abd soft, nontender, positive bowel sounds MSK no focal spinal tenderness, no upper extremity lymphedema Neuro: nonfocal, well oriented, appropriate affect Breasts: The  right breast is benign.  The left breast is status post lumpectomy.  The cosmetic result is excellent.  Both breasts are quite suntanned.  There are no suspicious masses or other skin changes.  Both axillae are benign  LAB RESULTS:   CMP     Component Value Date/Time   NA 141 08/21/2020 0941   NA 139 07/22/2019 0927   K 5.0 08/21/2020 0941   CL 106 08/21/2020 0941   CO2 25 08/21/2020 0941   GLUCOSE 93 08/21/2020 0941   BUN 14 08/21/2020 0941   BUN 14 07/22/2019 0927   CREATININE 0.84 08/21/2020 0941   CALCIUM 9.2 08/21/2020 0941   PROT 7.3 08/21/2020 0941   ALBUMIN 3.8 08/21/2020 0941   AST 25 08/21/2020 0941   ALT 16 08/21/2020 0941   ALKPHOS 55 08/21/2020 0941   BILITOT 0.5 08/21/2020 0941   GFRNONAA >60 08/21/2020 0941   GFRAA >60 08/19/2019 1332    INo results found for: SPEP, UPEP  Lab Results  Component Value Date   WBC 9.2 08/21/2020   NEUTROABS 5.1 08/21/2020   HGB 12.7 08/21/2020   HCT 39.3 08/21/2020   MCV 90.6 08/21/2020   PLT 434 (H) 08/21/2020      Chemistry      Component Value Date/Time   NA 141 08/21/2020 0941   NA  139 07/22/2019 0927   K 5.0 08/21/2020 0941   CL 106 08/21/2020 0941   CO2 25 08/21/2020 0941   BUN 14 08/21/2020 0941   BUN 14 07/22/2019 0927   CREATININE 0.84 08/21/2020 0941      Component Value Date/Time   CALCIUM 9.2 08/21/2020 0941   ALKPHOS 55 08/21/2020 0941   AST 25 08/21/2020 0941   ALT 16 08/21/2020 0941   BILITOT 0.5 08/21/2020 0941       No results found for: LABCA2  No components found for: PIRJJ884  No results for input(s): INR in the last 168 hours.  Urinalysis    Component Value Date/Time   COLORURINE YELLOW 08/16/2015 0006   APPEARANCEUR CLEAR 08/16/2015 0006   LABSPEC 1.022 08/16/2015 0006   PHURINE 5.5 08/16/2015 0006   GLUCOSEU NEGATIVE 08/16/2015 0006   HGBUR LARGE (A) 08/16/2015 0006   BILIRUBINUR NEGATIVE 08/16/2015 0006   KETONESUR NEGATIVE 08/16/2015 0006   PROTEINUR NEGATIVE 08/16/2015 0006   UROBILINOGEN 0.2 12/16/2013 0854   NITRITE NEGATIVE 08/16/2015 0006   LEUKOCYTESUR MODERATE (A) 08/16/2015 0006    STUDIES: No results found.   ELIGIBLE FOR AVAILABLE RESEARCH PROTOCOL: no   ASSESSMENT: 65 y.o. [place], [place] woman Status post left breast upper outer quadrant biopsy 07/27/2015 showing atypical lobular hyperplasia  (1) left lumpectomy 09/25/2015 showed a complex sclerosing lesion and atypical lobular hyperplasia.  (2) Crohn's disease: on vedolizumab  (3) osteoporosis: Bone density [facility] 03/11/2019 showed a T score of -3.0  (4) atrial fibrillation: Considering Eliquis   PLAN: Krystle is completing 5 years of observation for her atypical lobular hyperplasia.  Of course she continues to have increased risk of developing breast cancer in either breast but after 5 years of follow-up she is sufficiently confident and has enough of an understanding of the situation that she is comfortable discontinuing follow-up here.  All she will need as far as her breast cancer screening is concerned is her yearly  mammography and a yearly physician breast exam which she will obtain through her gynecologist  She is very concerned about going on Eliquis.  From her report, she had an  episode of atrial fibrillation which was only documented by EKG at Dr. Geraldo Docker office several years ago.  She drove herself from there to the hospital and when she got to the hospital she says she was not in A. fib.  She tells me she has had a monitor for 30 days and no A. fib was documented.  We went over the benefits versus possible complications of Eliquis.  What I suggested she do is go back to Dr. Geraldo Docker office get a copy of the original EKG which is apparently the only documented strip showing atrial fibrillation, and review that with Dr. Stanford Breed.  If he agrees that that does show A. fib then my recommendation is that she go on Eliquis and we discussed how clots form in atrial fibrillation and then get shot out of the heart once the normal contraction resumes.  She seems agreeable to this plan.  I will be glad to see Jeanean again at any point in the future if and when the need arises but as of now are making no further routine appointments for her here  Total encounter time 20 minutes.*   Britiny Defrain, Virgie Dad, MD  08/21/20 10:23 AM Medical Oncology and Hematology [facility], [place] [address] Tel. [phone]    Fax. [phone]    I, Wilburn Mylar, am acting as scribe for Dr. Virgie Dad. Denni France.  I, Lurline Del MD, have reviewed the above documentation for accuracy and completeness, and I agree with the above.   *Total Encounter Time as defined by the Centers for Medicare and Medicaid Services includes, in addition to the face-to-face time of a patient visit (documented in the note above) non-face-to-face time: obtaining and reviewing outside history, ordering and reviewing medications, tests or procedures, care coordination (communications with other health care  professionals or caregivers) and documentation in the medical record.

## 2020-08-21 ENCOUNTER — Other Ambulatory Visit: Payer: Self-pay

## 2020-08-21 ENCOUNTER — Inpatient Hospital Stay: Payer: BC Managed Care – PPO

## 2020-08-21 ENCOUNTER — Inpatient Hospital Stay: Payer: BC Managed Care – PPO | Attending: Oncology | Admitting: Oncology

## 2020-08-21 VITALS — BP 135/76 | HR 83 | Temp 98.1°F | Resp 18 | Ht 64.0 in | Wt 168.3 lb

## 2020-08-21 DIAGNOSIS — Z85828 Personal history of other malignant neoplasm of skin: Secondary | ICD-10-CM | POA: Diagnosis not present

## 2020-08-21 DIAGNOSIS — K5090 Crohn's disease, unspecified, without complications: Secondary | ICD-10-CM | POA: Insufficient documentation

## 2020-08-21 DIAGNOSIS — N6092 Unspecified benign mammary dysplasia of left breast: Secondary | ICD-10-CM | POA: Insufficient documentation

## 2020-08-21 DIAGNOSIS — Z7901 Long term (current) use of anticoagulants: Secondary | ICD-10-CM | POA: Diagnosis not present

## 2020-08-21 DIAGNOSIS — Z803 Family history of malignant neoplasm of breast: Secondary | ICD-10-CM | POA: Insufficient documentation

## 2020-08-21 DIAGNOSIS — M818 Other osteoporosis without current pathological fracture: Secondary | ICD-10-CM

## 2020-08-21 DIAGNOSIS — M810 Age-related osteoporosis without current pathological fracture: Secondary | ICD-10-CM | POA: Diagnosis not present

## 2020-08-21 DIAGNOSIS — Z800 Family history of malignant neoplasm of digestive organs: Secondary | ICD-10-CM | POA: Diagnosis not present

## 2020-08-21 DIAGNOSIS — Z7289 Other problems related to lifestyle: Secondary | ICD-10-CM | POA: Diagnosis not present

## 2020-08-21 DIAGNOSIS — Z79899 Other long term (current) drug therapy: Secondary | ICD-10-CM | POA: Insufficient documentation

## 2020-08-21 DIAGNOSIS — I4891 Unspecified atrial fibrillation: Secondary | ICD-10-CM | POA: Insufficient documentation

## 2020-08-21 DIAGNOSIS — Z780 Asymptomatic menopausal state: Secondary | ICD-10-CM | POA: Insufficient documentation

## 2020-08-21 LAB — CBC WITH DIFFERENTIAL (CANCER CENTER ONLY)
Abs Immature Granulocytes: 0.04 10*3/uL (ref 0.00–0.07)
Basophils Absolute: 0.1 10*3/uL (ref 0.0–0.1)
Basophils Relative: 1 %
Eosinophils Absolute: 0.2 10*3/uL (ref 0.0–0.5)
Eosinophils Relative: 2 %
HCT: 39.3 % (ref 36.0–46.0)
Hemoglobin: 12.7 g/dL (ref 12.0–15.0)
Immature Granulocytes: 0 %
Lymphocytes Relative: 32 %
Lymphs Abs: 2.9 10*3/uL (ref 0.7–4.0)
MCH: 29.3 pg (ref 26.0–34.0)
MCHC: 32.3 g/dL (ref 30.0–36.0)
MCV: 90.6 fL (ref 80.0–100.0)
Monocytes Absolute: 0.8 10*3/uL (ref 0.1–1.0)
Monocytes Relative: 9 %
Neutro Abs: 5.1 10*3/uL (ref 1.7–7.7)
Neutrophils Relative %: 56 %
Platelet Count: 434 10*3/uL — ABNORMAL HIGH (ref 150–400)
RBC: 4.34 MIL/uL (ref 3.87–5.11)
RDW: 13.1 % (ref 11.5–15.5)
WBC Count: 9.2 10*3/uL (ref 4.0–10.5)
nRBC: 0 % (ref 0.0–0.2)

## 2020-08-21 LAB — CMP (CANCER CENTER ONLY)
ALT: 16 U/L (ref 0–44)
AST: 25 U/L (ref 15–41)
Albumin: 3.8 g/dL (ref 3.5–5.0)
Alkaline Phosphatase: 55 U/L (ref 38–126)
Anion gap: 10 (ref 5–15)
BUN: 14 mg/dL (ref 8–23)
CO2: 25 mmol/L (ref 22–32)
Calcium: 9.2 mg/dL (ref 8.9–10.3)
Chloride: 106 mmol/L (ref 98–111)
Creatinine: 0.84 mg/dL (ref 0.44–1.00)
GFR, Estimated: >60 mL/min (ref >60)
Glucose, Bld: 93 mg/dL (ref 70–99)
Potassium: 5 mmol/L (ref 3.5–5.1)
Sodium: 141 mmol/L (ref 135–145)
Total Bilirubin: 0.5 mg/dL (ref 0.3–1.2)
Total Protein: 7.3 g/dL (ref 6.5–8.1)

## 2020-08-25 DIAGNOSIS — K5090 Crohn's disease, unspecified, without complications: Secondary | ICD-10-CM | POA: Diagnosis not present

## 2020-09-01 ENCOUNTER — Other Ambulatory Visit: Payer: Self-pay

## 2020-09-01 ENCOUNTER — Ambulatory Visit
Admission: RE | Admit: 2020-09-01 | Discharge: 2020-09-01 | Disposition: A | Discharge status: Home / Self Care | Payer: BC Managed Care – PPO | Source: Ambulatory Visit | Attending: Internal Medicine | Admitting: Internal Medicine

## 2020-09-01 DIAGNOSIS — Z1231 Encounter for screening mammogram for malignant neoplasm of breast: Secondary | ICD-10-CM

## 2020-09-16 ENCOUNTER — Other Ambulatory Visit: Payer: Self-pay | Admitting: Cardiology

## 2020-09-16 DIAGNOSIS — I10 Essential (primary) hypertension: Secondary | ICD-10-CM

## 2020-09-16 DIAGNOSIS — I4891 Unspecified atrial fibrillation: Secondary | ICD-10-CM

## 2020-09-22 DIAGNOSIS — K5090 Crohn's disease, unspecified, without complications: Secondary | ICD-10-CM | POA: Diagnosis not present

## 2020-10-06 DIAGNOSIS — D485 Neoplasm of uncertain behavior of skin: Secondary | ICD-10-CM | POA: Diagnosis not present

## 2020-10-06 DIAGNOSIS — L570 Actinic keratosis: Secondary | ICD-10-CM | POA: Diagnosis not present

## 2020-10-06 DIAGNOSIS — D692 Other nonthrombocytopenic purpura: Secondary | ICD-10-CM | POA: Diagnosis not present

## 2020-10-06 DIAGNOSIS — L814 Other melanin hyperpigmentation: Secondary | ICD-10-CM | POA: Diagnosis not present

## 2020-10-06 DIAGNOSIS — C44319 Basal cell carcinoma of skin of other parts of face: Secondary | ICD-10-CM | POA: Diagnosis not present

## 2020-10-06 DIAGNOSIS — Z85828 Personal history of other malignant neoplasm of skin: Secondary | ICD-10-CM | POA: Diagnosis not present

## 2020-10-06 DIAGNOSIS — L821 Other seborrheic keratosis: Secondary | ICD-10-CM | POA: Diagnosis not present

## 2020-10-19 DIAGNOSIS — R000 Tachycardia, unspecified: Secondary | ICD-10-CM | POA: Diagnosis not present

## 2020-10-19 DIAGNOSIS — Z0001 Encounter for general adult medical examination with abnormal findings: Secondary | ICD-10-CM | POA: Diagnosis not present

## 2020-10-19 DIAGNOSIS — I10 Essential (primary) hypertension: Secondary | ICD-10-CM | POA: Diagnosis not present

## 2020-10-19 DIAGNOSIS — E7800 Pure hypercholesterolemia, unspecified: Secondary | ICD-10-CM | POA: Diagnosis not present

## 2020-10-19 DIAGNOSIS — K50118 Crohn's disease of large intestine with other complication: Secondary | ICD-10-CM | POA: Diagnosis not present

## 2020-10-19 DIAGNOSIS — R635 Abnormal weight gain: Secondary | ICD-10-CM | POA: Diagnosis not present

## 2020-10-19 DIAGNOSIS — E559 Vitamin D deficiency, unspecified: Secondary | ICD-10-CM | POA: Diagnosis not present

## 2020-10-20 DIAGNOSIS — K5090 Crohn's disease, unspecified, without complications: Secondary | ICD-10-CM | POA: Diagnosis not present

## 2020-11-13 DIAGNOSIS — C44319 Basal cell carcinoma of skin of other parts of face: Secondary | ICD-10-CM | POA: Diagnosis not present

## 2020-11-24 DIAGNOSIS — K5090 Crohn's disease, unspecified, without complications: Secondary | ICD-10-CM | POA: Diagnosis not present

## 2020-12-15 ENCOUNTER — Telehealth: Payer: Self-pay | Admitting: *Deleted

## 2020-12-15 NOTE — Telephone Encounter (Signed)
[place] GIVEN 08/10/2020 NEXT INJECTION 02/10/2021

## 2020-12-15 NOTE — Telephone Encounter (Signed)
Prolia insurance verification has been sent awaiting Summary of benefits

## 2020-12-22 DIAGNOSIS — K5090 Crohn's disease, unspecified, without complications: Secondary | ICD-10-CM | POA: Diagnosis not present

## 2020-12-27 NOTE — Telephone Encounter (Addendum)
Deductible n/a  OOP MAX n/a  Annual exam upcoming 01/09/2021  Calcium  9.2           Date 08/21/2020  Upcoming dental procedures no  Prior Authorization needed  YES approved valid 07/28/2020-07/27/2021   Pt estimated Cost $0   APPT 02/12/2021    Coverage Details:0% one dose, 0% admin fee

## 2021-01-01 DIAGNOSIS — H2513 Age-related nuclear cataract, bilateral: Secondary | ICD-10-CM | POA: Diagnosis not present

## 2021-01-01 DIAGNOSIS — H04123 Dry eye syndrome of bilateral lacrimal glands: Secondary | ICD-10-CM | POA: Diagnosis not present

## 2021-01-01 DIAGNOSIS — H35371 Puckering of macula, right eye: Secondary | ICD-10-CM | POA: Diagnosis not present

## 2021-01-01 DIAGNOSIS — H43811 Vitreous degeneration, right eye: Secondary | ICD-10-CM | POA: Diagnosis not present

## 2021-01-02 DIAGNOSIS — L821 Other seborrheic keratosis: Secondary | ICD-10-CM | POA: Diagnosis not present

## 2021-01-02 DIAGNOSIS — L570 Actinic keratosis: Secondary | ICD-10-CM | POA: Diagnosis not present

## 2021-01-02 DIAGNOSIS — L814 Other melanin hyperpigmentation: Secondary | ICD-10-CM | POA: Diagnosis not present

## 2021-01-02 DIAGNOSIS — Z85828 Personal history of other malignant neoplasm of skin: Secondary | ICD-10-CM | POA: Diagnosis not present

## 2021-01-09 ENCOUNTER — Ambulatory Visit: Payer: BC Managed Care – PPO | Admitting: Nurse Practitioner

## 2021-01-26 DIAGNOSIS — K5090 Crohn's disease, unspecified, without complications: Secondary | ICD-10-CM | POA: Diagnosis not present

## 2021-02-12 ENCOUNTER — Other Ambulatory Visit: Payer: Self-pay

## 2021-02-12 ENCOUNTER — Ambulatory Visit (INDEPENDENT_AMBULATORY_CARE_PROVIDER_SITE_OTHER): Payer: BC Managed Care – PPO

## 2021-02-12 VITALS — BP 126/82 | HR 90 | Wt 168.0 lb

## 2021-02-12 DIAGNOSIS — M810 Age-related osteoporosis without current pathological fracture: Secondary | ICD-10-CM

## 2021-02-12 MED ORDER — DENOSUMAB 60 MG/ML ~~LOC~~ SOSY
60.0000 mg | PREFILLED_SYRINGE | Freq: Once | SUBCUTANEOUS | Status: AC
  Administered 2021-02-12: 10:00:00 60 mg via SUBCUTANEOUS

## 2021-02-12 NOTE — Progress Notes (Signed)
Patient in today for  Prolia injection. Patient's last calcium level was obtained on 08-21-20.  Result: 9.2.  Last AEX: 01-06-20, next AEX 02-22-21 with TW Last BMD: 03-11-19   Injection given in right arm.  Patient tolerated injection well.  Routed to provider for review.

## 2021-02-14 NOTE — Telephone Encounter (Signed)
Patient received prolia on 02-12-21. Summary of benefits scanned into Epic.   Encounter closed.

## 2021-02-22 ENCOUNTER — Other Ambulatory Visit (HOSPITAL_COMMUNITY)
Admission: RE | Admit: 2021-02-22 | Discharge: 2021-02-22 | Disposition: A | Discharge status: Home / Self Care | Payer: BC Managed Care – PPO | Source: Ambulatory Visit | Attending: Nurse Practitioner | Admitting: Nurse Practitioner

## 2021-02-22 ENCOUNTER — Other Ambulatory Visit: Payer: Self-pay

## 2021-02-22 ENCOUNTER — Encounter: Payer: Self-pay | Admitting: Nurse Practitioner

## 2021-02-22 ENCOUNTER — Ambulatory Visit (INDEPENDENT_AMBULATORY_CARE_PROVIDER_SITE_OTHER): Payer: BC Managed Care – PPO | Admitting: Nurse Practitioner

## 2021-02-22 VITALS — BP 128/88 | HR 86 | Ht 65.75 in | Wt 172.0 lb

## 2021-02-22 DIAGNOSIS — Z780 Asymptomatic menopausal state: Secondary | ICD-10-CM | POA: Diagnosis not present

## 2021-02-22 DIAGNOSIS — Z01419 Encounter for gynecological examination (general) (routine) without abnormal findings: Secondary | ICD-10-CM | POA: Insufficient documentation

## 2021-02-22 DIAGNOSIS — M810 Age-related osteoporosis without current pathological fracture: Secondary | ICD-10-CM | POA: Diagnosis not present

## 2021-02-22 NOTE — Progress Notes (Signed)
Robin [name] 07-06-1955 [id number]   History:  65 y.o. G0 presents for annual exam. Postmenopausal - no HRT, no bleeding. Normal pap history. History of atypical ductal hyperplasia in 2017, actively follows with Dr. Jana Hakim. Osteoporosis - on Prolia. History of HTN, hypothyroidism, Crohn's, basal and squamous cell skin cancer - sees derm every 3 months.   Gynecologic History No LMP recorded. Patient is postmenopausal.   Contraception/Family planning: post menopausal status Sexually active: No  Health Maintenance Last Pap: 01/01/2018. Results were: Normal, 3-year repeat Last mammogram: 09/01/2020. Results were: Normal Last colonoscopy: 02/09/2018. Results were: Benign polyps, ulcers present. Annually Last Dexa: 03/11/2019. Results were: T-score -3.0 in right hip (all other sites osteopenic)  Past medical history, past surgical history, family history and social history were all reviewed and documented in the EPIC chart. Married. Works for [employer]   ROS:  A ROS was performed and pertinent positives and negatives are included.  Exam:  Vitals:   02/22/21 0918  BP: 128/88  Pulse: 86  SpO2: 98%  Weight: 172 lb (78 kg)  Height: 5' 5.75" (1.67 m)   Body mass index is 27.97 kg/m.  General appearance:  Normal Thyroid:  Symmetrical, normal in size, without palpable masses or nodularity. Respiratory  Auscultation:  Clear without wheezing or rhonchi Cardiovascular  Auscultation:  Regular rate, without rubs, murmurs or gallops  Edema/varicosities:  Not grossly evident Abdominal  Soft,nontender, without masses, guarding or rebound.  Liver/spleen:  No organomegaly noted  Hernia:  None appreciated  Skin  Inspection:  Grossly normal. Many nevi (sees derm) Breasts: Examined lying and sitting.   Right: Without masses, retractions, nipple discharge or axillary adenopathy.   Left: Without masses, retractions, nipple discharge or axillary adenopathy. Genitourinary    Inguinal/mons:  Normal without inguinal adenopathy  External genitalia:  Normal appearing vulva with no masses, tenderness, or lesions  BUS/Urethra/Skene's glands:  Normal  Vagina:  Normal appearing with normal color and discharge, no lesions. Atrophic changes  Cervix:  Normal appearing without discharge or lesions  Uterus:  Normal in size, shape and contour.  Midline and mobile, nontender  Adnexa/parametria:     Rt: Normal in size, without masses or tenderness.   Lt: Normal in size, without masses or tenderness.  Anus and perineum: Normal  Digital rectal exam: Normal sphincter tone without palpated masses or tenderness  Patient informed chaperone available to be present for breast and pelvic exam. Patient has requested no chaperone to be present. Patient has been advised what will be completed during breast and pelvic exam.   Assessment/Plan:  65 y.o. G0 for annual exam.   Well female exam with routine gynecological exam - Plan: Cytology - PAP( [place]). Education provided on SBEs, importance of preventative screenings, current guidelines, high calcium diet, regular exercise, and multivitamin daily.  Labs with PCP.   Postmenopausal - Plan: DG Bone Density. No HRT, no bleeding.  Age-related osteoporosis without current pathological fracture - Plan: DG Bone Density. T-score -3.0 (January 2021). On Prolia, most recent dose 02/12/2021. Will schedule DXA for next month.   Screening for cervical cancer - Normal Pap history. Pap today. If normal we discussed current guidelines and option to stop screenings and she is agreeable.   Screening for breast cancer - 2017 atypical ductal hyperplasia, actively follows with Dr. Jana Hakim. Continue annual screenings.  Normal breast exam today.  Screening for colon cancer - 01/2018 colonoscopy. Annual colonoscopies recommended d/t Crohn's. She is overdue and plans to schedule soon.  Return in 1 year for annual.   Tamela Gammon DNP, 9:53 AM  02/22/2021

## 2021-02-23 LAB — CYTOLOGY - PAP: Diagnosis: NEGATIVE

## 2021-03-02 DIAGNOSIS — K5090 Crohn's disease, unspecified, without complications: Secondary | ICD-10-CM | POA: Diagnosis not present

## 2021-04-06 DIAGNOSIS — K5090 Crohn's disease, unspecified, without complications: Secondary | ICD-10-CM | POA: Diagnosis not present

## 2021-04-10 DIAGNOSIS — L821 Other seborrheic keratosis: Secondary | ICD-10-CM | POA: Diagnosis not present

## 2021-04-10 DIAGNOSIS — L570 Actinic keratosis: Secondary | ICD-10-CM | POA: Diagnosis not present

## 2021-04-10 DIAGNOSIS — D2339 Other benign neoplasm of skin of other parts of face: Secondary | ICD-10-CM | POA: Diagnosis not present

## 2021-04-10 DIAGNOSIS — D485 Neoplasm of uncertain behavior of skin: Secondary | ICD-10-CM | POA: Diagnosis not present

## 2021-04-10 DIAGNOSIS — Z85828 Personal history of other malignant neoplasm of skin: Secondary | ICD-10-CM | POA: Diagnosis not present

## 2021-04-10 DIAGNOSIS — D0462 Carcinoma in situ of skin of left upper limb, including shoulder: Secondary | ICD-10-CM | POA: Diagnosis not present

## 2021-04-10 DIAGNOSIS — L814 Other melanin hyperpigmentation: Secondary | ICD-10-CM | POA: Diagnosis not present

## 2021-04-24 ENCOUNTER — Other Ambulatory Visit: Payer: Self-pay

## 2021-04-24 ENCOUNTER — Other Ambulatory Visit: Payer: Self-pay | Admitting: Nurse Practitioner

## 2021-04-24 ENCOUNTER — Ambulatory Visit (INDEPENDENT_AMBULATORY_CARE_PROVIDER_SITE_OTHER): Payer: BC Managed Care – PPO

## 2021-04-24 DIAGNOSIS — Z780 Asymptomatic menopausal state: Secondary | ICD-10-CM

## 2021-04-24 DIAGNOSIS — M8589 Other specified disorders of bone density and structure, multiple sites: Secondary | ICD-10-CM | POA: Diagnosis not present

## 2021-04-24 DIAGNOSIS — M810 Age-related osteoporosis without current pathological fracture: Secondary | ICD-10-CM

## 2021-04-25 ENCOUNTER — Encounter: Payer: Self-pay | Admitting: Gastroenterology

## 2021-04-25 ENCOUNTER — Telehealth: Payer: Self-pay | Admitting: Gastroenterology

## 2021-04-25 NOTE — Telephone Encounter (Signed)
[place] I think that is okay to write her a letter for this but she is also overdue for a follow up clinic visit with me if you can help coordinate routine office visit as well. Thanks ?

## 2021-04-25 NOTE — Telephone Encounter (Signed)
Inbound call from patient stated that she has jury duty on March 27th and is in need of a letter from Dr. Havery Moros stated that issues that she has with Crohn's disease. Patient stated that she has to use the bathroom a lot and will not be able to get up to go during jury duty. Patient stated that if Dr. Havery Moros is able to do the letter that she will come by to pick it up. Patient would like a call to discuss this matter if it can be done. ?

## 2021-04-25 NOTE — Telephone Encounter (Signed)
Called and spoke with patient, she is aware that I will complete her letter, but I need her juror #. She is currently at work and will call back in the morning to provide her juror # and to schedule a follow up appt with Dr. Havery Moros after her colonoscopy. Pt is aware that she will be able to pick up the letter this week from the 2nd floor receptionist desk. Pt was so thankful for the letter. She had no concerns at the end of the call. ?

## 2021-04-25 NOTE — Telephone Encounter (Signed)
Will compose letter. Just wanted to make you aware that pt called in and scheduled her recall colonoscopy. Her pre-visit is on 05/31/21 and her colonoscopy is on 06/21/21. Would you still like her to come in for an office visit? If so, prior to or after colonoscopy? Thanks ?

## 2021-04-25 NOTE — Telephone Encounter (Signed)
She can come before or after, whichever her preference. Thanks ?

## 2021-04-26 NOTE — Telephone Encounter (Signed)
Pt returned call. She provided me with her Juror # [id number]. Pt has been scheduled for a f/u appt with Dr. Havery Moros on Tuesday, 07/03/21 at 8:10 am. Pt will come by the 2nd floor receptionist desk tomorrow to pick up her letter. She will bring her new insurance card at that time as well. Pt had no other concerns at the end of the call. ?

## 2021-05-04 NOTE — Telephone Encounter (Signed)
Inbound call from patient requesting another excuse letter please.  Please advise. ?

## 2021-05-04 NOTE — Telephone Encounter (Signed)
Returned call to patient. She states that she picked up the previous letter of excuse for [employer] but they are wanting a letter that states this is a more "permanent" condition. She stated that she wasn't sure if they misinterpreted the letter. I have written a new letter. Pt will pick up the letter on Monday from the 2nd floor receptionist desk. Pt had no other concerns at the end of the call. ?

## 2021-05-07 NOTE — Telephone Encounter (Signed)
Pt arrived today to pick up her letter. Pt requested permanent excusal from the [place] pool. I updated the letter and gave it to pt while she was in the office today. ?

## 2021-05-31 ENCOUNTER — Ambulatory Visit (AMBULATORY_SURGERY_CENTER): Payer: 59 | Admitting: *Deleted

## 2021-05-31 VITALS — Ht 66.0 in | Wt 162.0 lb

## 2021-05-31 DIAGNOSIS — K50119 Crohn's disease of large intestine with unspecified complications: Secondary | ICD-10-CM

## 2021-05-31 DIAGNOSIS — Z800 Family history of malignant neoplasm of digestive organs: Secondary | ICD-10-CM

## 2021-05-31 MED ORDER — ONDANSETRON HCL 4 MG PO TABS: 4.0000 mg | ORAL_TABLET | ORAL | 0 refills | Status: DC

## 2021-05-31 MED ORDER — NA SULFATE-K SULFATE-MG SULF 17.5-3.13-1.6 GM/177ML PO SOLN: 1.0000 | Freq: Once | ORAL | 0 refills | Status: AC

## 2021-05-31 NOTE — Progress Notes (Signed)
No egg or soy allergy known to patient  ?No issues known to pt with past sedation with any surgeries or procedures ?Patient denies ever being told they had issues or difficulty with intubation  ?No FH of Malignant Hyperthermia ?Pt is not on diet pills ?Pt is not on  home 02  ?Pt is not on blood thinners  ?Pt denies issues with constipation  ?POSSIBLE A fib EPISODE IN THE PAST  ?ZOFRAN SENT IN PER PROTOCOL FOR NAUSEA PREVENTION PER PT REQUEST  LAST COLON NAUSEA WITH PREP ?  NO PA's for preps discussed with pt In PV today  ?Discussed with pt there will be an out-of-pocket cost for prep and that varies from $0 to 70 +  dollars - pt verbalized understanding  ? ?Due to the COVID-19 pandemic we are asking patients to follow certain guidelines in PV and the [place]   ?Pt aware of COVID protocols and [facility] guidelines  ? ?PV completed over the phone. Pt verified name, DOB, address and insurance during PV today.  ?Pt mailed instruction packet with copy of consent form to read and not return, and instructions.  ?Pt encouraged to call with questions or issues.  ? ?

## 2021-06-12 ENCOUNTER — Encounter: Payer: Self-pay | Admitting: Gastroenterology

## 2021-06-13 ENCOUNTER — Encounter: Payer: Self-pay | Admitting: Certified Registered Nurse Anesthetist

## 2021-06-20 ENCOUNTER — Encounter: Payer: Self-pay | Admitting: Certified Registered Nurse Anesthetist

## 2021-06-21 ENCOUNTER — Encounter: Payer: Self-pay | Admitting: Gastroenterology

## 2021-06-21 ENCOUNTER — Ambulatory Visit (AMBULATORY_SURGERY_CENTER): Payer: 59 | Admitting: Gastroenterology

## 2021-06-21 VITALS — BP 114/71 | HR 71 | Temp 98.9°F | Resp 16 | Ht 66.0 in | Wt 162.0 lb

## 2021-06-21 DIAGNOSIS — K50119 Crohn's disease of large intestine with unspecified complications: Secondary | ICD-10-CM

## 2021-06-21 DIAGNOSIS — K5190 Ulcerative colitis, unspecified, without complications: Secondary | ICD-10-CM | POA: Diagnosis not present

## 2021-06-21 DIAGNOSIS — K6389 Other specified diseases of intestine: Secondary | ICD-10-CM | POA: Diagnosis not present

## 2021-06-21 DIAGNOSIS — K648 Other hemorrhoids: Secondary | ICD-10-CM | POA: Diagnosis not present

## 2021-06-21 MED ORDER — SODIUM CHLORIDE 0.9 % IV SOLN: 500.0000 mL | INTRAVENOUS | Status: DC

## 2021-06-21 NOTE — Op Note (Signed)
[place] ?Patient Name: Robin [name] ?Procedure Date: 06/21/2021 7:35 AM ?MRN: [id number] ?Endoscopist: Carlota Raspberry. Havery Moros , MD ?Age: 66 ?Referring MD:  ?Date of Birth: 1955/12/08 ?Gender: Female ?Account #: [id number] ?Procedure:                Colonoscopy ?Indications:              High risk colon cancer surveillance: Crohn's  ?                          colitis - longstanding disease with stricturing. On  ?                          Entyvio in recent years (previously biologic naive  ?                          - only therapy) - on every 4 week dosing. Last exam  ?                          01/2018. Clinically feeling well. ?Medicines:                Monitored Anesthesia Care ?Procedure:                Pre-Anesthesia Assessment: ?                          - Prior to the procedure, a History and Physical  ?                          was performed, and patient medications and  ?                          allergies were reviewed. The patient's tolerance of  ?                          previous anesthesia was also reviewed. The risks  ?                          and benefits of the procedure and the sedation  ?                          options and risks were discussed with the patient.  ?                          All questions were answered, and informed consent  ?                          was obtained. Prior Anticoagulants: The patient has  ?                          taken no previous anticoagulant or antiplatelet  ?                          agents. ASA Grade Assessment: II - A patient with  ?  mild systemic disease. After reviewing the risks  ?                          and benefits, the patient was deemed in  ?                          satisfactory condition to undergo the procedure. ?                          After obtaining informed consent, the colonoscope  ?                          was passed under direct vision. Throughout the  ?                          procedure, the patient's  blood pressure, pulse, and  ?                          oxygen saturations were monitored continuously. The  ?                          0441 PCF-H190TL Slim SB Colonoscope was introduced  ?                          through the anus and advanced to the the terminal  ?                          ileum, with identification of the appendiceal  ?                          orifice and IC valve. The colonoscopy was performed  ?                          without difficulty. The patient tolerated the  ?                          procedure well. The quality of the bowel  ?                          preparation was adequate. The terminal ileum,  ?                          ileocecal valve, appendiceal orifice, and rectum  ?                          were photographed. ?Scope In: 7:53:53 AM ?Scope Out: 8:14:42 AM ?Scope Withdrawal Time: 0 hours 17 minutes 15 seconds  ?Total Procedure Duration: 0 hours 20 minutes 49 seconds  ?Findings:                 The perianal and digital rectal examinations were  ?                          normal. ?  The terminal ileum appeared normal. ?                          A benign-appearing, intrinsic moderate stenosis was  ?                          found in the sigmoid colon, in the transverse colon  ?                          and in the ascending colon. Mild ulceration /  ?                          inflammatory change noted at the strictures -  ?                          stable in appearance compared to 2019 and improved  ?                          from exam pre-Entyvio. Biopsies were taken with a  ?                          cold forceps for histology of each stricture. ?                          Anal papilla(e) were hypertrophied. Biopsies were  ?                          taken with a cold forceps for histology to rule out  ?                          AIN. ?                          Internal hemorrhoids were found during  ?                          retroflexion. The hemorrhoids were  small. ?                          There was evidence of fibrotic change and scarring  ?                          from prior inflammation but no active inflammation  ?                          noted elsewhere. The exam was otherwise without  ?                          abnormality. ?                          Biopsies were taken with a cold forceps from the  ?                          right  colon, left colon and transverse colon for  ?                          Crohn's disease surveillance. These biopsy  ?                          specimens were sent to Pathology. ?Complications:            No immediate complications. Estimated blood loss:  ?                          Minimal. ?Estimated Blood Loss:     Estimated blood loss was minimal. ?Impression:               - The examined portion of the ileum was normal. ?                          - Stricture in the sigmoid colon, in the transverse  ?                          colon and in the ascending colon. Biopsied. Stable  ?                          in appearance compared to 2019 - mild inflammatory  ?                          changes at the strictures but no inflammation  ?                          elsewhere. ?                          - Anal papilla(e) were hypertrophied. Biopsied. ?                          - Internal hemorrhoids. ?                          - The examination was otherwise normal. ?                          - Biopsies for surveillance were taken from the  ?                          right colon, left colon and transverse colon. ?                          Overall stable in appearance and no worsening since  ?                          3220 - certainly improved pre-biologic therapy but  ?                          not in endoscopic remission. Patient has been  ?  hesitant to use therapies other than Entyvio in the  ?                          past, can follow up in the office and discuss long  ?                          term  options. ?Recommendation:           - Patient has a contact number available for  ?                          emergencies. The signs and symptoms of potential  ?                          delayed complications were discussed with the  ?                          patient. Return to normal activities tomorrow.  ?                          Written discharge instructions were provided to the  ?                          patient. ?                          - Resume previous diet. ?                          - Continue present medications. ?                          - Await pathology results. ?Carlota Raspberry. Havery Moros, MD ?06/21/2021 8:24:39 AM ?This report has been signed electronically. ?

## 2021-06-21 NOTE — Progress Notes (Signed)
Called to room to assist during endoscopic procedure.  Patient ID and intended procedure confirmed with present staff. Received instructions for my participation in the procedure from the performing physician.  

## 2021-06-21 NOTE — Patient Instructions (Signed)
Await pathology results. ?Resume previous diet and continue present medications. ? ? ?YOU HAD AN ENDOSCOPIC PROCEDURE TODAY AT [facility]:   Refer to the procedure report that was given to you for any specific questions about what was found during the examination.  If the procedure report does not answer your questions, please call your gastroenterologist to clarify.  If you requested that your care partner not be given the details of your procedure findings, then the procedure report has been included in a sealed envelope for you to review at your convenience later. ? ?YOU SHOULD EXPECT: Some feelings of bloating in the abdomen. Passage of more gas than usual.  Walking can help get rid of the air that was put into your GI tract during the procedure and reduce the bloating. If you had a lower endoscopy (such as a colonoscopy or flexible sigmoidoscopy) you may notice spotting of blood in your stool or on the toilet paper. If you underwent a bowel prep for your procedure, you may not have a normal bowel movement for a few days. ? ?Please Note:  You might notice some irritation and congestion in your nose or some drainage.  This is from the oxygen used during your procedure.  There is no need for concern and it should clear up in a day or so. ? ?SYMPTOMS TO REPORT IMMEDIATELY: ? ?Following lower endoscopy (colonoscopy or flexible sigmoidoscopy): ? Excessive amounts of blood in the stool ? Significant tenderness or worsening of abdominal pains ? Swelling of the abdomen that is new, acute ? Fever of 100?F or higher ? ?For urgent or emergent issues, a gastroenterologist can be reached at any hour by calling [phone]. ?Do not use MyChart messaging for urgent concerns.  ? ? ?DIET:  We do recommend a small meal at first, but then you may proceed to your regular diet.  Drink plenty of fluids but you should avoid alcoholic beverages for 24 hours. ? ?ACTIVITY:  You should plan to take it easy for the  rest of today and you should NOT DRIVE or use heavy machinery until tomorrow (because of the sedation medicines used during the test).   ? ?FOLLOW UP: ?Our staff will call the number listed on your records 48-72 hours following your procedure to check on you and address any questions or concerns that you may have regarding the information given to you following your procedure. If we do not reach you, we will leave a message.  We will attempt to reach you two times.  During this call, we will ask if you have developed any symptoms of COVID 19. If you develop any symptoms (ie: fever, flu-like symptoms, shortness of breath, cough etc.) before then, please call [phone].  If you test positive for Covid 19 in the 2 weeks post procedure, please call and report this information to [place].   ? ?If any biopsies were taken you will be contacted by phone or by letter within the next 1-3 weeks.  Please call us at [phone] if you have not heard about the biopsies in 3 weeks.  ? ? ?SIGNATURES/CONFIDENTIALITY: ?You and/or your care partner have signed paperwork which will be entered into your electronic medical record.  These signatures attest to the fact that that the information above on your After Visit Summary has been reviewed and is understood.  Full responsibility of the confidentiality of this discharge information lies with you and/or your care-partner.  ?

## 2021-06-21 NOTE — Progress Notes (Signed)
0752 Data transferring presently, vss ?

## 2021-06-21 NOTE — Progress Notes (Signed)
Report given to PACU, vss 

## 2021-06-21 NOTE — Progress Notes (Signed)
0743 Data will not transfer from monitor, vs being posted manually.   ?

## 2021-06-21 NOTE — Progress Notes (Signed)
[facility] History and Physical ? ? ?Primary Care Physician:  Josetta Huddle, MD ? ? ?Reason for Procedure:   Crohn's colitis ? ?Plan:    colonoscopy ? ? ? ? ?HPI: Robin [name] is a 66 y.o. female  here for colonoscopy surveillance of colonic Crohn's - history of longstanding disease with stricturing of the colon, on Entyvio q 4 weeks. Overdue for surveillance. Patient denies any bowel symptoms at this time at baseline, generally doing well. Otherwise feels well without any cardiopulmonary symptoms.  ? ? ?Past Medical History:  ?Diagnosis Date  ? Anemia   ? Anxiety   ? Arthritis   ? Asthma   ? seasonal  ? Atypical lobular hyperplasia [facility]) of left breast 07/2015  ? Cancer [facility])   ? skin  ? Cataract   ? FORMING  ? Crohn's disease ([place])   ? severe colonic stricturing  ? GERD (gastroesophageal reflux disease)   ? PAST HX  ? Hypertension   ? Hypothyroidism   ? IUD (intrauterine device) in place 1983  ? Safety coil per Dr. Valeta Harms note attempted to retrieve but unable to do so  ? Osteoporosis 12/2016  ? T score -2.7 stable from prior DEXA on Prolia  ? Thyroid disease   ? Hypothyroid  ? ? ?Past Surgical History:  ?Procedure Laterality Date  ? Basal and squamous cell Ca excised    ? BREAST BIOPSY Left 2018  ? BREAST EXCISIONAL BIOPSY Left 2018  ? COLONOSCOPY N/A 07/01/2013  ? Procedure: COLONOSCOPY;  Surgeon: Garlan Fair, MD;  Location: [facility];  Service: Endoscopy;  Laterality: N/A;  ? COLONOSCOPY    ? Fissure surg    ? RADIOACTIVE SEED GUIDED EXCISIONAL BREAST BIOPSY Left 09/25/2015  ? Procedure: RADIOACTIVE SEED GUIDED EXCISIONAL BREAST BIOPSY;  Surgeon: Rolm Bookbinder, MD;  Location: [place];  Service: General;  Laterality: Left;  ? TRIGGER FINGER RELEASE Right 02/01/2016  ? Procedure: RELEASE TRIGGER FINGER/A-1 PULLEY, right thumb;  Surgeon: Daryll Brod, MD;  Location: [place];  Service: Orthopedics;  Laterality: Right;  FAB  ? ? ?Prior to Admission  medications   ?Medication Sig Start Date End Date Taking? Authorizing Provider  ?losartan-hydrochlorothiazide (HYZAAR) 100-12.5 MG tablet Take 1 tablet by mouth daily. 06/22/20  Yes [provider]  ?ondansetron (ZOFRAN) 4 MG tablet Take 1 tablet (4 mg total) by mouth as directed. 05/31/21  Yes Genoveva Singleton, Carlota Raspberry, MD  ?SYNTHROID 75 MCG tablet Take 1 tablet (75 mcg total) by mouth daily before breakfast. 09/28/13  Yes Armandina Gemma, MD  ?verapamil (VERELAN PM) 180 MG 24 hr capsule Take 360 mg by mouth. 05/21/21  Yes [provider]  ?ALPRAZolam Duanne Moron) 0.25 MG tablet 1/2 tablet    [provider]  ?calcium carbonate (TUMS - DOSED IN MG ELEMENTAL CALCIUM) 500 MG chewable tablet 1 tablet    [provider]  ?denosumab (PROLIA) 60 MG/ML SOLN injection Inject 60 mg into the skin every 6 (six) months. Administer in upper arm, thigh, or abdomen    [provider]  ?naproxen sodium (ALEVE) 220 MG tablet Take 220 mg by mouth daily as needed.    [provider]  ? ? ?Current Outpatient Medications  ?Medication Sig Dispense Refill  ? losartan-hydrochlorothiazide (HYZAAR) 100-12.5 MG tablet Take 1 tablet by mouth daily.    ? ondansetron (ZOFRAN) 4 MG tablet Take 1 tablet (4 mg total) by mouth as directed. 2 tablet 0  ? SYNTHROID 75 MCG tablet Take  1 tablet (75 mcg total) by mouth daily before breakfast. 90 tablet 3  ? verapamil (VERELAN PM) 180 MG 24 hr capsule Take 360 mg by mouth.    ? ALPRAZolam (XANAX) 0.25 MG tablet 1/2 tablet    ? calcium carbonate (TUMS - DOSED IN MG ELEMENTAL CALCIUM) 500 MG chewable tablet 1 tablet    ? denosumab (PROLIA) 60 MG/ML SOLN injection Inject 60 mg into the skin every 6 (six) months. Administer in upper arm, thigh, or abdomen    ? naproxen sodium (ALEVE) 220 MG tablet Take 220 mg by mouth daily as needed.    ? ?Current Facility-Administered Medications  ?Medication Dose Route Frequency Provider Last Rate Last Admin  ? 0.9 %  sodium chloride  infusion  500 mL Intravenous Once Boruch Manuele, Carlota Raspberry, MD      ? 0.9 %  sodium chloride infusion  500 mL Intravenous Continuous Antoin Dargis, Carlota Raspberry, MD      ? ? ?Allergies as of 06/21/2021 - Review Complete 06/21/2021  ?Allergen Reaction Noted  ? Codeine Nausea Only 09/18/2015  ? Tetracyclines & related Diarrhea 07/19/2011  ? Biaxin [clarithromycin] Palpitations 07/19/2011  ? Clarithromycin Palpitations 07/20/2010  ? ? ?Family History  ?Problem Relation Age of Onset  ? Colon cancer Mother   ?     DX'D AGE 42'S  ? Heart disease Mother   ?     Atrial fibrillation  ? Crohn's disease Mother   ? Heart disease Maternal Grandmother   ? Hypertension Maternal Grandmother   ? Breast cancer Paternal Grandmother   ?     age unknown  ? Esophageal cancer Neg Hx   ? Liver cancer Neg Hx   ? Pancreatic cancer Neg Hx   ? Rectal cancer Neg Hx   ? Stomach cancer Neg Hx   ? Colon polyps Neg Hx   ? ? ?Social History  ? ?Socioeconomic History  ? Marital status: Married  ?  Spouse name: Not on file  ? Number of children: Not on file  ? Years of education: Not on file  ? Highest education level: Not on file  ?Occupational History  ? Not on file  ?Tobacco Use  ? Smoking status: Never  ? Smokeless tobacco: Never  ?Vaping Use  ? Vaping Use: Never used  ?Substance and Sexual Activity  ? Alcohol use: Yes  ?  Comment: Occasional  ? Drug use: No  ? Sexual activity: Not Currently  ?  Birth control/protection: Post-menopausal  ?  Comment: Pt. declined sexual hx questions  ?Other Topics Concern  ? Not on file  ?Social History Narrative  ? Not on file  ? ?Social Determinants of Health  ? ?Financial Resource Strain: Not on file  ?Food Insecurity: Not on file  ?Transportation Needs: Not on file  ?Physical Activity: Not on file  ?Stress: Not on file  ?Social Connections: Not on file  ?Intimate Partner Violence: Not on file  ? ? ?Review of Systems: ?All other review of systems negative except as mentioned in the HPI. ? ?Physical Exam: ?Vital signs ?BP  129/72   Pulse 73   Temp 98.9 ?F (37.2 ?C) (Temporal)   Resp 18   Ht 5' 6"  (1.676 m)   Wt 162 lb (73.5 kg)   SpO2 100%   BMI 26.15 kg/m?  ? ?General:   Alert,  Well-developed, pleasant and cooperative in NAD ?Lungs:  Clear throughout to auscultation.   ?Heart:  Regular rate and rhythm ?Abdomen:  Soft, nontender and  nondistended.   ?Neuro/Psych:  Alert and cooperative. Normal mood and affect. A and O x 3 ? ?Jolly Mango, MD ?[facility] ? ? ?

## 2021-06-25 ENCOUNTER — Telehealth: Payer: Self-pay | Admitting: *Deleted

## 2021-06-25 NOTE — Telephone Encounter (Signed)
?  Follow up Call- ? ? ?  06/21/2021  ?  7:10 AM  ?Call back number  ?Post procedure Call Back phone  # [phone]  ?Permission to leave phone message Yes  ?  ? ?Patient questions: ? ?Do you have a fever, pain , or abdominal swelling? No. ?Pain Score  0 * ? ?Have you tolerated food without any problems? Yes.   ? ?Have you been able to return to your normal activities? Yes.   ? ?Do you have any questions about your discharge instructions: ?Diet   No. ?Medications  No. ?Follow up visit  No. ? ?Do you have questions or concerns about your Care? No. ? ?Actions: ?* If pain score is 4 or above: ?No action needed, pain <4. ? ? ?

## 2021-06-29 NOTE — Progress Notes (Signed)
HPI: Follow-up atrial fibrillation. Patient seen in the emergency room January 2016 with palpitations. She had an electrocardiogram that demonstrated fibrillation and converted spontaneously to sinus rhythm. She had an event monitor 1/16 that did not show evidence of atrial fibrillation. Her echocardiogram in February 2016 showed normal LV function, mild left ventricular hypertrophy, atrial septum aneurysmal. Exercise treadmill April 2017 with no ST changes. Apixaban recommended previously but pt declined. Since last seen, she denies dyspnea, chest pain, palpitations or syncope.  Current Outpatient Medications  Medication Sig Dispense Refill   ALPRAZolam (XANAX) 0.25 MG tablet Take 0.175 mg by mouth as needed.     calcium carbonate (TUMS - DOSED IN MG ELEMENTAL CALCIUM) 500 MG chewable tablet 1 tablet     denosumab (PROLIA) 60 MG/ML SOLN injection Inject 60 mg into the skin every 6 (six) months. Administer in upper arm, thigh, or abdomen     losartan-hydrochlorothiazide (HYZAAR) 100-12.5 MG tablet Take 1 tablet by mouth daily.     naproxen sodium (ALEVE) 220 MG tablet Take 220 mg by mouth daily as needed.     SYNTHROID 75 MCG tablet Take 1 tablet (75 mcg total) by mouth daily before breakfast. 90 tablet 3   vedolizumab (ENTYVIO) 300 MG injection Inject into the vein every 30 (thirty) days.     verapamil (VERELAN PM) 180 MG 24 hr capsule Take 360 mg by mouth.     No current facility-administered medications for this visit.     Past Medical History:  Diagnosis Date   Anemia    Anxiety    Arthritis    Asthma    seasonal   Atypical lobular hyperplasia ([facility]) of left breast 07/2015   Cancer ([place])    skin   Cataract    FORMING   Crohn's disease ([place])    severe colonic stricturing   GERD (gastroesophageal reflux disease)    PAST HX   Hypertension    Hypothyroidism    IUD (intrauterine device) in place 1983   Safety coil per Dr. Valeta Harms note attempted to retrieve but unable  to do so   Osteoporosis 12/2016   T score -2.7 stable from prior DEXA on Prolia   Thyroid disease    Hypothyroid    Past Surgical History:  Procedure Laterality Date   Basal and squamous cell Ca excised     BREAST BIOPSY Left 2018   BREAST EXCISIONAL BIOPSY Left 2018   COLONOSCOPY N/A 07/01/2013   Procedure: COLONOSCOPY;  Surgeon: Garlan Fair, MD;  Location: [facility];  Service: Endoscopy;  Laterality: N/A;   COLONOSCOPY     Fissure surg     RADIOACTIVE SEED GUIDED EXCISIONAL BREAST BIOPSY Left 09/25/2015   Procedure: RADIOACTIVE SEED GUIDED EXCISIONAL BREAST BIOPSY;  Surgeon: Rolm Bookbinder, MD;  Location: [place];  Service: General;  Laterality: Left;   TRIGGER FINGER RELEASE Right 02/01/2016   Procedure: RELEASE TRIGGER FINGER/A-1 PULLEY, right thumb;  Surgeon: Daryll Brod, MD;  Location: [place];  Service: Orthopedics;  Laterality: Right;  FAB    Social History   Socioeconomic History   Marital status: Married    Spouse name: Not on file   Number of children: Not on file   Years of education: Not on file   Highest education level: Not on file  Occupational History   Not on file  Tobacco Use   Smoking status: Never   Smokeless tobacco: Never  Vaping Use   Vaping Use: Never  used  Substance and Sexual Activity   Alcohol use: Yes    Comment: Occasional   Drug use: No   Sexual activity: Not Currently    Birth control/protection: Post-menopausal    Comment: Pt. declined sexual hx questions  Other Topics Concern   Not on file  Social History Narrative   Not on file   Social Determinants of Health   Financial Resource Strain: Not on file  Food Insecurity: Not on file  Transportation Needs: Not on file  Physical Activity: Not on file  Stress: Not on file  Social Connections: Not on file  Intimate Partner Violence: Not on file    Family History  Problem Relation Age of Onset   Colon cancer Mother        DX'D AGE 23'S    Heart disease Mother        Atrial fibrillation   Crohn's disease Mother    Heart disease Maternal Grandmother    Hypertension Maternal Grandmother    Breast cancer Paternal Grandmother        age unknown   Esophageal cancer Neg Hx    Liver cancer Neg Hx    Pancreatic cancer Neg Hx    Rectal cancer Neg Hx    Stomach cancer Neg Hx    Colon polyps Neg Hx     ROS: no fevers or chills, productive cough, hemoptysis, dysphasia, odynophagia, melena, hematochezia, dysuria, hematuria, rash, seizure activity, orthopnea, PND, pedal edema, claudication. Remaining systems are negative.  Physical Exam: Well-developed well-nourished in no acute distress.  Skin is warm and dry.  HEENT is normal.  Neck is supple.  Chest is clear to auscultation with normal expansion.  Cardiovascular exam is regular rate and rhythm.  Abdominal exam nontender or distended. No masses palpated. Extremities show no edema. neuro grossly intact  ECG-normal sinus rhythm at a rate of 79, no ST changes.  Personally reviewed  A/P  1 Paroxysmal atrial fibrillation-patient denies any palpitations or recurrent episodes of atrial fibrillation.  Continue verapamil.  She continues to decline anticoagulation given history of Crohn's and risk of GI bleeding.  She also understands the risk of an embolic event.  2 hypertension-patient's blood pressure is controlled.  Continue present medications.  Kirk Ruths, MD

## 2021-07-01 ENCOUNTER — Encounter: Payer: Self-pay | Admitting: Gastroenterology

## 2021-07-03 ENCOUNTER — Ambulatory Visit (INDEPENDENT_AMBULATORY_CARE_PROVIDER_SITE_OTHER): Payer: No Typology Code available for payment source | Admitting: Gastroenterology

## 2021-07-03 ENCOUNTER — Encounter: Payer: Self-pay | Admitting: Gastroenterology

## 2021-07-03 VITALS — BP 138/76 | HR 91 | Ht 65.0 in | Wt 167.2 lb

## 2021-07-03 DIAGNOSIS — K50119 Crohn's disease of large intestine with unspecified complications: Secondary | ICD-10-CM | POA: Diagnosis not present

## 2021-07-03 NOTE — Patient Instructions (Signed)
If you are age 66 or older, your body mass index should be between 23-30. Your Body mass index is 27.82 kg/m?Marland Kitchen If this is out of the aforementioned range listed, please consider follow up with your Primary Care Provider. ? ?If you are age 22 or younger, your body mass index should be between 19-25. Your Body mass index is 27.82 kg/m?Marland Kitchen If this is out of the aformentioned range listed, please consider follow up with your Primary Care Provider.  ? ?________________________________________________________ ? ?The [facility] providers would like to encourage you to use [facility] to communicate with providers for non-urgent requests or questions.  Due to long hold times on the telephone, sending your provider a message by [facility] may be a faster and more efficient way to get a response.  Please allow 48 business hours for a response.  Please remember that this is for non-urgent requests.  ?_______________________________________________________ ? ?Continue Entyvio. ? ?Please follow up in 6 months. ? ?Thank you for entrusting me with your care and for choosing [employer], ?Dr. [place] Cellar ? ? ?

## 2021-07-03 NOTE — Progress Notes (Signed)
? ?HPI :  ?Crohn's History ?Ileocolonic Crohn's diagnosed > 20 years ago. History of colonic strictures. She has had longstanding untreated diseases for years, historically managed with steroids PRN. Previously followed by Dr. Earle Gell. Mother had Crohn's. She has never been on any maintenance medications for her disease until July 2019 when she started on Entyvio. History of skin cancers, osteoporosis.   ?  ?SINCE LAST VISIT ?  ?66 year old female here for a follow-up visit for her Crohn's disease.  Have not seen her in the office myself since 2019 although she seen Amy Esterwood last year, and saw me for a colonoscopy recently. ? ?Her colonoscopy was performed on April 27.  She had a few areas of stricturing of her colon that were similar in appearance to her last exam.  All able to be traversed, mild inflammatory changes noted only at the stricture, not severe.  There is no evidence of any inflammation elsewhere in her colon or small intestine.  Biopsies of her colon showed no dysplastic change or concerning findings. ? ?Generally she has been really happy with the [facility] since she has been on the regimen.  She maintains an every 4-week dosing regimen given she had a level of 9 without antibody formation in December 2019.  She tolerates the regimen well, she denies any side effects from it.  She states her bowel function continue with her diet but generally she is pretty happy with how she feels.  No blood in her stools.  She has gained some weight since she has been on Entyvio, having hard time losing weight.  She is not interested in changing the regimen as she is feeling well on Entyvio and she is concerned about risks of other regimens namely risk of infection and risk of malignancy.  She is on Prolia for history of osteoporosis, last bone density showed a T score of -2.1. ? ?She has had labs with Dr. Erling Conte for her [facility] in recent months.  Her labs arrived after she left the office today, as  outlined below ? ?  ?Prior colonoscopies: ?05/01/17 - normal EGD. Colonoscopy with severe Crohn's colitis and associated stricturing - unable to intubate right colon due to stricturing. ?10/01/2010 - active Crohn's colitis ?2007 - normal ileum, active colitis ?1994 - colitis and ileitis ?  ?CT enterography - 05/09/2017 - long segment bowel wall thickening descending colon with stenosis in tranverse colon, no small bowel inflammatoin ? ? ?Colonoscopy 06/21/21:The examined portion of the ileum was normal. ?- Stricture in the sigmoid colon, in the transverse colon and in the ascending colon. Biopsied. ?Stable in appearance compared to 2019 - mild inflammatory changes at the strictures but no ?inflammation elsewhere. ?- Anal papilla(e) were hypertrophied. Biopsied. ?- Internal hemorrhoids. ?- The examination was otherwise normal. ?- Biopsies for surveillance were taken from the right colon, left colon and transverse colon. ?Overall stable in appearance and no worsening since 8828 - certainly improved pre-biologic ?therapy but not in endoscopic remission. Patient has been hesitant to use therapies other than ?Entyvio in the past, can follow up in the office and discuss long term options. ? ?1. Surgical [P], ascending colon stricture ?REACTIVE COLONIC MUCOSA (SEE MICROSCOPIC COMMENT) ?NEGATIVE FOR DYSPLASIA AND CARCINOMA ?2. Surgical [P], random right colon sites ?BENIGN COLONIC MUCOSA WITH NO DIAGNOSTIC ABNORMALITY ?3. Surgical [P], transverse colon stricture ?HYPERPLASTIC COLONIC MUCOSA WITH ALTERED CRYPT ARCHITECTURE AND FOCAL ACUTE CRYPTITIS (SEE ?MICROSCOPIC COMMENT) ?NEGATIVE FOR DYSPLASIA AND GRANULOMAS ?4. Surgical [P], transverse colon ?BENIGN COLONIC MUCOSA WITH  NO DIAGNOSTIC ABNORMALITY ?5. Surgical [P], random left colon sites ?BENIGN COLONIC MUCOSA WITH NO DIAGNOSTIC ABNORMALITY ?6. Surgical [P], sigmoid colon stricture ?CHRONIC, MINIMALLY ACTIVE COLITIS WITH FOCAL ACUTE CRYPTITIS (SEE MICROSCOPIC  COMMENT) ?NEGATIVE FOR DYSPLASIA AND GRANULOMAS ?7. Surgical [P], colon, anal papilla ?BENIGN SQUAMOUS EPITHELIUM ?NEGATIVE FOR DYSPLASIA AND DIAGNOSTIC VIRAL CHANGE ? ? ?Past Medical History:  ?Diagnosis Date  ? Anemia   ? Anxiety   ? Arthritis   ? Asthma   ? seasonal  ? Atypical lobular hyperplasia [facility]) of left breast 07/2015  ? Cancer [facility])   ? skin  ? Cataract   ? FORMING  ? Crohn's disease ([place])   ? severe colonic stricturing  ? GERD (gastroesophageal reflux disease)   ? PAST HX  ? Hypertension   ? Hypothyroidism   ? IUD (intrauterine device) in place 1983  ? Safety coil per Dr. Valeta Harms note attempted to retrieve but unable to do so  ? Osteoporosis 12/2016  ? T score -2.7 stable from prior DEXA on Prolia  ? Thyroid disease   ? Hypothyroid  ? ? ? ?Past Surgical History:  ?Procedure Laterality Date  ? Basal and squamous cell Ca excised    ? BREAST BIOPSY Left 2018  ? BREAST EXCISIONAL BIOPSY Left 2018  ? COLONOSCOPY N/A 07/01/2013  ? Procedure: COLONOSCOPY;  Surgeon: Garlan Fair, MD;  Location: [facility];  Service: Endoscopy;  Laterality: N/A;  ? COLONOSCOPY    ? Fissure surg    ? RADIOACTIVE SEED GUIDED EXCISIONAL BREAST BIOPSY Left 09/25/2015  ? Procedure: RADIOACTIVE SEED GUIDED EXCISIONAL BREAST BIOPSY;  Surgeon: Rolm Bookbinder, MD;  Location: [facility];  Service: General;  Laterality: Left;  ? TRIGGER FINGER RELEASE Right 02/01/2016  ? Procedure: RELEASE TRIGGER FINGER/A-1 PULLEY, right thumb;  Surgeon: Daryll Brod, MD;  Location: [facility];  Service: Orthopedics;  Laterality: Right;  FAB  ? ?Family History  ?Problem Relation Age of Onset  ? Colon cancer Mother   ?     DX'D AGE 37'S  ? Heart disease Mother   ?     Atrial fibrillation  ? Crohn's disease Mother   ? Heart disease Maternal Grandmother   ? Hypertension Maternal Grandmother   ? Breast cancer Paternal Grandmother   ?     age unknown  ? Esophageal cancer Neg Hx   ? Liver cancer Neg Hx   ? Pancreatic cancer  Neg Hx   ? Rectal cancer Neg Hx   ? Stomach cancer Neg Hx   ? Colon polyps Neg Hx   ? ?Social History  ? ?Tobacco Use  ? Smoking status: Never  ? Smokeless tobacco: Never  ?Vaping Use  ? Vaping Use: Never used  ?Substance Use Topics  ? Alcohol use: Yes  ?  Comment: Occasional  ? Drug use: No  ? ?Current Outpatient Medications  ?Medication Sig Dispense Refill  ? ALPRAZolam (XANAX) 0.25 MG tablet Take 0.175 mg by mouth as needed.    ? calcium carbonate (TUMS - DOSED IN MG ELEMENTAL CALCIUM) 500 MG chewable tablet 1 tablet    ? denosumab (PROLIA) 60 MG/ML SOLN injection Inject 60 mg into the skin every 6 (six) months. Administer in upper arm, thigh, or abdomen    ? losartan-hydrochlorothiazide (HYZAAR) 100-12.5 MG tablet Take 1 tablet by mouth daily.    ? naproxen sodium (ALEVE) 220 MG tablet Take 220 mg by mouth daily as needed.    ? SYNTHROID 75 MCG tablet Take  1 tablet (75 mcg total) by mouth daily before breakfast. 90 tablet 3  ? vedolizumab (ENTYVIO) 300 MG injection Inject into the vein every 30 (thirty) days.    ? verapamil (VERELAN PM) 180 MG 24 hr capsule Take 360 mg by mouth.    ? ?No current facility-administered medications for this visit.  ? ?Allergies  ?Allergen Reactions  ? Codeine Nausea Only  ?  Needs something for nausea to go with pain pill  ? Tetracyclines & Related Diarrhea  ? Biaxin [Clarithromycin] Palpitations  ? Clarithromycin Palpitations  ? ? ? ?Review of Systems: ?All systems reviewed and negative except where noted in HPI.  ? ? ?Lab Results  ?Component Value Date  ? WBC 9.2 08/21/2020  ? HGB 12.7 08/21/2020  ? HCT 39.3 08/21/2020  ? MCV 90.6 08/21/2020  ? PLT 434 (H) 08/21/2020  ? ? ?Lab Results  ?Component Value Date  ? CREATININE 0.84 08/21/2020  ? BUN 14 08/21/2020  ? NA 141 08/21/2020  ? K 5.0 08/21/2020  ? CL 106 08/21/2020  ? CO2 25 08/21/2020  ? ? ?Lab Results  ?Component Value Date  ? ALT 16 08/21/2020  ? AST 25 08/21/2020  ? ALKPHOS 55 08/21/2020  ? BILITOT 0.5 08/21/2020  ? ?   ?Recent lab's from Dr. Creig Hines office: ? ?04/06/20: ?- CRP 0.35 ?- ESR 12 ?- Hgb 12.1, MCV 89.5, plt 407 ?- ALT 26, AST 28, T bil 0.4, AP 63 ?- BUN 12, Cr 0.79 ? ? ? ?Physical Exam: ?BP 138/76   Pulse 91   Ht 5' 5"  (1.651 m)   W

## 2021-07-13 ENCOUNTER — Encounter: Payer: Self-pay | Admitting: Cardiology

## 2021-07-13 ENCOUNTER — Ambulatory Visit: Payer: No Typology Code available for payment source | Admitting: Cardiology

## 2021-07-13 VITALS — BP 138/82 | HR 79 | Ht 65.5 in | Wt 167.0 lb

## 2021-07-13 DIAGNOSIS — I480 Paroxysmal atrial fibrillation: Secondary | ICD-10-CM | POA: Diagnosis not present

## 2021-07-13 DIAGNOSIS — I10 Essential (primary) hypertension: Secondary | ICD-10-CM | POA: Diagnosis not present

## 2021-07-13 NOTE — Patient Instructions (Signed)
  Follow-Up: At [facility], you and your health needs are our priority.  As part of our continuing mission to provide you with exceptional heart care, we have created designated Provider Care Teams.  These Care Teams include your primary Cardiologist (physician) and Advanced Practice Providers (APPs -  Physician Assistants and Nurse Practitioners) who all work together to provide you with the care you need, when you need it.  We recommend signing up for the patient portal called "MyChart".  Sign up information is provided on this After Visit Summary.  MyChart is used to connect with patients for Virtual Visits (Telemedicine).  Patients are able to view lab/test results, encounter notes, upcoming appointments, etc.  Non-urgent messages can be sent to your provider as well.   To learn more about what you can do with MyChart, go to [email or web address].    Your next appointment:   12 month(s)  The format for your next appointment:   In Person  Provider:   Kirk Ruths MD      Important Information About Sugar

## 2021-07-16 NOTE — Addendum Note (Signed)
Addended by: Zebedee Iba on: 07/16/2021 02:14 PM   Modules accepted: Orders

## 2021-07-25 ENCOUNTER — Other Ambulatory Visit: Payer: Self-pay | Admitting: Internal Medicine

## 2021-07-25 DIAGNOSIS — Z1231 Encounter for screening mammogram for malignant neoplasm of breast: Secondary | ICD-10-CM

## 2021-07-27 ENCOUNTER — Telehealth: Payer: Self-pay

## 2021-07-27 ENCOUNTER — Telehealth: Payer: Self-pay | Admitting: *Deleted

## 2021-07-27 NOTE — Telephone Encounter (Signed)
Received a call from patient. She reports that she received a call from [facility] regarding lab work that she had drawn about 1-2 weeks ago. Pt reports that she was told that she may have Hep B and they faxed over the labs to Dr. Havery Moros for his review. Pt was advised to contact our office to discuss further. Pt is concerned because she received Entyvio for her Crohn's disease and is scared that she may have Hep B. I told pt that I would check to see if we have received the lab results and we will have Dr. Havery Moros review and provide his recommendations.   Dr. Havery Moros reviewed patient's labs and stated that she has had a prior exposure to Hep B and has immunity, but does not actively have Hep B. Dr. Havery Moros advised that patient can continue her Entyvio infusions. I called patient back and informed her of Dr. Doyne Keel recommendations. Pt was thankful for the information provided. Pt also wanted me to thank Dr. Havery Moros as well. Pt had no concerns at the end of the call.

## 2021-07-27 NOTE — Telephone Encounter (Signed)
Thanks [employer].  She has a positive hep B core antibody, but has negative hep B surface antigen, also has a positive hep B surface antibody.  Her positive hep B core antibody is not new and has been present in the past.  She does not have chronic hep B, has had prior exposure, and appears immune.  Okay to continue Entyvio.  Thanks

## 2021-07-27 NOTE — Telephone Encounter (Signed)
Prolia insurance verification has been sent awaiting Summary of benefits New insurance sent [employer] to [employer].

## 2021-08-02 ENCOUNTER — Other Ambulatory Visit: Payer: Self-pay | Admitting: Cardiology

## 2021-08-09 ENCOUNTER — Telehealth: Payer: Self-pay

## 2021-08-09 NOTE — Telephone Encounter (Signed)
I called back April at [facility]  They are requesting a Crohn's disease activity index score for the insurance company to process Entyvio.  I have never had to provide that information before regarding approval from insurance company, she has been on this drug for years and it works well for her.  I instructed them how to calculate that score, they can look online for score calculated but has to be done with an interview with the patient to get information on her clinical wellbeing at this time.  I do not think it should be used for the purpose of approving a drug she has been on for years that works well for her.  She will reach back out to [employer] and answer their questions.

## 2021-08-09 NOTE — Telephone Encounter (Signed)
Robin [name] is requesting the pts "CDAI score" before they will approve her infusion. Hoping you may be able to help with this. Please advise. Call back number for Robin is [phone].

## 2021-08-13 NOTE — Telephone Encounter (Addendum)
Deductible  $2500($2500 met)  OOP MAX $6000 [phone] met)  Annual exam 02/22/2021  Calcium 9.2            Date 08/21/2020  Upcoming dental procedures   Prior Authorization needed Yes approved 08/20/2021-08/21/2022 [phone]  [phone] Pt estimated Cost $80   Appt 08/22/2021     Coverage Details: 0% ONE DOSE, %50 ADMIN FEE

## 2021-08-22 ENCOUNTER — Ambulatory Visit (INDEPENDENT_AMBULATORY_CARE_PROVIDER_SITE_OTHER): Payer: No Typology Code available for payment source | Admitting: Gynecology

## 2021-08-22 DIAGNOSIS — M810 Age-related osteoporosis without current pathological fracture: Secondary | ICD-10-CM

## 2021-08-22 MED ORDER — DENOSUMAB 60 MG/ML ~~LOC~~ SOSY: 60.0000 mg | PREFILLED_SYRINGE | Freq: Once | SUBCUTANEOUS | 0 refills | Status: DC

## 2021-08-22 MED ORDER — DENOSUMAB 60 MG/ML ~~LOC~~ SOSY
60.0000 mg | PREFILLED_SYRINGE | Freq: Once | SUBCUTANEOUS | Status: AC
  Administered 2021-08-22: 60 mg via SUBCUTANEOUS

## 2021-08-23 NOTE — Telephone Encounter (Signed)
Medication ordered from, [facility], Prior auth scanned in system

## 2021-09-04 ENCOUNTER — Ambulatory Visit
Admission: RE | Admit: 2021-09-04 | Discharge: 2021-09-04 | Disposition: A | Discharge status: Home / Self Care | Payer: No Typology Code available for payment source | Source: Ambulatory Visit | Attending: Internal Medicine | Admitting: Internal Medicine

## 2021-09-04 DIAGNOSIS — Z1231 Encounter for screening mammogram for malignant neoplasm of breast: Secondary | ICD-10-CM

## 2021-11-05 ENCOUNTER — Other Ambulatory Visit: Payer: Self-pay | Admitting: Cardiology

## 2022-02-11 ENCOUNTER — Other Ambulatory Visit: Payer: Self-pay | Admitting: Obstetrics & Gynecology

## 2022-02-11 NOTE — Telephone Encounter (Signed)
Med refill request: Prolia 27m/ml   Last AEX: 02/22/21 /TW Next AEX: 02/27/22 /TW  08/21/20 Calcium 9.2  Last prolia received 08/22/21  Dexa: 04/24/21   Last MMG (if hormonal med)  Refill authorized: Please Advise?

## 2022-02-27 ENCOUNTER — Ambulatory Visit (INDEPENDENT_AMBULATORY_CARE_PROVIDER_SITE_OTHER): Payer: No Typology Code available for payment source | Admitting: Nurse Practitioner

## 2022-02-27 ENCOUNTER — Encounter: Payer: Self-pay | Admitting: Nurse Practitioner

## 2022-02-27 ENCOUNTER — Other Ambulatory Visit: Payer: Self-pay | Admitting: *Deleted

## 2022-02-27 ENCOUNTER — Telehealth: Payer: Self-pay | Admitting: *Deleted

## 2022-02-27 VITALS — BP 118/74 | HR 77 | Ht 64.57 in | Wt 172.0 lb

## 2022-02-27 DIAGNOSIS — Z01419 Encounter for gynecological examination (general) (routine) without abnormal findings: Secondary | ICD-10-CM

## 2022-02-27 DIAGNOSIS — M810 Age-related osteoporosis without current pathological fracture: Secondary | ICD-10-CM | POA: Diagnosis not present

## 2022-02-27 DIAGNOSIS — Z780 Asymptomatic menopausal state: Secondary | ICD-10-CM | POA: Diagnosis not present

## 2022-02-27 LAB — CALCIUM: Calcium: 9.7 mg/dL (ref 8.6–10.4)

## 2022-02-27 MED ORDER — DENOSUMAB 60 MG/ML ~~LOC~~ SOSY: 60.0000 mg | PREFILLED_SYRINGE | Freq: Once | SUBCUTANEOUS | 0 refills | Status: AC

## 2022-02-27 NOTE — Progress Notes (Signed)
   Robin [name] Aug 26, 1955 [id number]   History:  67 y.o. G0 presents for annual exam. Postmenopausal - no HRT, no bleeding. Normal pap history. History of atypical ductal hyperplasia in 2017, actively follows with Dr. Jana Hakim. Osteoporosis - on Prolia. History of HTN, hypothyroidism, Crohn's, basal and squamous cell skin cancer - sees derm every 3 months. Lost IUD in 1983, unable to retrieve and left in place.   Gynecologic History No LMP recorded. Patient is postmenopausal.   Contraception/Family planning: post menopausal status Sexually active: No  Health Maintenance Last Pap: 02/22/2021. Results were: Normal Last mammogram: 09/04/2021. Results were: Normal Last colonoscopy: 06/21/2021. Results were: 2-year recall Last Dexa: 04/24/2021. Results were: T-score -2.1  Past medical history, past surgical history, family history and social history were all reviewed and documented in the EPIC chart. Married. Works for [employer] ([employer]   ROS:  A ROS was performed and pertinent positives and negatives are included.  Exam:  Vitals:   02/27/22 0933  BP: 118/74  Pulse: 77  SpO2: 100%  Weight: 172 lb (78 kg)  Height: 5' 4.57" (1.64 m)    Body mass index is 29.01 kg/m.  General appearance:  Normal Thyroid:  Symmetrical, normal in size, without palpable masses or nodularity. Respiratory  Auscultation:  Clear without wheezing or rhonchi Cardiovascular  Auscultation:  Regular rate, without rubs, murmurs or gallops  Edema/varicosities:  Not grossly evident Abdominal  Soft,nontender, without masses, guarding or rebound.  Liver/spleen:  No organomegaly noted  Hernia:  None appreciated  Skin  Inspection:  Grossly normal. Many nevi (sees derm) Breasts: Examined lying and sitting.   Right: Without masses, retractions, nipple discharge or axillary adenopathy.   Left: Without masses, retractions, nipple discharge or axillary adenopathy. Genitourinary   Inguinal/mons:  Normal  without inguinal adenopathy  External genitalia:  Normal appearing vulva with no masses, tenderness, or lesions  BUS/Urethra/Skene's glands:  Normal  Vagina:  Normal appearing with normal color and discharge, no lesions. Atrophic changes  Cervix:  Normal appearing without discharge or lesions  Uterus:  Normal in size, shape and contour.  Midline and mobile, nontender  Adnexa/parametria:     Rt: Normal in size, without masses or tenderness.   Lt: Normal in size, without masses or tenderness.  Anus and perineum: Normal  Digital rectal exam: Deferred  Patient informed chaperone available to be present for breast and pelvic exam. Patient has requested no chaperone to be present. Patient has been advised what will be completed during breast and pelvic exam.   Assessment/Plan:  67 y.o. G0 for annual exam.   Well female exam with routine gynecological exam - Education provided on SBEs, importance of preventative screenings, current guidelines, high calcium diet, regular exercise, and multivitamin daily.  Labs with PCP.   Postmenopausal - No HRT, no bleeding.  Age-related osteoporosis without current pathological fracture - Plan: Calcium. T-score -2.1 (February 2023). On Prolia. Will check calcium today.   Screening for cervical cancer - Normal Pap history. No longer screening per guidelines.   Screening for breast cancer - 2017 atypical ductal hyperplasia, actively follows with Dr. Jana Hakim. Continue annual screenings.  Normal breast exam today.  Screening for colon cancer - 05/2021 colonoscopy. Annual colonoscopies recommended d/t Crohn's.   Return in 1 year for annual.     Tamela Gammon DNP, 10:14 AM 02/27/2022

## 2022-02-27 NOTE — Telephone Encounter (Deleted)
Prolia insurance verification has been sent awaiting Summary of benefits  

## 2022-02-27 NOTE — Telephone Encounter (Signed)
Prolia insurance verification has been sent awaiting Summary of benefits  Pt gets meds through pharmacy. Medication refill sent

## 2022-02-28 NOTE — Telephone Encounter (Signed)
Prolia will be delivered 03/07/2022

## 2022-03-04 ENCOUNTER — Encounter: Payer: Self-pay | Admitting: Gastroenterology

## 2022-03-11 ENCOUNTER — Ambulatory Visit (INDEPENDENT_AMBULATORY_CARE_PROVIDER_SITE_OTHER): Payer: No Typology Code available for payment source | Admitting: *Deleted

## 2022-03-11 DIAGNOSIS — M810 Age-related osteoporosis without current pathological fracture: Secondary | ICD-10-CM | POA: Diagnosis not present

## 2022-03-11 MED ORDER — DENOSUMAB 60 MG/ML ~~LOC~~ SOSY
60.0000 mg | PREFILLED_SYRINGE | Freq: Once | SUBCUTANEOUS | Status: AC
  Administered 2022-03-11: 60 mg via SUBCUTANEOUS

## 2022-03-29 NOTE — Telephone Encounter (Signed)
Pt gets meds through pharmacy.   Annual exam 02/27/2022  Calcium  9.7           Date 02/27/2022  Upcoming dental procedures no  Is Prior Authorization needed no  Appt 03/11/2022

## 2022-05-07 ENCOUNTER — Encounter: Payer: Self-pay | Admitting: Gastroenterology

## 2022-05-07 ENCOUNTER — Ambulatory Visit (INDEPENDENT_AMBULATORY_CARE_PROVIDER_SITE_OTHER): Payer: Medicare Other | Admitting: Gastroenterology

## 2022-05-07 VITALS — BP 148/82 | HR 100 | Ht 65.0 in | Wt 175.4 lb

## 2022-05-07 DIAGNOSIS — M8580 Other specified disorders of bone density and structure, unspecified site: Secondary | ICD-10-CM | POA: Diagnosis not present

## 2022-05-07 DIAGNOSIS — K50119 Crohn's disease of large intestine with unspecified complications: Secondary | ICD-10-CM

## 2022-05-07 NOTE — Progress Notes (Signed)
HPI :  Crohn's History Ileocolonic Crohn's diagnosed > 20 years ago. History of colonic strictures. She has had longstanding untreated diseases for years, historically managed with steroids PRN. Previously followed by Dr. Earle Gell. Mother had Crohn's. She has never been on any maintenance medications for her disease until July 2019 when she started on Entyvio. History of skin cancers, osteoporosis.     SINCE LAST VISIT   67 year old female here for follow-up visit for Crohn's disease.  I last saw her in May of last year.  She has been maintained on Entyvio every 4 weeks since have last seen her.  Recall she has been on this regimen since she had a subtherapeutic level of 9 without antibody formation in December 2019.  She continues to tolerate the regimen well and is really happy with how she is done.  She generally has no baseline symptoms that bother her too much.  Her diarrhea is generally controlled, she sleeps through the night without any incontinence, that was a major issue for her pretreatment.  She generally has a few bowel movements per day.  She has been gained a bit of weight since have last seen her.  No flares.  No abdominal pains.  She has a history of osteoporosis on Prolia, last DEXA scan within the past year showed osteopenia.  She is using Tums as needed for her calcium supplement.  I do not see recent vitamin D levels on file for her.  She has been having her labs done by Dr. Dossie Der at the infusion center.  She does not use any NSAIDs routinely.  Her last colonoscopy was performed April 2023.  She had a few areas of stricturing of her colon that were similar in appearance to her last exam.  All able to be traversed, mild inflammatory changes noted only at the stricture, not severe.  There is no evidence of any inflammation elsewhere in her colon or small intestine.  Biopsies of her colon showed no dysplastic change or concerning findings.    Prior colonoscopies: 05/01/17 -  normal EGD. Colonoscopy with severe Crohn's colitis and associated stricturing - unable to intubate right colon due to stricturing. 10/01/2010 - active Crohn's colitis 2007 - normal ileum, active colitis 1994 - colitis and ileitis   CT enterography - 05/09/2017 - long segment bowel wall thickening descending colon with stenosis in tranverse colon, no small bowel inflammatoin   Colonoscopy 06/21/21:The examined portion of the ileum was normal. - Stricture in the sigmoid colon, in the transverse colon and in the ascending colon. Biopsied. Stable in appearance compared to 2019 - mild inflammatory changes at the strictures but no inflammation elsewhere. - Anal papilla(e) were hypertrophied. Biopsied. - Internal hemorrhoids. - The examination was otherwise normal. - Biopsies for surveillance were taken from the right colon, left colon and transverse colon. Overall stable in appearance and no worsening since [id number] - certainly improved pre-biologic therapy but not in endoscopic remission. Patient has been hesitant to use therapies other than Entyvio in the past, can follow up in the office and discuss long term options.   1. Surgical [P], ascending colon stricture REACTIVE COLONIC MUCOSA (SEE MICROSCOPIC COMMENT) NEGATIVE FOR DYSPLASIA AND CARCINOMA 2. Surgical [P], random right colon sites BENIGN COLONIC MUCOSA WITH NO DIAGNOSTIC ABNORMALITY 3. Surgical [P], transverse colon stricture HYPERPLASTIC COLONIC MUCOSA WITH ALTERED CRYPT ARCHITECTURE AND FOCAL ACUTE CRYPTITIS (SEE MICROSCOPIC COMMENT) NEGATIVE FOR DYSPLASIA AND GRANULOMAS 4. Surgical [P], transverse colon BENIGN COLONIC MUCOSA WITH NO DIAGNOSTIC ABNORMALITY 5.  Surgical [P], random left colon sites BENIGN COLONIC MUCOSA WITH NO DIAGNOSTIC ABNORMALITY 6. Surgical [P], sigmoid colon stricture CHRONIC, MINIMALLY ACTIVE COLITIS WITH FOCAL ACUTE CRYPTITIS (SEE MICROSCOPIC COMMENT) NEGATIVE FOR DYSPLASIA AND GRANULOMAS 7. Surgical [P],  colon, anal papilla BENIGN SQUAMOUS EPITHELIUM NEGATIVE FOR DYSPLASIA AND DIAGNOSTIC VIRAL CHANGE   DEXA scan - 04/24/21 - t score (-) 2.1 - osteopenia - repeat in 2 years   Past Medical History:  Diagnosis Date   Anemia    Anxiety    Arthritis    Asthma    seasonal   Atypical lobular hyperplasia (ALH) of left breast 07/2015   Cancer ([place])    skin   Cataract    FORMING   Crohn's disease ([place])    severe colonic stricturing   GERD (gastroesophageal reflux disease)    PAST HX   Hypertension    Hypothyroidism    IUD (intrauterine device) in place 1983   Safety coil per Dr. Valeta Harms note attempted to retrieve but unable to do so   Osteoporosis 12/2016   T score -2.7 stable from prior DEXA on Prolia   PAF (paroxysmal atrial fibrillation) ([place])    Thyroid disease    Hypothyroid     Past Surgical History:  Procedure Laterality Date   Basal and squamous cell Ca excised     BREAST BIOPSY Left 2018   BREAST EXCISIONAL BIOPSY Left 2018   COLONOSCOPY N/A 07/01/2013   Procedure: COLONOSCOPY;  Surgeon: Garlan Fair, MD;  Location: [facility];  Service: Endoscopy;  Laterality: N/A;   COLONOSCOPY     Fissure surg     RADIOACTIVE SEED GUIDED EXCISIONAL BREAST BIOPSY Left 09/25/2015   Procedure: RADIOACTIVE SEED GUIDED EXCISIONAL BREAST BIOPSY;  Surgeon: Rolm Bookbinder, MD;  Location: [facility];  Service: General;  Laterality: Left;   TRIGGER FINGER RELEASE Right 02/01/2016   Procedure: RELEASE TRIGGER FINGER/A-1 PULLEY, right thumb;  Surgeon: Daryll Brod, MD;  Location: [facility];  Service: Orthopedics;  Laterality: Right;  FAB   Family History  Problem Relation Age of Onset   Colon cancer Mother        DX'D AGE 52'S   Heart disease Mother        Atrial fibrillation   Crohn's disease Mother    Heart disease Maternal Grandmother    Hypertension Maternal Grandmother    Breast cancer Paternal Grandmother        age unknown   Esophageal  cancer Neg Hx    Liver cancer Neg Hx    Pancreatic cancer Neg Hx    Rectal cancer Neg Hx    Stomach cancer Neg Hx    Colon polyps Neg Hx    Social History   Tobacco Use   Smoking status: Never   Smokeless tobacco: Never  Vaping Use   Vaping Use: Never used  Substance Use Topics   Alcohol use: Yes    Comment: Occasional   Drug use: No   Current Outpatient Medications  Medication Sig Dispense Refill   ALPRAZolam (XANAX) 0.25 MG tablet Take 0.175 mg by mouth as needed.     calcium carbonate (TUMS - DOSED IN MG ELEMENTAL CALCIUM) 500 MG chewable tablet 1 tablet     denosumab (PROLIA) 60 MG/ML SOLN injection Inject 60 mg into the skin every 6 (six) months. Administer in upper arm, thigh, or abdomen     losartan-hydrochlorothiazide (HYZAAR) 100-12.5 MG tablet Take 1 tablet by mouth daily.     naproxen  sodium (ALEVE) 220 MG tablet Take 220 mg by mouth daily as needed.     PROLIA 60 MG/ML SOSY injection TO BE ADMINISTERED IN PHYSICIAN'S OFFICE. INJECT ONE SYRINGE SUBCUTANEOUSLY ONCE EVERY 6 MONTHS. REFRIGERATE. USE WITHIN 14 DAYS ONCE AT ROOM TEMPERATURE. 1 mL 1   SYNTHROID 75 MCG tablet Take 1 tablet (75 mcg total) by mouth daily before breakfast. 90 tablet 3   vedolizumab (ENTYVIO) 300 MG injection Inject into the vein every 30 (thirty) days.     verapamil (VERELAN PM) 180 MG 24 hr capsule TAKE 2 CAPSULES BY MOUTH EVERY DAY(INS NOT COVER) 180 capsule 2   No current facility-administered medications for this visit.   Allergies  Allergen Reactions   Codeine Nausea Only    Needs something for nausea to go with pain pill   Tetracyclines & Related Diarrhea   Biaxin [Clarithromycin] Palpitations   Clarithromycin Palpitations     Review of Systems: All systems reviewed and negative except where noted in HPI.   Lab Results  Component Value Date   WBC 9.2 08/21/2020   HGB 12.7 08/21/2020   HCT 39.3 08/21/2020   MCV 90.6 08/21/2020   PLT 434 (H) 08/21/2020    Lab Results   Component Value Date   CREATININE 0.84 08/21/2020   BUN 14 08/21/2020   NA 141 08/21/2020   K 5.0 08/21/2020   CL 106 08/21/2020   CO2 25 08/21/2020    Lab Results  Component Value Date   ALT 16 08/21/2020   AST 25 08/21/2020   ALKPHOS 55 08/21/2020   BILITOT 0.5 08/21/2020     Physical Exam: BP (!) 148/82   Pulse 100   Ht '5\' 5"'$  (1.651 m)   Wt 175 lb 6.4 oz (79.6 kg)   BMI 29.19 kg/m  Constitutional: Pleasant,well-developed, female in no acute distress. Neurological: Alert and oriented to person place and time. Skin: Skin is warm and dry. No rashes noted. Psychiatric: Normal mood and affect. Behavior is normal.   ASSESSMENT: 67 y.o. female here for assessment of the following  1. Crohn's disease of colon with complication ([place])    Generally she is very happy with the regimen of Entyvio every 4 weeks.  She is tolerating it well, she has not had any flares of her colitis and continues to feel well.  She prefers Entyvio over other Biologics given risks of other therapies.  I need to get updated labs from Dr. Dossie Der who has been monitoring this, will reach out to their office and see if she has had a vitamin D level checked recently as well, if not she will need that done.  She is on Prolia and her osteoporosis seems improved.  While she is definitely better on Entyvio, she was not in complete mucosal remission on her last colonoscopy.  She does have history of stricturing that is chronic.  We discussed that with the chronicity of her Crohn's disease she is at higher than average risk for colon cancer and discussed when she wanted another colonoscopy.  I offered her it annually, she is really hoping to do this once every 2 years or so if she is otherwise stable.  She just paid off her last colonoscopy and is worried about finances, would prefer to do this in 2025 if possible.  Otherwise stable, she will see me every 6 to 12 months.  PLAN: - continue Entyvio every 4 weeks -  obtain most recent labs from Dr. Creig Hines office - add vitamin D  if needed -  - colonoscopy 2025 - offered now she declines and wants to space this out as much as possible - f/u q 6-12 months if not sooner with issues.   Jolly Mango, MD [facility]

## 2022-05-07 NOTE — Patient Instructions (Addendum)
We will request your labs from Dr. Audelia Hives office. You will need a Vitamin D level if this was not drawn with your latest labs.  You will be due for a recall colonoscopy in 05-2023. We will send you a reminder in the mail when it gets closer to that time.  Thank you for entrusting me with your care and for choosing [facility], Dr. Easton Cellar    If your blood pressure at your visit was 140/90 or greater, please contact your primary care physician to follow up on this.  _______________________________________________________  If you are age 40 or older, your body mass index should be between 23-30. Your Body mass index is 29.19 kg/m. If this is out of the aforementioned range listed, please consider follow up with your Primary Care Provider.  If you are age 46 or younger, your body mass index should be between 19-25. Your Body mass index is 29.19 kg/m. If this is out of the aformentioned range listed, please consider follow up with your Primary Care Provider.   ________________________________________________________  The [facility] providers would like to encourage you to use [facility] to communicate with providers for non-urgent requests or questions.  Due to long hold times on the telephone, sending your provider a message by [facility] may be a faster and more efficient way to get a response.  Please allow 48 business hours for a response.  Please remember that this is for non-urgent requests.  _______________________________________________________  Due to recent changes in healthcare laws, you may see the results of your imaging and laboratory studies on MyChart before your provider has had a chance to review them.  We understand that in some cases there may be results that are confusing or concerning to you. Not all laboratory results come back in the same time frame and the provider may be waiting for multiple results in order to interpret others.  Please give [place] 48 hours in  order for your provider to thoroughly review all the results before contacting the office for clarification of your results.

## 2022-05-08 ENCOUNTER — Telehealth: Payer: Self-pay

## 2022-05-08 NOTE — Telephone Encounter (Signed)
Called and spoke to nurse. Asked that Dr. Dossie Der draw a Vitamin D level at next infusion, next week

## 2022-05-09 ENCOUNTER — Telehealth: Payer: Self-pay | Admitting: Gastroenterology

## 2022-05-09 NOTE — Telephone Encounter (Signed)
Results of prior labs arrived for this patient:  04/19/22: CRP 0.31  Hgb 12.3, MCV 91, plt 409, WBC 7.1  Albumin 3.5, AST 27, ALT 27, AP 70, T bil 0.4  BUN 12, Cr 0.82  Hep B and C negative, QG negative  06/2021

## 2022-07-08 NOTE — Progress Notes (Signed)
HPI: Follow-up atrial fibrillation. Patient seen in the emergency room January 2016 with palpitations. She had an electrocardiogram that demonstrated fibrillation and converted spontaneously to sinus rhythm. She had an event monitor 1/16 that did not show evidence of atrial fibrillation. Her echocardiogram in February 2016 showed normal LV function, mild left ventricular hypertrophy, atrial septum aneurysmal. Exercise treadmill April 2017 with no ST changes. Apixaban recommended previously but pt declined. Since last seen, she denies dyspnea, chest pain, palpitations or syncope.  She has had no recurrent bouts of atrial fibrillation by her report.  Current Outpatient Medications  Medication Sig Dispense Refill   ALPRAZolam (XANAX) 0.25 MG tablet Take 0.175 mg by mouth as needed.     calcium carbonate (TUMS - DOSED IN MG ELEMENTAL CALCIUM) 500 MG chewable tablet 1 tablet     losartan-hydrochlorothiazide (HYZAAR) 100-12.5 MG tablet Take 1 tablet by mouth daily.     naproxen sodium (ALEVE) 220 MG tablet Take 220 mg by mouth daily as needed.     PROLIA 60 MG/ML SOSY injection TO BE ADMINISTERED IN PHYSICIAN'S OFFICE. INJECT ONE SYRINGE SUBCUTANEOUSLY ONCE EVERY 6 MONTHS. REFRIGERATE. USE WITHIN 14 DAYS ONCE AT ROOM TEMPERATURE. 1 mL 1   SYNTHROID 75 MCG tablet Take 1 tablet (75 mcg total) by mouth daily before breakfast. 90 tablet 3   vedolizumab (ENTYVIO) 300 MG injection Inject into the vein every 30 (thirty) days.     verapamil (VERELAN PM) 180 MG 24 hr capsule TAKE 2 CAPSULES BY MOUTH EVERY DAY(INS NOT COVER) 180 capsule 2   No current facility-administered medications for this visit.     Past Medical History:  Diagnosis Date   Anemia    Anxiety    Arthritis    Asthma    seasonal   Atypical lobular hyperplasia (ALH) of left breast 07/2015   Cancer ([facility])    skin   Cataract    FORMING   Crohn's disease ([facility])    severe colonic stricturing   GERD (gastroesophageal reflux disease)     PAST HX   Hypertension    Hypothyroidism    IUD (intrauterine device) in place 1983   Safety coil per Dr. Verl Dicker note attempted to retrieve but unable to do so   Osteoporosis 12/2016   T score -2.7 stable from prior DEXA on Prolia   PAF (paroxysmal atrial fibrillation) ([facility])    Thyroid disease    Hypothyroid    Past Surgical History:  Procedure Laterality Date   Basal and squamous cell Ca excised     BREAST BIOPSY Left 2018   BREAST EXCISIONAL BIOPSY Left 2018   COLONOSCOPY N/A 07/01/2013   Procedure: COLONOSCOPY;  Surgeon: Charolett Bumpers, MD;  Location: [facility];  Service: Endoscopy;  Laterality: N/A;   COLONOSCOPY     Fissure surg     RADIOACTIVE SEED GUIDED EXCISIONAL BREAST BIOPSY Left 09/25/2015   Procedure: RADIOACTIVE SEED GUIDED EXCISIONAL BREAST BIOPSY;  Surgeon: Emelia Loron, MD;  Location: [facility];  Service: General;  Laterality: Left;   TRIGGER FINGER RELEASE Right 02/01/2016   Procedure: RELEASE TRIGGER FINGER/A-1 PULLEY, right thumb;  Surgeon: Cindee Salt, MD;  Location: [facility];  Service: Orthopedics;  Laterality: Right;  FAB    Social History   Socioeconomic History   Marital status: Married    Spouse name: Not on file   Number of children: Not on file   Years of education: Not on file   Highest education level: Not  on file  Occupational History   Not on file  Tobacco Use   Smoking status: Never   Smokeless tobacco: Never  Vaping Use   Vaping Use: Never used  Substance and Sexual Activity   Alcohol use: Yes    Comment: Occasional   Drug use: No   Sexual activity: Not Currently    Birth control/protection: Post-menopausal    Comment: Pt. declined sexual hx questions  Other Topics Concern   Not on file  Social History Narrative   Not on file   Social Determinants of Health   Financial Resource Strain: Not on file  Food Insecurity: Not on file  Transportation Needs: Not on file  Physical  Activity: Not on file  Stress: Not on file  Social Connections: Not on file  Intimate Partner Violence: Not on file    Family History  Problem Relation Age of Onset   Colon cancer Mother        DX'D AGE 87'S   Heart disease Mother        Atrial fibrillation   Crohn's disease Mother    Heart disease Maternal Grandmother    Hypertension Maternal Grandmother    Breast cancer Paternal Grandmother        age unknown   Esophageal cancer Neg Hx    Liver cancer Neg Hx    Pancreatic cancer Neg Hx    Rectal cancer Neg Hx    Stomach cancer Neg Hx    Colon polyps Neg Hx     ROS: no fevers or chills, productive cough, hemoptysis, dysphasia, odynophagia, melena, hematochezia, dysuria, hematuria, rash, seizure activity, orthopnea, PND, pedal edema, claudication. Remaining systems are negative.  Physical Exam: Well-developed well-nourished in no acute distress.  Skin is warm and dry.  HEENT is normal.  Neck is supple.  Chest is clear to auscultation with normal expansion.  Cardiovascular exam is regular rate and rhythm.  Abdominal exam nontender or distended. No masses palpated. Extremities show no edema. neuro grossly intact  ECG-normal sinus rhythm at a rate of 77, no ST changes.  Personally reviewed  A/P  1 paroxysmal atrial fibrillation-no recurrences since last office visit.  Will continue verapamil.  She has declined anticoagulation in the past due to history of Crohn's/risk of GI bleed.  She also understands that there is a higher risk of embolic event.  Also the only documented episode occurred in 2016.  2 hypertension-blood pressure controlled.  Continue present medical regimen.  Olga Millers, MD

## 2022-07-16 ENCOUNTER — Encounter: Payer: Self-pay | Admitting: Cardiology

## 2022-07-16 ENCOUNTER — Ambulatory Visit: Payer: Medicare Other | Attending: Cardiology | Admitting: Cardiology

## 2022-07-16 VITALS — BP 124/72 | HR 77 | Ht 65.0 in | Wt 172.6 lb

## 2022-07-16 DIAGNOSIS — I480 Paroxysmal atrial fibrillation: Secondary | ICD-10-CM | POA: Diagnosis present

## 2022-07-16 DIAGNOSIS — I10 Essential (primary) hypertension: Secondary | ICD-10-CM | POA: Diagnosis present

## 2022-07-16 NOTE — Patient Instructions (Signed)
  Follow-Up: At [facility], you and your health needs are our priority.  As part of our continuing mission to provide you with exceptional heart care, we have created designated Provider Care Teams.  These Care Teams include your primary Cardiologist (physician) and Advanced Practice Providers (APPs -  Physician Assistants and Nurse Practitioners) who all work together to provide you with the care you need, when you need it.  We recommend signing up for the patient portal called "MyChart".  Sign up information is provided on this After Visit Summary.  MyChart is used to connect with patients for Virtual Visits (Telemedicine).  Patients are able to view lab/test results, encounter notes, upcoming appointments, etc.  Non-urgent messages can be sent to your provider as well.   To learn more about what you can do with MyChart, go to [email or web address].    Your next appointment:   12 month(s)  Provider:   Brian Crenshaw MD    

## 2022-07-17 ENCOUNTER — Telehealth: Payer: Self-pay

## 2022-07-17 NOTE — Telephone Encounter (Signed)
   Pre-operative Risk Assessment    Patient Name: Robin [name]  DOB: September 06, 1955 MRN: [id number]      Request for Surgical Clearance    Procedure:   Left knee scope medial minisectomy  Date of Surgery:  Clearance TBD                                 Surgeon:  Margarita Rana, M.D. Surgeon's Group or Practice Name:  [name] Phone number:  [phone] Fax number:  [phone]   Type of Clearance Requested:   - Medical    Type of Anesthesia:   CHOICE   Additional requests/questions:    [place], [place]   07/17/2022, 11:31 AM

## 2022-07-17 NOTE — Telephone Encounter (Signed)
   Name: Robin [name]  DOB: 06/25/1955  MRN: [id number]   Primary Cardiologist: Olga Millers, MD  Chart reviewed as part of pre-operative protocol coverage. Patient was contacted 07/17/2022 in reference to pre-operative risk assessment for pending surgery as outlined below.  Robin [name] was last seen on 07/16/2022 by Dr. Jens Som.  Since that day, Robin [name] has done well.  Denies any new symptoms or concerns.  She is able to complete greater than 4 METS without difficulty.  Therefore, based on ACC/AHA guidelines, the patient would be at acceptable risk for the planned procedure without further cardiovascular testing.   The patient was advised that if she develops new symptoms prior to surgery to contact our office to arrange for a follow-up visit, and she verbalized understanding.  I will route this recommendation to the requesting party via Epic fax function and remove from pre-op pool. Please call with questions.  Joylene Grapes, NP 07/17/2022, 11:40 AM

## 2022-07-19 ENCOUNTER — Telehealth: Payer: Self-pay | Admitting: Gastroenterology

## 2022-07-19 NOTE — Telephone Encounter (Signed)
Called and spoke with Tresa Endo regarding clearance. Tresa Endo informed me that she faxed over a clearance request form that will need to be completed and faxed back. Form was in Dr. Lanetta Inch office. I have completed the form and faxed to [facility] as requested. A copy of request will be scanned into patient's chart.

## 2022-07-19 NOTE — Telephone Encounter (Signed)
I'm not sure why should would need clearance from our end. She has well controlled Crohn's on Entyvio which will not affect her wound healing. Can proceed with surgery from my perspective, I don't have any concerns. Thanks

## 2022-07-19 NOTE — Telephone Encounter (Signed)
Patient called she needs knee surgery right away but needs clearance from Dr. Adela Lank to be able to schedule. A phone number to give clearance to is [phone] ext [phone] and speak to [place]. Please advise, thank you.

## 2022-07-29 ENCOUNTER — Other Ambulatory Visit: Payer: Self-pay | Admitting: Physician Assistant

## 2022-07-29 DIAGNOSIS — Z1231 Encounter for screening mammogram for malignant neoplasm of breast: Secondary | ICD-10-CM

## 2022-08-05 ENCOUNTER — Other Ambulatory Visit: Payer: Self-pay | Admitting: Cardiology

## 2022-08-12 ENCOUNTER — Other Ambulatory Visit: Payer: Self-pay | Admitting: Nurse Practitioner

## 2022-08-13 NOTE — Telephone Encounter (Signed)
Med refill request: Prolia Last AEX: 02/27/22 Next AEX: not scheduled Last MMG (if hormonal med) 09/04/21 Refill authorized: Please Advise?

## 2022-08-21 ENCOUNTER — Telehealth: Payer: Self-pay | Admitting: *Deleted

## 2022-08-21 NOTE — Telephone Encounter (Signed)
Next Prolia due after 09/09/22.   Submitted to [employer] for review of benefits.

## 2022-08-27 NOTE — Telephone Encounter (Signed)
Spoke w/ pt, she confirmed Medicare and BCBS cards on file from 04/2022 are still correct.  However, she also has an Rx Card from:  [employer] RxBIN [id number] RxPCN [id number] RxGroup [id number]  Issuer [id number]  ID# [id number]

## 2022-08-27 NOTE — Telephone Encounter (Signed)
Left message to call Noreene Larsson, RN at [place], [phone], option 5.   Next Prolia Due 09/10/22.   Called to confirm insurance on file.

## 2022-08-28 MED ORDER — DENOSUMAB 60 MG/ML ~~LOC~~ SOSY
60.0000 mg | PREFILLED_SYRINGE | Freq: Once | SUBCUTANEOUS | Status: AC
  Administered 2022-09-11: 60 mg via SUBCUTANEOUS

## 2022-08-28 NOTE — Telephone Encounter (Signed)
Benefits reviewed through [employer] for [employer] and [employer].  No coverage for [employer].   Medicare benefits provided.   Deductible:  $240 ($240 met).  20 % coinsurance once deductible met  OOP MAX: None  Annual exam:02/27/22 -TW  Calcium:    9.7        Date: 02/27/22  Upcoming dental procedures: No   Hx of Kidney Disease: No   Last Bone Density Scan: 04/24/21  Is Prior Authorization needed: no  Pt estimated Cost:$279.51     Spoke with patient, advised as seen above. Advised this is an estimate of benefits, will need to sign Prolia waiver, patient agreeable and request to schedule. Prolia scheduled for 09/11/22 at 0815.   Routing to provider for final review. Patient is agreeable to disposition. Will close encounter.

## 2022-09-11 ENCOUNTER — Ambulatory Visit (INDEPENDENT_AMBULATORY_CARE_PROVIDER_SITE_OTHER): Payer: Medicare Other | Admitting: *Deleted

## 2022-09-11 DIAGNOSIS — M810 Age-related osteoporosis without current pathological fracture: Secondary | ICD-10-CM

## 2022-09-24 ENCOUNTER — Ambulatory Visit
Admission: RE | Admit: 2022-09-24 | Discharge: 2022-09-24 | Disposition: A | Discharge status: Home / Self Care | Payer: Medicare Other | Source: Ambulatory Visit | Attending: Physician Assistant | Admitting: Physician Assistant

## 2022-09-24 DIAGNOSIS — Z1231 Encounter for screening mammogram for malignant neoplasm of breast: Secondary | ICD-10-CM

## 2022-09-30 ENCOUNTER — Other Ambulatory Visit: Payer: Self-pay | Admitting: Nurse Practitioner

## 2022-10-01 ENCOUNTER — Telehealth: Payer: Self-pay

## 2022-10-01 NOTE — Telephone Encounter (Signed)
Called patient to give her information from [place], NP. Patient was wondering about osteoporosis drug Jubbonti. Per Elmarie Shiley, it is FDA approved but not available for prescribing. She stated that they can look into it later. Coverage for it may be the issue.  No answer. Will try call again.

## 2022-10-01 NOTE — Telephone Encounter (Signed)
Patient notified. She will review it again at her next annual exam to see if there are any updates on it.

## 2022-11-04 ENCOUNTER — Telehealth: Payer: Self-pay | Admitting: Cardiology

## 2022-11-04 NOTE — Telephone Encounter (Signed)
Pt c/o medication issue:  1. Name of Medication: losartan-hydrochlorothiazide (HYZAAR) 100-12.5 MG tablet   verapamil (VERELAN) 180 MG 24 hr capsule   2. How are you currently taking this medication (dosage and times per day)?    3. Are you having a reaction (difficulty breathing--STAT)? no  4. What is your medication issue? Patient found out she has skin cancer, and her dr is worried about her taking above medication. Please advise

## 2022-11-04 NOTE — Telephone Encounter (Signed)
Returned call to pt in regards to her medication and cancer. Pt had melanoma treatment a month ago. She states she has had some sort of basil or squamous cell cancer since she was in high school. She sees her derm every three months due to this and now she has "graduated" to melanoma and her derm doctor and her are concerned about her taking Hyzaar as one side effect can be skin cancer. They are wondering with her exacerbation of the cancer if the med is contributing and would like some advice or recommendation if she should stay on it or if there is something else she should take. Please advise.  Please leave a detailed message if she does not answer.

## 2022-11-05 NOTE — Telephone Encounter (Signed)
Spoke with pt, Aware of dr Ludwig Clarks recommendations. Aware it is the hydrochlorothiazide that makes the skin more sensitive to the sun and she is aware if we stop the hydrochlorothiazide will need to monitor her bp for changes. She has decided to continue the hyzaar for now.

## 2023-02-07 ENCOUNTER — Other Ambulatory Visit: Payer: Self-pay | Admitting: *Deleted

## 2023-02-07 DIAGNOSIS — Z780 Asymptomatic menopausal state: Secondary | ICD-10-CM

## 2023-02-07 DIAGNOSIS — M810 Age-related osteoporosis without current pathological fracture: Secondary | ICD-10-CM

## 2023-03-04 ENCOUNTER — Telehealth: Payer: Self-pay | Admitting: *Deleted

## 2023-03-04 NOTE — Telephone Encounter (Signed)
Insurance information submitted to [employer] portal. Will await summary of benefits for prolia.    

## 2023-04-07 NOTE — Telephone Encounter (Signed)
 April from [facility] left a voicemail on prolia  line stating that this patient would like to continue prolia  injections and BMD scans at PCP. Requesting medical records be faxed to their office. Advised would sent message to [facility] who handles medical records in our office.   Encounter closed.

## 2023-04-10 ENCOUNTER — Ambulatory Visit: Payer: Medicare Other | Admitting: Nurse Practitioner

## 2023-04-14 ENCOUNTER — Ambulatory Visit (INDEPENDENT_AMBULATORY_CARE_PROVIDER_SITE_OTHER): Payer: Medicare Other | Admitting: Nurse Practitioner

## 2023-04-14 ENCOUNTER — Encounter: Payer: Self-pay | Admitting: Nurse Practitioner

## 2023-04-14 VITALS — BP 132/86 | HR 88 | Resp 16 | Ht 64.0 in | Wt 175.0 lb

## 2023-04-14 DIAGNOSIS — M810 Age-related osteoporosis without current pathological fracture: Secondary | ICD-10-CM | POA: Diagnosis not present

## 2023-04-14 DIAGNOSIS — Z01419 Encounter for gynecological examination (general) (routine) without abnormal findings: Secondary | ICD-10-CM

## 2023-04-14 DIAGNOSIS — Z780 Asymptomatic menopausal state: Secondary | ICD-10-CM | POA: Diagnosis not present

## 2023-04-14 NOTE — Progress Notes (Signed)
Robin [name] 1955/08/20 [id number]   History:  68 y.o. G0 presents for annual exam. Postmenopausal - no HRT, no bleeding. Normal pap history. History of atypical ductal hyperplasia in 2017, actively follows with Dr. Darnelle Catalan. Osteoporosis - on Prolia. History of HTN, hypothyroidism, Crohn's, basal, squamous and melanoma cell skin cancer - sees derm every 3 months. Lost IUD in 1983, unable to retrieve and left in place.   Gynecologic History No LMP recorded. Patient is postmenopausal.   Contraception/Family planning: post menopausal status Sexually active: No  Health Maintenance Last Pap: 02/22/2021. Results were: Normal Last mammogram: 09/24/2022. Results were: Normal Last colonoscopy: 06/21/2021, 2-year recall d/t Crohn's Last Dexa: 04/24/2021. Results were: T-score -2.1  Past medical history, past surgical history, family history and social history were all reviewed and documented in the EPIC chart. Married. Works for [employer] ([employer]   ROS:  A ROS was performed and pertinent positives and negatives are included.  Exam:  Vitals:   04/14/23 0802  BP: 132/86  Pulse: 88  Resp: 16  Weight: 175 lb (79.4 kg)  Height: 5\' 4"  (1.626 m)     Body mass index is 30.04 kg/m.  General appearance:  Normal Thyroid:  Symmetrical, normal in size, without palpable masses or nodularity. Respiratory  Auscultation:  Clear without wheezing or rhonchi Cardiovascular  Auscultation:  Regular rate, without rubs, murmurs or gallops  Edema/varicosities:  Not grossly evident Abdominal  Soft,nontender, without masses, guarding or rebound.  Liver/spleen:  No organomegaly noted  Hernia:  None appreciated  Skin  Inspection:  Grossly normal. Many nevi, keratoses (sees derm) Breasts: Examined lying and sitting.   Right: Without masses, retractions, nipple discharge or axillary adenopathy.   Left: Without masses, retractions, nipple discharge or axillary adenopathy. Pelvic: External  genitalia:  no lesions              Urethra:  normal appearing urethra with no masses, tenderness or lesions              Bartholins and Skenes: normal                 Vagina: normal appearing vagina with normal color and discharge, no lesions. Atrophic changes              Cervix: no lesions Bimanual Exam:  Uterus:  no masses or tenderness              Adnexa: no mass, fullness, tenderness              Rectovaginal: Deferred              Anus:  normal, no lesions  Patient informed chaperone available to be present for breast and pelvic exam. Patient has requested no chaperone to be present. Patient has been advised what will be completed during breast and pelvic exam.   Assessment/Plan:  68 y.o. G0 for annual exam.   Well female exam with routine gynecological exam - Education provided on SBEs, importance of preventative screenings, current guidelines, high calcium diet, regular exercise, and multivitamin daily.  Labs with PCP.   Postmenopausal - No HRT, no bleeding.  Age-related osteoporosis without current pathological fracture - On Prolia. T-score -2.1 in February 2023. DXA scheduled 3/7. Does have generalized joint pain for a few days after injections. Not interested in switching to alternative at this time.   Screening for cervical cancer - Normal Pap history. No longer screening per guidelines.   Screening for breast cancer - 2017 atypical  ductal hyperplasia, actively follows with Dr. Darnelle Catalan. Continue annual screenings.  Normal breast exam today.  Screening for colon cancer - 05/2021 colonoscopy. Colonoscopy every 1-2 years recommended d/t Crohn's.   Return in about 2 years (around 04/13/2025) for B&P.     Olivia Mackie DNP, 8:47 AM 04/14/2023

## 2023-04-15 ENCOUNTER — Telehealth: Payer: Self-pay | Admitting: *Deleted

## 2023-04-15 NOTE — Telephone Encounter (Signed)
-----   Message from Olivia Mackie sent at 04/14/2023  8:41 AM EST ----- Regarding: Prolia Patient would like to continue Prolia here at our office instead of with PCP. Planning to have DXA 05/02/23 at PCP office. Will plan for injection after this.

## 2023-04-25 ENCOUNTER — Other Ambulatory Visit: Payer: Self-pay | Admitting: Cardiology

## 2023-04-27 ENCOUNTER — Other Ambulatory Visit: Payer: Self-pay | Admitting: Cardiology

## 2023-04-29 ENCOUNTER — Other Ambulatory Visit (HOSPITAL_BASED_OUTPATIENT_CLINIC_OR_DEPARTMENT_OTHER): Payer: Medicare Other

## 2023-06-04 NOTE — Telephone Encounter (Signed)
 April called from [facility], requested date of last Prolia received. Advised last Prolia received 09/11/2022. Copy of MAR dated 09/11/22 for Prolia faxed to April at [phone].   April is asking if previous dates of  Prolia injection can be faxed, copy of Prolia medication Hx also faxed and confirmed.

## 2023-07-28 NOTE — Progress Notes (Signed)
 HPI: Follow-up atrial fibrillation. Patient seen in the emergency room January 2016 with palpitations. She had an electrocardiogram that demonstrated fibrillation and converted spontaneously to sinus rhythm. She had an event monitor 1/16 that did not show evidence of atrial fibrillation. Her echocardiogram in February 2016 showed normal LV function, mild left ventricular hypertrophy, atrial septum aneurysmal. Exercise treadmill April 2017 with no ST changes. Apixaban recommended previously but pt declined. Since last seen, the patient denies any dyspnea on exertion, orthopnea, PND, pedal edema, palpitations, syncope or chest pain.   Current Outpatient Medications  Medication Sig Dispense Refill   calcium carbonate (TUMS - DOSED IN MG ELEMENTAL CALCIUM) 500 MG chewable tablet 1 tablet     denosumab  (PROLIA ) 60 MG/ML SOSY injection INJECT 1 SYRINGE UNDER THE SKIN ONCE EVERY 6 MONTHS 1 mL 0   losartan -hydrochlorothiazide (HYZAAR) 100-12.5 MG tablet Take 1 tablet by mouth daily.     naproxen sodium (ALEVE) 220 MG tablet Take 220 mg by mouth daily as needed.     SYNTHROID  75 MCG tablet Take 1 tablet (75 mcg total) by mouth daily before breakfast. 90 tablet 3   vedolizumab  (ENTYVIO ) 300 MG injection Inject into the vein every 30 (thirty) days.     verapamil  (VERELAN ) 180 MG 24 hr capsule TAKE 2 CAPSULES BY MOUTH EVERY DAY 180 capsule 0   ALPRAZolam  (XANAX ) 0.25 MG tablet Take 0.175 mg by mouth as needed.     No current facility-administered medications for this visit.     Past Medical History:  Diagnosis Date   Anemia    Anxiety    Arthritis    Asthma    seasonal   Atypical lobular hyperplasia (ALH) of left breast 07/2015   Cancer (HCC)    skin   Cataract    FORMING   Crohn's disease (HCC)    severe colonic stricturing   GERD (gastroesophageal reflux disease)    PAST HX   Hypertension    Hypothyroidism    IUD (intrauterine device) in place 1983   Safety coil per Dr. Alecia Huntsman  note attempted to retrieve but unable to do so   Osteoporosis 12/2016   T score -2.7 stable from prior DEXA on Prolia    PAF (paroxysmal atrial fibrillation) (HCC)    Thyroid  disease    Hypothyroid    Past Surgical History:  Procedure Laterality Date   Basal and squamous cell Ca excised     BREAST BIOPSY Left 2018   BREAST EXCISIONAL BIOPSY Left 2018   COLONOSCOPY N/A 07/01/2013   Procedure: COLONOSCOPY;  Surgeon: Garrett Kallman, MD;  Location: [facility];  Service: Endoscopy;  Laterality: N/A;   COLONOSCOPY     Fissure surg     RADIOACTIVE SEED GUIDED EXCISIONAL BREAST BIOPSY Left 09/25/2015   Procedure: RADIOACTIVE SEED GUIDED EXCISIONAL BREAST BIOPSY;  Surgeon: Enid Harry, MD;  Location: [facility];  Service: General;  Laterality: Left;   TRIGGER FINGER RELEASE Right 02/01/2016   Procedure: RELEASE TRIGGER FINGER/A-1 PULLEY, right thumb;  Surgeon: Lyanne Sample, MD;  Location: [facility];  Service: Orthopedics;  Laterality: Right;  FAB    Social History   Socioeconomic History   Marital status: Married    Spouse name: Not on file   Number of children: Not on file   Years of education: Not on file   Highest education level: Not on file  Occupational History   Not on file  Tobacco Use   Smoking status: Never  Smokeless tobacco: Never  Vaping Use   Vaping status: Never Used  Substance and Sexual Activity   Alcohol use: Yes    Comment: Occasional   Drug use: No   Sexual activity: Not Currently    Birth control/protection: Post-menopausal    Comment: Pt. declined sexual hx questions, DES unknown  Other Topics Concern   Not on file  Social History Narrative   Not on file   Social Drivers of Health   Financial Resource Strain: Not on file  Food Insecurity: Not on file  Transportation Needs: Not on file  Physical Activity: Not on file  Stress: Not on file  Social Connections: Not on file  Intimate Partner Violence: Not on file     Family History  Problem Relation Age of Onset   Colon cancer Mother        DX'D AGE 31'S   Heart disease Mother        Atrial fibrillation   Crohn's disease Mother    Heart disease Maternal Grandmother    Hypertension Maternal Grandmother    Breast cancer Paternal Grandmother        age unknown   Esophageal cancer Neg Hx    Liver cancer Neg Hx    Pancreatic cancer Neg Hx    Rectal cancer Neg Hx    Stomach cancer Neg Hx    Colon polyps Neg Hx     ROS: no fevers or chills, productive cough, hemoptysis, dysphasia, odynophagia, melena, hematochezia, dysuria, hematuria, rash, seizure activity, orthopnea, PND, pedal edema, claudication. Remaining systems are negative.  Physical Exam: Well-developed well-nourished in no acute distress.  Skin is warm and dry.  HEENT is normal.  Neck is supple.  Chest is clear to auscultation with normal expansion.  Cardiovascular exam is regular rate and rhythm.  Abdominal exam nontender or distended. No masses palpated. Extremities show no edema. neuro grossly intact  EKG Interpretation Date/Time:  Monday August 11 2023 08:49:58 EDT Ventricular Rate:  79 PR Interval:  138 QRS Duration:  76 QT Interval:  366 QTC Calculation: 419 R Axis:   24  Text Interpretation: Normal sinus rhythm Normal ECG Confirmed by Alexandria Angel ([id number]) on 08/11/2023 9:06:11 AM    A/P  1 paroxysmal atrial fibrillation-patient remains in sinus rhythm.  Continue verapamil  for rate control of atrial fibrillation recurs.  Previously declined anticoagulation given history of Crohn's/risk of GI bleed.  She understands the high risk of embolic event off of anticoagulation.  Also note her only documented episode was in 2016.  2 hypertension-patient's blood pressure is controlled.  Continue present medications. Check BMET.  Alexandria Angel, MD

## 2023-07-29 ENCOUNTER — Other Ambulatory Visit: Payer: Self-pay | Admitting: Cardiology

## 2023-08-11 ENCOUNTER — Encounter: Payer: Self-pay | Admitting: Cardiology

## 2023-08-11 ENCOUNTER — Ambulatory Visit: Attending: Cardiology | Admitting: Cardiology

## 2023-08-11 DIAGNOSIS — I480 Paroxysmal atrial fibrillation: Secondary | ICD-10-CM | POA: Insufficient documentation

## 2023-08-11 NOTE — Patient Instructions (Signed)

## 2023-08-12 ENCOUNTER — Ambulatory Visit: Payer: Self-pay | Admitting: Cardiology

## 2023-08-12 LAB — BASIC METABOLIC PANEL WITH GFR
BUN/Creatinine Ratio: 18 (ref 12–28)
BUN: 14 mg/dL (ref 8–27)
CO2: 21 mmol/L (ref 20–29)
Calcium: 9.7 mg/dL (ref 8.7–10.3)
Chloride: 104 mmol/L (ref 96–106)
Creatinine, Ser: 0.8 mg/dL (ref 0.57–1.00)
Glucose: 88 mg/dL (ref 70–99)
Potassium: 5.2 mmol/L (ref 3.5–5.2)
Sodium: 142 mmol/L (ref 134–144)
eGFR: 80 mL/min/{1.73_m2} (ref >59)

## 2023-08-25 ENCOUNTER — Other Ambulatory Visit: Payer: Self-pay | Admitting: Nurse Practitioner

## 2023-08-25 DIAGNOSIS — Z1231 Encounter for screening mammogram for malignant neoplasm of breast: Secondary | ICD-10-CM

## 2023-09-26 ENCOUNTER — Ambulatory Visit
Admission: RE | Admit: 2023-09-26 | Discharge: 2023-09-26 | Disposition: A | Discharge status: Home / Self Care | Source: Ambulatory Visit | Attending: Nurse Practitioner | Admitting: Nurse Practitioner

## 2023-09-26 DIAGNOSIS — Z1231 Encounter for screening mammogram for malignant neoplasm of breast: Secondary | ICD-10-CM

## 2023-10-01 ENCOUNTER — Other Ambulatory Visit: Payer: Self-pay | Admitting: Nurse Practitioner

## 2023-10-01 DIAGNOSIS — N6310 Unspecified lump in the right breast, unspecified quadrant: Secondary | ICD-10-CM

## 2023-10-02 ENCOUNTER — Ambulatory Visit
Admission: RE | Admit: 2023-10-02 | Discharge: 2023-10-02 | Disposition: A | Discharge status: Home / Self Care | Source: Ambulatory Visit | Attending: Nurse Practitioner | Admitting: Nurse Practitioner

## 2023-10-02 DIAGNOSIS — N6310 Unspecified lump in the right breast, unspecified quadrant: Secondary | ICD-10-CM

## 2023-10-03 ENCOUNTER — Ambulatory Visit: Payer: Self-pay | Admitting: Nurse Practitioner

## 2023-10-26 ENCOUNTER — Other Ambulatory Visit: Payer: Self-pay | Admitting: Cardiology

## 2023-11-20 ENCOUNTER — Encounter: Payer: Self-pay | Admitting: Gastroenterology

## 2023-12-17 NOTE — Progress Notes (Unsigned)
 Chief Complaint: Primary GI MD: Dr. Leigh  HPI:  *** is a  ***  who was referred to me by No ref. provider found for a complaint of *** .    Crohn's History Ileocolonic Crohn's diagnosed > 20 years ago. History of colonic strictures. She has had longstanding untreated diseases for years, historically managed with steroids PRN. Previously followed by Dr. Gladis Louder. Mother had Crohn's. She has never been on any maintenance medications for her disease until July 2019 when she started on Entyvio . History of skin cancers, osteoporosis.   Discussed the use of AI scribe software for clinical note transcription with the patient, who gave verbal consent to proceed.  History of Present Illness      PREVIOUS GI WORKUP   05/01/17 - normal EGD. Colonoscopy with severe Crohn's colitis and associated stricturing - unable to intubate right colon due to stricturing. 10/01/2010 - active Crohn's colitis 2007 - normal ileum, active colitis 1994 - colitis and ileitis   CT enterography - 05/09/2017 - long segment bowel wall thickening descending colon with stenosis in tranverse colon, no small bowel inflammatoin   Colonoscopy 06/21/21:The examined portion of the ileum was normal. - Stricture in the sigmoid colon, in the transverse colon and in the ascending colon. Biopsied. Stable in appearance compared to 2019 - mild inflammatory changes at the strictures but no inflammation elsewhere. - Anal papilla(e) were hypertrophied. Biopsied. - Internal hemorrhoids. - The examination was otherwise normal. - Biopsies for surveillance were taken from the right colon, left colon and transverse colon. Overall stable in appearance and no worsening since 2019 - certainly improved pre-biologic therapy but not in endoscopic remission. Patient has been hesitant to use therapies other than Entyvio  in the past, can follow up in the office and discuss long term options.   1. Surgical [P], ascending colon  stricture REACTIVE COLONIC MUCOSA (SEE MICROSCOPIC COMMENT) NEGATIVE FOR DYSPLASIA AND CARCINOMA 2. Surgical [P], random right colon sites BENIGN COLONIC MUCOSA WITH NO DIAGNOSTIC ABNORMALITY 3. Surgical [P], transverse colon stricture HYPERPLASTIC COLONIC MUCOSA WITH ALTERED CRYPT ARCHITECTURE AND FOCAL ACUTE CRYPTITIS (SEE MICROSCOPIC COMMENT) NEGATIVE FOR DYSPLASIA AND GRANULOMAS 4. Surgical [P], transverse colon BENIGN COLONIC MUCOSA WITH NO DIAGNOSTIC ABNORMALITY 5. Surgical [P], random left colon sites BENIGN COLONIC MUCOSA WITH NO DIAGNOSTIC ABNORMALITY 6. Surgical [P], sigmoid colon stricture CHRONIC, MINIMALLY ACTIVE COLITIS WITH FOCAL ACUTE CRYPTITIS (SEE MICROSCOPIC COMMENT) NEGATIVE FOR DYSPLASIA AND GRANULOMAS 7. Surgical [P], colon, anal papilla BENIGN SQUAMOUS EPITHELIUM NEGATIVE FOR DYSPLASIA AND DIAGNOSTIC VIRAL CHANGE   DEXA scan - 04/24/21 - t score (-) 2.1 - osteopenia - repeat in 2 years  Past Medical History:  Diagnosis Date   Anemia    Anxiety    Arthritis    Asthma    seasonal   Atypical lobular hyperplasia (ALH) of left breast 07/2015   Cancer (HCC)    skin   Cataract    FORMING   Crohn's disease (HCC)    severe colonic stricturing   GERD (gastroesophageal reflux disease)    PAST HX   Hypertension    Hypothyroidism    IUD (intrauterine device) in place 1983   Safety coil per Dr. Genetta note attempted to retrieve but unable to do so   Osteoporosis 12/2016   T score -2.7 stable from prior DEXA on Prolia    PAF (paroxysmal atrial fibrillation) (HCC)    Thyroid  disease    Hypothyroid    Past Surgical History:  Procedure Laterality Date   Basal and  squamous cell Ca excised     BREAST BIOPSY Left 2018   BREAST EXCISIONAL BIOPSY Left 2018   COLONOSCOPY N/A 07/01/2013   Procedure: COLONOSCOPY;  Surgeon: Gladis MARLA Louder, MD;  Location: [facility];  Service: Endoscopy;  Laterality: N/A;   COLONOSCOPY     Fissure surg     RADIOACTIVE SEED  GUIDED EXCISIONAL BREAST BIOPSY Left 09/25/2015   Procedure: RADIOACTIVE SEED GUIDED EXCISIONAL BREAST BIOPSY;  Surgeon: Donnice Bury, MD;  Location: [facility];  Service: General;  Laterality: Left;   TRIGGER FINGER RELEASE Right 02/01/2016   Procedure: RELEASE TRIGGER FINGER/A-1 PULLEY, right thumb;  Surgeon: Arley Curia, MD;  Location: [facility];  Service: Orthopedics;  Laterality: Right;  FAB    Current Outpatient Medications  Medication Sig Dispense Refill   ALPRAZolam  (XANAX ) 0.25 MG tablet Take 0.175 mg by mouth as needed.     calcium carbonate (TUMS - DOSED IN MG ELEMENTAL CALCIUM) 500 MG chewable tablet 1 tablet     denosumab  (PROLIA ) 60 MG/ML SOSY injection INJECT 1 SYRINGE UNDER THE SKIN ONCE EVERY 6 MONTHS 1 mL 0   losartan -hydrochlorothiazide (HYZAAR) 100-12.5 MG tablet Take 1 tablet by mouth daily.     naproxen sodium (ALEVE) 220 MG tablet Take 220 mg by mouth daily as needed.     SYNTHROID  75 MCG tablet Take 1 tablet (75 mcg total) by mouth daily before breakfast. 90 tablet 3   vedolizumab  (ENTYVIO ) 300 MG injection Inject into the vein every 30 (thirty) days.     verapamil  (VERELAN ) 180 MG 24 hr capsule TAKE 2 CAPSULES BY MOUTH EVERY DAY 180 capsule 2   No current facility-administered medications for this visit.    Allergies as of 12/18/2023 - Review Complete 08/11/2023  Allergen Reaction Noted   Codeine Nausea Only 09/18/2015   Tetracyclines & related Diarrhea 07/19/2011   Biaxin [clarithromycin] Palpitations 07/19/2011   Clarithromycin Palpitations 07/20/2010    Family History  Problem Relation Age of Onset   Colon cancer Mother        DX'D AGE 47'S   Heart disease Mother        Atrial fibrillation   Crohn's disease Mother    Heart disease Maternal Grandmother    Hypertension Maternal Grandmother    Breast cancer Paternal Grandmother        age unknown   Esophageal cancer Neg Hx    Liver cancer Neg Hx    Pancreatic cancer  Neg Hx    Rectal cancer Neg Hx    Stomach cancer Neg Hx    Colon polyps Neg Hx     Social History   Socioeconomic History   Marital status: Married    Spouse name: Not on file   Number of children: Not on file   Years of education: Not on file   Highest education level: Not on file  Occupational History   Not on file  Tobacco Use   Smoking status: Never   Smokeless tobacco: Never  Vaping Use   Vaping status: Never Used  Substance and Sexual Activity   Alcohol use: Yes    Comment: Occasional   Drug use: No   Sexual activity: Not Currently    Birth control/protection: Post-menopausal    Comment: Pt. declined sexual hx questions, DES unknown  Other Topics Concern   Not on file  Social History Narrative   Not on file   Social Drivers of Health   Financial Resource Strain: Not on file  Food Insecurity: Not on file  Transportation Needs: Not on file  Physical Activity: Not on file  Stress: Not on file  Social Connections: Not on file  Intimate Partner Violence: Not on file    Review of Systems:    Constitutional: No weight loss, fever, chills, weakness or fatigue HEENT: Eyes: No change in vision               Ears, Nose, Throat:  No change in hearing or congestion Skin: No rash or itching Cardiovascular: No chest pain, chest pressure or palpitations   Respiratory: No SOB or cough Gastrointestinal: See HPI and otherwise negative Genitourinary: No dysuria or change in urinary frequency Neurological: No headache, dizziness or syncope Musculoskeletal: No new muscle or joint pain Hematologic: No bleeding or bruising Psychiatric: No history of depression or anxiety    Physical Exam:  Vital signs: There were no vitals taken for this visit.  Constitutional: NAD, alert and cooperative Head:  Normocephalic and atraumatic. Eyes:   PEERL, EOMI. No icterus. Conjunctiva pink. Respiratory: Respirations even and unlabored. Lungs clear to auscultation bilaterally.   No  wheezes, crackles, or rhonchi.  Cardiovascular:  Regular rate and rhythm. No peripheral edema, cyanosis or pallor.  Gastrointestinal:  Soft, nondistended, nontender. No rebound or guarding. Normal bowel sounds. No appreciable masses or hepatomegaly. Rectal:  Declines Msk:  Symmetrical without gross deformities. Without edema, no deformity or joint abnormality.  Neurologic:  Alert and  oriented x4;  grossly normal neurologically.  Skin:   Dry and intact without significant lesions or rashes. Psychiatric: Oriented to person, place and time. Demonstrates good judgement and reason without abnormal affect or behaviors.  Physical Exam    RELEVANT LABS AND IMAGING: CBC    Component Value Date/Time   WBC 9.2 08/21/2020 0941   WBC 11.2 (H) 05/05/2017 1035   RBC 4.34 08/21/2020 0941   HGB 12.7 08/21/2020 0941   HCT 39.3 08/21/2020 0941   PLT 434 (H) 08/21/2020 0941   MCV 90.6 08/21/2020 0941   MCH 29.3 08/21/2020 0941   MCHC 32.3 08/21/2020 0941   RDW 13.1 08/21/2020 0941   LYMPHSABS 2.9 08/21/2020 0941   MONOABS 0.8 08/21/2020 0941   EOSABS 0.2 08/21/2020 0941   BASOSABS 0.1 08/21/2020 0941    CMP     Component Value Date/Time   NA 142 08/11/2023 0958   K 5.2 08/11/2023 0958   CL 104 08/11/2023 0958   CO2 21 08/11/2023 0958   GLUCOSE 88 08/11/2023 0958   GLUCOSE 93 08/21/2020 0941   BUN 14 08/11/2023 0958   CREATININE 0.80 08/11/2023 0958   CREATININE 0.84 08/21/2020 0941   CALCIUM 9.7 08/11/2023 0958   PROT 7.3 08/21/2020 0941   ALBUMIN 3.8 08/21/2020 0941   AST 25 08/21/2020 0941   ALT 16 08/21/2020 0941   ALKPHOS 55 08/21/2020 0941   BILITOT 0.5 08/21/2020 0941   GFRNONAA >60 08/21/2020 0941   GFRAA >60 08/19/2019 1332     Assessment/Plan:   Assessment and Plan Assessment & Plan    Crohn's Disease History of stricturing that is chronic. On Entyvio  every 4 weeks. Prefers colonoscopy every 2 years versus annually. Last colonoscopy 2023 with stricture in  ascending, sigmoid, transverse colon stable since 2019 but improved since biologic therapy. No previous CRP/ESR, and fecal calprotectin -- repeat colonoscopy -- fecal calprotectin to have baseline to monitor annually -- CRP/ESR -- Continue Entyvio     Normal Recinos Mollie RIGGERS [facility] 12/17/2023, 12:51 PM  Cc: No ref. provider  found

## 2023-12-18 ENCOUNTER — Ambulatory Visit: Admitting: Gastroenterology

## 2023-12-18 ENCOUNTER — Encounter: Payer: Self-pay | Admitting: Gastroenterology

## 2023-12-18 VITALS — BP 122/68 | HR 85 | Ht 65.0 in | Wt 175.0 lb

## 2023-12-18 DIAGNOSIS — K50119 Crohn's disease of large intestine with unspecified complications: Secondary | ICD-10-CM | POA: Diagnosis not present

## 2023-12-18 MED ORDER — SUPREP BOWEL PREP KIT 17.5-3.13-1.6 GM/177ML PO SOLN: 1.0000 | Freq: Once | ORAL | 0 refills | Status: AC

## 2023-12-18 NOTE — Progress Notes (Signed)
 Agree with assessment and plan as outlined.

## 2023-12-18 NOTE — Patient Instructions (Signed)
 We have sent the following medications to your pharmacy for you to pick up at your convenience: SUPREP  You have been scheduled for a colonoscopy. Please follow written instructions given to you at your visit today.   If you use inhalers (even only as needed), please bring them with you on the day of your procedure.  DO NOT TAKE 7 DAYS PRIOR TO TEST- Trulicity (dulaglutide) Ozempic, Wegovy (semaglutide) Mounjaro (tirzepatide) Bydureon Bcise (exanatide extended release)  DO NOT TAKE 1 DAY PRIOR TO YOUR TEST Rybelsus (semaglutide) Adlyxin (lixisenatide) Victoza (liraglutide) Byetta (exanatide) ___________________________________________________________________________  Due to recent changes in healthcare laws, you may see the results of your imaging and laboratory studies on MyChart before your provider has had a chance to review them.  We understand that in some cases there may be results that are confusing or concerning to you. Not all laboratory results come back in the same time frame and the provider may be waiting for multiple results in order to interpret others.  Please give us  48 hours in order for your provider to thoroughly review all the results before contacting the office for clarification of your results.   _______________________________________________________  If your blood pressure at your visit was 140/90 or greater, please contact your primary care physician to follow up on this.  _______________________________________________________  If you are age 31 or older, your body mass index should be between 23-30. Your Body mass index is 29.12 kg/m. If this is out of the aforementioned range listed, please consider follow up with your Primary Care Provider.  If you are age 55 or younger, your body mass index should be between 19-25. Your Body mass index is 29.12 kg/m. If this is out of the aformentioned range listed, please consider follow up with your Primary Care  Provider.   ________________________________________________________  The [facility] providers would like to encourage you to use MYCHART to communicate with providers for non-urgent requests or questions.  Due to long hold times on the telephone, sending your provider a message by [facility] may be a faster and more efficient way to get a response.  Please allow 48 business hours for a response.  Please remember that this is for non-urgent requests.  _______________________________________________________  [facility] is using a team-based approach to care.  Your team is made up of your doctor and two to three APPS. Our APPS (Nurse Practitioners and Physician Assistants) work with your physician to ensure care continuity for you. They are fully qualified to address your health concerns and develop a treatment plan. They communicate directly with your gastroenterologist to care for you. Seeing the Advanced Practice Practitioners on your physician's team can help you by facilitating care more promptly, often allowing for earlier appointments, access to diagnostic testing, procedures, and other specialty referrals.

## 2024-02-10 ENCOUNTER — Ambulatory Visit: Admitting: Gastroenterology

## 2024-02-10 ENCOUNTER — Encounter: Payer: Self-pay | Admitting: Gastroenterology

## 2024-02-10 VITALS — BP 123/64 | HR 68 | Temp 98.2°F | Resp 20 | Ht 65.0 in | Wt 175.0 lb

## 2024-02-10 DIAGNOSIS — K529 Noninfective gastroenteritis and colitis, unspecified: Secondary | ICD-10-CM | POA: Diagnosis not present

## 2024-02-10 DIAGNOSIS — K50119 Crohn's disease of large intestine with unspecified complications: Secondary | ICD-10-CM

## 2024-02-10 DIAGNOSIS — D122 Benign neoplasm of ascending colon: Secondary | ICD-10-CM

## 2024-02-10 DIAGNOSIS — K635 Polyp of colon: Secondary | ICD-10-CM | POA: Diagnosis not present

## 2024-02-10 DIAGNOSIS — K5190 Ulcerative colitis, unspecified, without complications: Secondary | ICD-10-CM | POA: Diagnosis not present

## 2024-02-10 MED ORDER — SODIUM CHLORIDE 0.9 % IV SOLN: 500.0000 mL | Freq: Once | INTRAVENOUS | Status: DC

## 2024-02-10 NOTE — Progress Notes (Signed)
 Vss nad trans to pacu

## 2024-02-10 NOTE — Progress Notes (Signed)
 I have reviewed the patient's medical history in detail and updated the computerized patient record.

## 2024-02-10 NOTE — Progress Notes (Signed)
 Called to room to assist during endoscopic procedure.  Patient ID and intended procedure confirmed with present staff. Received instructions for my participation in the procedure from the performing physician.

## 2024-02-10 NOTE — Patient Instructions (Addendum)
Discharge instructions given. Handouts on polyps. Resume previous medications. YOU HAD AN ENDOSCOPIC PROCEDURE TODAY AT THE [facility]:   Refer to the procedure report that was given to you for any specific questions about what was found during the examination.  If the procedure report does not answer your questions, please call your gastroenterologist to clarify.  If you requested that your care partner not be given the details of your procedure findings, then the procedure report has been included in a sealed envelope for you to review at your convenience later.  YOU SHOULD EXPECT: Some feelings of bloating in the abdomen. Passage of more gas than usual.  Walking can help get rid of the air that was put into your GI tract during the procedure and reduce the bloating. If you had a lower endoscopy (such as a colonoscopy or flexible sigmoidoscopy) you may notice spotting of blood in your stool or on the toilet paper. If you underwent a bowel prep for your procedure, you may not have a normal bowel movement for a few days.  Please Note:  You might notice some irritation and congestion in your nose or some drainage.  This is from the oxygen used during your procedure.  There is no need for concern and it should clear up in a day or so.  SYMPTOMS TO REPORT IMMEDIATELY:  Following lower endoscopy (colonoscopy or flexible sigmoidoscopy):  Excessive amounts of blood in the stool  Significant tenderness or worsening of abdominal pains  Swelling of the abdomen that is new, acute  Fever of 100F or higher   For urgent or emergent issues, a gastroenterologist can be reached at any hour by calling [phone]. Do not use MyChart messaging for urgent concerns.    DIET:  We do recommend a small meal at first, but then you may proceed to your regular diet.  Drink plenty of fluids but you should avoid alcoholic beverages for 24 hours.  ACTIVITY:  You should plan to take it easy for the rest  of today and you should NOT DRIVE or use heavy machinery until tomorrow (because of the sedation medicines used during the test).    FOLLOW UP: Our staff will call the number listed on your records the next business day following your procedure.  We will call around 7:15- 8:00 am to check on you and address any questions or concerns that you may have regarding the information given to you following your procedure. If we do not reach you, we will leave a message.     If any biopsies were taken you will be contacted by phone or by letter within the next 1-3 weeks.  Please call us at [phone] if you have not heard about the biopsies in 3 weeks.    SIGNATURES/CONFIDENTIALITY: You and/or your care partner have signed paperwork which will be entered into your electronic medical record.  These signatures attest to the fact that that the information above on your After Visit Summary has been reviewed and is understood.  Full responsibility of the confidentiality of this discharge information lies with you and/or your care-partner.

## 2024-02-10 NOTE — Op Note (Signed)
 [facility] Patient Name: Robin [name] Procedure Date: 02/10/2024 9:24 AM MRN: [id number] Endoscopist: Elspeth P. Leigh , MD, [id number] Age: 68 Referring MD:  Date of Birth: [birth year] Gender: Female Account #: [id number] Procedure:                Colonoscopy Indications:              Disease activity assessment of Crohn's disease of                            the colon - longstanding Crohn's disease with                            colonic strictures. On Entyvio  every 4 weeks -                            clinically good control of symptoms. She has had                            mild inflammation at strictures in the past, has                            not wanted to escalate therapy. Medicines:                Monitored Anesthesia Care Procedure:                Pre-Anesthesia Assessment:                           - Prior to the procedure, a History and Physical                            was performed, and patient medications and                            allergies were reviewed. The patient's tolerance of                            previous anesthesia was also reviewed. The risks                            and benefits of the procedure and the sedation                            options and risks were discussed with the patient.                            All questions were answered, and informed consent                            was obtained. Prior Anticoagulants: The patient has                            taken no anticoagulant or antiplatelet agents. ASA  Grade Assessment: III - A patient with severe                            systemic disease. After reviewing the risks and                            benefits, the patient was deemed in satisfactory                            condition to undergo the procedure.                           After obtaining informed consent, the colonoscope                            was passed under direct  vision. Throughout the                            procedure, the patient's blood pressure, pulse, and                            oxygen saturations were monitored continuously. The                            Olympus Scope [id number] was introduced through the                            anus and advanced to the the terminal ileum, with                            identification of the appendiceal orifice and IC                            valve. The colonoscopy was performed without                            difficulty. The patient tolerated the procedure                            well. The quality of the bowel preparation was                            adequate. The terminal ileum, ileocecal valve,                            appendiceal orifice, and rectum were photographed. Scope In: 9:38:28 AM Scope Out: 9:55:59 AM Scope Withdrawal Time: 0 hours 14 minutes 40 seconds  Total Procedure Duration: 0 hours 17 minutes 31 seconds  Findings:                 The perianal and digital rectal examinations were                            normal.  The terminal ileum appeared normal.                           Two flat polyps were found in the ascending colon.                            The polyps were 3 to 8 mm in size. These polyps                            were removed with a cold snare. Resection and                            retrieval were complete.                           A benign-appearing, intrinsic moderate stenosis                            measuring less than one cm (in length) was found in                            the proximal ascending colon, just distal to the IC                            valve. Easily traversed. Mild inflammation there.                            Biopsies were taken with a cold forceps for                            histology.                           A benign-appearing, intrinsic stenosis was found in                            the  sigmoid colon and in the transverse colon. No                            active inflammation noted at either stricture.                            Sigmoid stricture had some luminal narrowing                            compared to the last exam but able to be traversed.                            Very mild / superficial endoscopic trauma noted                            upon withdrawal at the sigmoid stricture. Biopsies  were taken with a cold forceps for histology.                           Anal papilla(e) were hypertrophied.                           Scarring from prior inflammation is noted in the                            transverse and left colon. The exam was otherwise                            normal throughout the examined colon. No active                            inflammation seen.                           Biopsies were taken with a cold forceps for                            histology of the right / transverse / left colon. Complications:            No immediate complications. Estimated blood loss:                            Minimal. Estimated Blood Loss:     Estimated blood loss was minimal. Impression:               - The examined portion of the ileum was normal.                           - Two 3 to 8 mm polyps in the ascending colon,                            removed with a cold snare. Resected and retrieved.                           - Stricture in the ascending colon. Biopsied.                           - Stricture in the sigmoid colon and in the                            transverse colon. Biopsied.                           - Anal papilla(e) were hypertrophied.                           - Normal colon otherwise.                           - Biopsies were taken with a cold forceps for  histology.                           Overall, no active inflammation anywhere other than                            mild activity at  ascending colon stricture. Recommendation:           - Patient has a contact number available for                            emergencies. The signs and symptoms of potential                            delayed complications were discussed with the                            patient. Return to normal activities tomorrow.                            Written discharge instructions were provided to the                            patient.                           - Resume previous diet.                           - Continue present medications including Entyvio .                           - Await pathology results. Elspeth P. Gordon Vandunk, MD 02/10/2024 10:04:37 AM This report has been signed electronically.

## 2024-02-10 NOTE — Progress Notes (Signed)
 [facility] History and Physical   Primary Care Physician:  Delice Charleston, MD (Inactive)   Reason for Procedure:   Crohn's disease - colon involvement  Plan:    colonoscopy     HPI: Robin [name] is a 68 y.o. female  here for colonoscopy surveillance - longstanding Crohn's colitis, on Entyvio  q 4 weeks. History of colonic strictures.   . Patient denies any bowel symptoms at this time. Mother had colon cancer. Otherwise feels well without any cardiopulmonary symptoms.   I have discussed risks / benefits of anesthesia and endoscopic procedure with [name] and they wish to proceed with the exams as outlined today.   The patient was provided an opportunity to ask questions and all were answered. The patient agreed with the plan.    Past Medical History:  Diagnosis Date   Anemia    Anxiety    Arthritis    Asthma    seasonal   Atypical lobular hyperplasia (ALH) of left breast 07/2015   Cancer (HCC)    skin   Cataract    FORMING   Crohn's disease (HCC)    severe colonic stricturing   GERD (gastroesophageal reflux disease)    PAST HX   Hypertension    Hypothyroidism    IUD (intrauterine device) in place 1983   Safety coil per Dr. Genetta note attempted to retrieve but unable to do so   Osteoporosis 12/2016   T score -2.7 stable from prior DEXA on Prolia    PAF (paroxysmal atrial fibrillation) (HCC)    Thyroid  disease    Hypothyroid    Past Surgical History:  Procedure Laterality Date   Basal and squamous cell Ca excised     BREAST BIOPSY Left 2018   BREAST EXCISIONAL BIOPSY Left 2018   COLONOSCOPY N/A 07/01/2013   Procedure: COLONOSCOPY;  Surgeon: Gladis MARLA Louder, MD;  Location: [facility];  Service: Endoscopy;  Laterality: N/A;   COLONOSCOPY     Fissure surg     RADIOACTIVE SEED GUIDED EXCISIONAL BREAST BIOPSY Left 09/25/2015   Procedure: RADIOACTIVE SEED GUIDED EXCISIONAL BREAST BIOPSY;  Surgeon: Donnice Bury, MD;  Location: [facility];  Service: General;  Laterality: Left;   TRIGGER FINGER RELEASE Right 02/01/2016   Procedure: RELEASE TRIGGER FINGER/A-1 PULLEY, right thumb;  Surgeon: Arley Curia, MD;  Location: [facility];  Service: Orthopedics;  Laterality: Right;  FAB    Prior to Admission medications  Medication Sig Start Date End Date Taking? Authorizing Provider  calcium carbonate (TUMS - DOSED IN MG ELEMENTAL CALCIUM) 500 MG chewable tablet 1 tablet   Yes [provider]  calcium-vitamin D  (OSCAL WITH D) 500-5 MG-MCG tablet Take 1 tablet by mouth.   Yes [provider]  losartan -hydrochlorothiazide (HYZAAR) 100-12.5 MG tablet Take 1 tablet by mouth daily. 06/22/20  Yes [provider]  SYNTHROID  75 MCG tablet Take 1 tablet (75 mcg total) by mouth daily before breakfast. 09/28/13  Yes Eletha Boas, MD  verapamil  (VERELAN ) 180 MG 24 hr capsule TAKE 2 CAPSULES BY MOUTH EVERY DAY 10/28/23  Yes Pietro Redell RAMAN, MD  ALPRAZolam  (XANAX ) 0.25 MG tablet Take 0.175 mg by mouth as needed.    [provider]  denosumab  (PROLIA ) 60 MG/ML SOSY injection INJECT 1 SYRINGE UNDER THE SKIN ONCE EVERY 6 MONTHS 08/13/22   Prentiss Riggs A, NP  naproxen sodium (ALEVE) 220 MG tablet Take 220 mg by mouth daily as needed.    [provider]  vedolizumab  (ENTYVIO ) 300 MG injection Inject into the vein every 30 (thirty) days.    [provider]    Current Outpatient Medications  Medication Sig Dispense Refill   calcium carbonate (TUMS - DOSED IN MG ELEMENTAL CALCIUM) 500 MG chewable tablet 1 tablet     calcium-vitamin D  (OSCAL WITH D) 500-5 MG-MCG tablet Take 1 tablet by mouth.     losartan -hydrochlorothiazide (HYZAAR) 100-12.5 MG tablet Take 1 tablet by mouth daily.     SYNTHROID  75 MCG tablet Take 1 tablet (75 mcg total) by mouth daily before breakfast. 90 tablet 3   verapamil  (VERELAN ) 180 MG 24 hr capsule TAKE 2 CAPSULES BY MOUTH EVERY DAY 180 capsule 2    ALPRAZolam  (XANAX ) 0.25 MG tablet Take 0.175 mg by mouth as needed.     denosumab  (PROLIA ) 60 MG/ML SOSY injection INJECT 1 SYRINGE UNDER THE SKIN ONCE EVERY 6 MONTHS 1 mL 0   naproxen sodium (ALEVE) 220 MG tablet Take 220 mg by mouth daily as needed.     vedolizumab  (ENTYVIO ) 300 MG injection Inject into the vein every 30 (thirty) days.     Current Facility-Administered Medications  Medication Dose Route Frequency Provider Last Rate Last Admin   0.9 %  sodium chloride  infusion  500 mL Intravenous Once Aimar Shrewsbury, Elspeth SQUIBB, MD        Allergies as of 02/10/2024 - Review Complete 02/10/2024  Allergen Reaction Noted   Biaxin [clarithromycin] Palpitations 07/19/2011   Clarithromycin Palpitations 07/20/2010   Tetracyclines & related Diarrhea 07/19/2011   Codeine Nausea Only 09/18/2015    Family History  Problem Relation Age of Onset   Colon cancer Mother        DX'D AGE 41'S   Heart disease Mother        Atrial fibrillation   Crohn's disease Mother    Heart disease Maternal Grandmother    Hypertension Maternal Grandmother    Breast cancer Paternal Grandmother        age unknown   Esophageal cancer Neg Hx    Liver cancer Neg Hx    Pancreatic cancer Neg Hx    Rectal cancer Neg Hx    Stomach cancer Neg Hx    Colon polyps Neg Hx     Social History   Socioeconomic History   Marital status: Married    Spouse name: Not on file   Number of children: Not on file   Years of education: Not on file   Highest education level: Not on file  Occupational History   Not on file  Tobacco Use   Smoking status: Never   Smokeless tobacco: Never  Vaping Use   Vaping status: Never Used  Substance and Sexual Activity   Alcohol use: Yes    Comment: Occasional   Drug use: No   Sexual activity: Not Currently    Birth control/protection: Post-menopausal    Comment: Pt. declined sexual hx questions, DES unknown  Other Topics Concern   Not on file  Social History Narrative   Not on file    Social Drivers of Health   Tobacco Use: Low Risk (02/10/2024)   Patient History    Smoking Tobacco Use: Never    Smokeless Tobacco Use: Never    Passive Exposure: Not on file  Financial Resource Strain: Not on file  Food Insecurity: Not on file  Transportation Needs: Not on file  Physical Activity: Not on file  Stress: Not on file  Social Connections: Not on file  Intimate Partner  Violence: Not on file  Depression (EYV7-0): Not on file  Alcohol Screen: Not on file  Housing: Not on file  Utilities: Not on file  Health Literacy: Not on file    Review of Systems: All other review of systems negative except as mentioned in the HPI.  Physical Exam: Vital signs BP (!) 141/89   Pulse 80   Temp 98.2 F (36.8 C) (Temporal)   Ht 5' 5 (1.651 m)   Wt 175 lb (79.4 kg)   SpO2 97%   BMI 29.12 kg/m   General:   Alert,  Well-developed, pleasant and cooperative in NAD Lungs:  Clear throughout to auscultation.   Heart:  Regular rate and rhythm Abdomen:  Soft, nontender and nondistended.   Neuro/Psych:  Alert and cooperative. Normal mood and affect. A and O x 3  Marcey Naval, MD [facility]

## 2024-02-11 ENCOUNTER — Telehealth: Payer: Self-pay | Admitting: Lactation Services

## 2024-02-11 NOTE — Telephone Encounter (Signed)
 No answer left voice mail

## 2024-02-12 ENCOUNTER — Ambulatory Visit: Payer: Self-pay | Admitting: Gastroenterology

## 2024-02-12 LAB — SURGICAL PATHOLOGY

## 2024-04-19 ENCOUNTER — Encounter: Payer: BLUE CROSS/BLUE SHIELD | Admitting: Nurse Practitioner

## 2024-08-05 ENCOUNTER — Ambulatory Visit: Admitting: Cardiology
